# Patient Record
Sex: Male | Born: 1945
Health system: Southern US, Community
[De-identification: ages and names within clinical notes are randomized; demographics above are authoritative.]

## PROBLEM LIST (undated history)

## (undated) DIAGNOSIS — R51 Headache: Secondary | ICD-10-CM

## (undated) DIAGNOSIS — R112 Nausea with vomiting, unspecified: Secondary | ICD-10-CM

## (undated) DIAGNOSIS — E119 Type 2 diabetes mellitus without complications: Secondary | ICD-10-CM

## (undated) DIAGNOSIS — K219 Gastro-esophageal reflux disease without esophagitis: Secondary | ICD-10-CM

## (undated) DIAGNOSIS — Z860100 Personal history of colon polyps, unspecified: Secondary | ICD-10-CM

## (undated) DIAGNOSIS — E785 Hyperlipidemia, unspecified: Secondary | ICD-10-CM

## (undated) DIAGNOSIS — Z9889 Other specified postprocedural states: Secondary | ICD-10-CM

## (undated) DIAGNOSIS — I2510 Atherosclerotic heart disease of native coronary artery without angina pectoris: Secondary | ICD-10-CM

## (undated) DIAGNOSIS — I10 Essential (primary) hypertension: Secondary | ICD-10-CM

## (undated) DIAGNOSIS — R0609 Other forms of dyspnea: Secondary | ICD-10-CM

## (undated) DIAGNOSIS — I700 Atherosclerosis of aorta: Secondary | ICD-10-CM

## (undated) DIAGNOSIS — G720 Drug-induced myopathy: Secondary | ICD-10-CM

## (undated) DIAGNOSIS — Z7902 Long term (current) use of antithrombotics/antiplatelets: Secondary | ICD-10-CM

## (undated) DIAGNOSIS — I219 Acute myocardial infarction, unspecified: Secondary | ICD-10-CM

## (undated) DIAGNOSIS — I519 Heart disease, unspecified: Secondary | ICD-10-CM

## (undated) DIAGNOSIS — I5189 Other ill-defined heart diseases: Secondary | ICD-10-CM

## (undated) DIAGNOSIS — R739 Hyperglycemia, unspecified: Secondary | ICD-10-CM

## (undated) DIAGNOSIS — T466X5A Adverse effect of antihyperlipidemic and antiarteriosclerotic drugs, initial encounter: Secondary | ICD-10-CM

## (undated) DIAGNOSIS — M1990 Unspecified osteoarthritis, unspecified site: Secondary | ICD-10-CM

## (undated) DIAGNOSIS — Z9289 Personal history of other medical treatment: Secondary | ICD-10-CM

## (undated) DIAGNOSIS — R0600 Dyspnea, unspecified: Secondary | ICD-10-CM

## (undated) HISTORY — PX: CORONARY ANGIOPLASTY: SHX604

## (undated) HISTORY — PX: URETHERAL RE-IMPLANTATION: SHX2616

## (undated) HISTORY — PX: OTHER SURGICAL HISTORY: SHX169

## (undated) HISTORY — PX: SUPRAPUBIC CATHETER PLACEMENT: SHX2473

## (undated) HISTORY — PX: EYE SURGERY: SHX253

## (undated) HISTORY — PX: COLONOSCOPY: SHX174

## (undated) HISTORY — PX: PENILE PROSTHESIS IMPLANT: SHX240

## (undated) HISTORY — PX: BACK SURGERY: SHX140

## (undated) HISTORY — PX: ABDOMINAL SURGERY: SHX537

## (undated) HISTORY — PX: SHOULDER SURGERY: SHX246

---

## 1988-01-19 DIAGNOSIS — Z9289 Personal history of other medical treatment: Secondary | ICD-10-CM

## 1988-01-19 HISTORY — DX: Personal history of other medical treatment: Z92.89

## 1992-01-19 HISTORY — PX: PENILE PROSTHESIS IMPLANT: SHX240

## 2000-06-24 DIAGNOSIS — I2119 ST elevation (STEMI) myocardial infarction involving other coronary artery of inferior wall: Secondary | ICD-10-CM

## 2000-06-24 HISTORY — DX: ST elevation (STEMI) myocardial infarction involving other coronary artery of inferior wall: I21.19

## 2000-06-24 HISTORY — PX: CORONARY ANGIOPLASTY WITH STENT PLACEMENT: SHX49

## 2001-01-18 DIAGNOSIS — I219 Acute myocardial infarction, unspecified: Secondary | ICD-10-CM

## 2001-01-18 HISTORY — DX: Acute myocardial infarction, unspecified: I21.9

## 2006-11-11 ENCOUNTER — Ambulatory Visit: Payer: Self-pay | Admitting: Unknown Physician Specialty

## 2008-10-16 ENCOUNTER — Ambulatory Visit: Payer: Self-pay | Admitting: General Practice

## 2012-02-09 ENCOUNTER — Ambulatory Visit: Payer: Self-pay | Admitting: General Practice

## 2012-03-02 ENCOUNTER — Ambulatory Visit: Payer: Self-pay | Admitting: General Practice

## 2012-10-20 ENCOUNTER — Ambulatory Visit (HOSPITAL_COMMUNITY)
Admission: RE | Admit: 2012-10-20 | Discharge: 2012-10-20 | Disposition: A | Discharge status: Home / Self Care | Payer: BC Managed Care – PPO | Source: Ambulatory Visit | Attending: Anesthesiology | Admitting: Anesthesiology

## 2012-10-20 ENCOUNTER — Other Ambulatory Visit: Payer: Self-pay | Admitting: Neurosurgery

## 2012-10-20 ENCOUNTER — Encounter (HOSPITAL_COMMUNITY): Payer: Self-pay

## 2012-10-20 ENCOUNTER — Encounter (HOSPITAL_COMMUNITY)
Admission: RE | Admit: 2012-10-20 | Discharge: 2012-10-20 | Disposition: A | Discharge status: Home / Self Care | Payer: BC Managed Care – PPO | Source: Ambulatory Visit | Attending: Neurosurgery | Admitting: Neurosurgery

## 2012-10-20 DIAGNOSIS — Z01818 Encounter for other preprocedural examination: Secondary | ICD-10-CM | POA: Insufficient documentation

## 2012-10-20 DIAGNOSIS — J9819 Other pulmonary collapse: Secondary | ICD-10-CM | POA: Insufficient documentation

## 2012-10-20 DIAGNOSIS — Z01812 Encounter for preprocedural laboratory examination: Secondary | ICD-10-CM | POA: Insufficient documentation

## 2012-10-20 HISTORY — DX: Headache: R51

## 2012-10-20 HISTORY — DX: Other specified postprocedural states: R11.2

## 2012-10-20 HISTORY — DX: Acute myocardial infarction, unspecified: I21.9

## 2012-10-20 HISTORY — DX: Other specified postprocedural states: Z98.890

## 2012-10-20 HISTORY — DX: Unspecified osteoarthritis, unspecified site: M19.90

## 2012-10-20 HISTORY — DX: Essential (primary) hypertension: I10

## 2012-10-20 HISTORY — DX: Gastro-esophageal reflux disease without esophagitis: K21.9

## 2012-10-20 HISTORY — DX: Type 2 diabetes mellitus without complications: E11.9

## 2012-10-20 HISTORY — DX: Personal history of other medical treatment: Z92.89

## 2012-10-20 LAB — CBC
MCV: 87.7 fL (ref 78.0–100.0)
Platelets: 198 10*3/uL (ref 150–400)
RBC: 4.78 MIL/uL (ref 4.22–5.81)
RDW: 12.8 % (ref 11.5–15.5)
WBC: 11.2 10*3/uL — ABNORMAL HIGH (ref 4.0–10.5)

## 2012-10-20 LAB — BASIC METABOLIC PANEL
CO2: 22 mEq/L (ref 19–32)
Calcium: 9.3 mg/dL (ref 8.4–10.5)
Creatinine, Ser: 0.87 mg/dL (ref 0.50–1.35)
GFR calc Af Amer: >90 mL/min (ref >90)
GFR calc non Af Amer: 86 mL/min — ABNORMAL LOW (ref >90)
Glucose, Bld: 310 mg/dL — ABNORMAL HIGH (ref 70–99)
Potassium: 4.2 mEq/L (ref 3.5–5.1)
Sodium: 134 mEq/L — ABNORMAL LOW (ref 135–145)

## 2012-10-20 LAB — SURGICAL PCR SCREEN
MRSA, PCR: NEGATIVE
Staphylococcus aureus: NEGATIVE

## 2012-10-20 NOTE — Progress Notes (Signed)
This patient has screened at an elevated risk for obstructive sleep apnea using the STOP-Bang tool during a presurgical visit  10/20/12 1224  OBSTRUCTIVE SLEEP APNEA  Have you ever been diagnosed with sleep apnea through a sleep study? No  Do you snore loudly (loud enough to be heard through closed doors)?  1  Do you often feel tired, fatigued, or sleepy during the daytime? 0  Has anyone observed you stop breathing during your sleep? 0  Do you have, or are you being treated for high blood pressure? 1  BMI more than 35 kg/m2? 0  Age over 18 years old? 1  Neck circumference greater than 40 cm/18 inches? 1  Gender: 1  Obstructive Sleep Apnea Score 5  Score 4 or greater  Results sent to PCP

## 2012-10-20 NOTE — Progress Notes (Signed)
Primary physician - strickland Cardiologist - parachoas Stress test last year, ekg last week - will request

## 2012-10-20 NOTE — Pre-Procedure Instructions (Signed)
Joshua Ferrell  10/20/2012   Your procedure is scheduled on:  Monday, October 6th  Report to Main Entrance "A" at 0530 AM.  Call this number if you have problems the morning of surgery: [phone]   Remember:   Do not eat food or drink liquids after midnight.   Take these medicines the morning of surgery with A SIP OF WATER:    Do not wear jewelry.  Do not wear lotions, powders, or perfumes. You may wear deodorant.  Do not shave 48 hours prior to surgery. Men may shave face and neck.  Do not bring valuables to the hospital.  [facility] is not responsible  for any belongings or valuables.               Contacts, dentures or bridgework may not be worn into surgery.  Leave suitcase in the car. After surgery it may be brought to your room.  For patients admitted to the hospital, discharge time is determined by your   treatment team.               Patients discharged the day of surgery will not be allowed to drive home.   Special Instructions: Shower using CHG 2 nights before surgery and the night before surgery.  If you shower the day of surgery use CHG.  Use special wash - you have one bottle of CHG for all showers.  You should use approximately 1/3 of the bottle for each shower.   Please read over the following fact sheets that you were given: Pain Booklet, Coughing and Deep Breathing, MRSA Information and Surgical Site Infection Prevention

## 2012-10-22 MED ORDER — CEFAZOLIN SODIUM-DEXTROSE 2-3 GM-% IV SOLR
2.0000 g | INTRAVENOUS | Status: AC
  Administered 2012-10-23: 2 g via INTRAVENOUS
  Filled 2012-10-22: qty 50

## 2012-10-23 ENCOUNTER — Observation Stay (HOSPITAL_COMMUNITY)
Admission: RE | Admit: 2012-10-23 | Discharge: 2012-10-24 | Disposition: A | Discharge status: Home / Self Care | Payer: BC Managed Care – PPO | Source: Ambulatory Visit | Attending: Neurosurgery | Admitting: Neurosurgery

## 2012-10-23 ENCOUNTER — Ambulatory Visit (HOSPITAL_COMMUNITY): Payer: BC Managed Care – PPO

## 2012-10-23 ENCOUNTER — Ambulatory Visit (HOSPITAL_COMMUNITY): Payer: BC Managed Care – PPO | Admitting: Anesthesiology

## 2012-10-23 ENCOUNTER — Encounter (HOSPITAL_COMMUNITY): Payer: Self-pay | Admitting: Anesthesiology

## 2012-10-23 ENCOUNTER — Encounter (HOSPITAL_COMMUNITY)
Admission: RE | Disposition: A | Discharge status: Home / Self Care | Payer: Self-pay | Source: Ambulatory Visit | Attending: Neurosurgery

## 2012-10-23 ENCOUNTER — Encounter (HOSPITAL_COMMUNITY): Payer: Self-pay | Admitting: *Deleted

## 2012-10-23 DIAGNOSIS — M48061 Spinal stenosis, lumbar region without neurogenic claudication: Principal | ICD-10-CM | POA: Insufficient documentation

## 2012-10-23 DIAGNOSIS — K219 Gastro-esophageal reflux disease without esophagitis: Secondary | ICD-10-CM | POA: Insufficient documentation

## 2012-10-23 DIAGNOSIS — E119 Type 2 diabetes mellitus without complications: Secondary | ICD-10-CM | POA: Insufficient documentation

## 2012-10-23 DIAGNOSIS — I10 Essential (primary) hypertension: Secondary | ICD-10-CM | POA: Insufficient documentation

## 2012-10-23 HISTORY — PX: LUMBAR LAMINECTOMY/DECOMPRESSION MICRODISCECTOMY: SHX5026

## 2012-10-23 LAB — GLUCOSE, CAPILLARY
Glucose-Capillary: 274 mg/dL — ABNORMAL HIGH (ref 70–99)
Glucose-Capillary: 300 mg/dL — ABNORMAL HIGH (ref 70–99)

## 2012-10-23 SURGERY — LUMBAR LAMINECTOMY/DECOMPRESSION MICRODISCECTOMY 1 LEVEL
Anesthesia: General | Site: Back | Laterality: Bilateral | Wound class: Clean

## 2012-10-23 MED ORDER — INSULIN ASPART 100 UNIT/ML ~~LOC~~ SOLN
SUBCUTANEOUS | Status: DC | PRN
  Administered 2012-10-23: 10 [IU] via SUBCUTANEOUS

## 2012-10-23 MED ORDER — ZOLPIDEM TARTRATE 5 MG PO TABS: 5.0000 mg | ORAL_TABLET | Freq: Every evening | ORAL | Status: DC | PRN

## 2012-10-23 MED ORDER — CEFAZOLIN SODIUM 1-5 GM-% IV SOLN
1.0000 g | Freq: Three times a day (TID) | INTRAVENOUS | Status: AC
  Administered 2012-10-23 (×2): 1 g via INTRAVENOUS
  Filled 2012-10-23 (×2): qty 50

## 2012-10-23 MED ORDER — MIDAZOLAM HCL 5 MG/5ML IJ SOLN
INTRAMUSCULAR | Status: DC | PRN
  Administered 2012-10-23: 1 mg via INTRAVENOUS

## 2012-10-23 MED ORDER — NEOSTIGMINE METHYLSULFATE 1 MG/ML IJ SOLN
INTRAMUSCULAR | Status: DC | PRN
  Administered 2012-10-23: 5 mg via INTRAVENOUS

## 2012-10-23 MED ORDER — SODIUM CHLORIDE 0.9 % IJ SOLN
3.0000 mL | Freq: Two times a day (BID) | INTRAMUSCULAR | Status: DC
  Administered 2012-10-23 (×2): 3 mL via INTRAVENOUS

## 2012-10-23 MED ORDER — BISACODYL 10 MG RE SUPP: 10.0000 mg | Freq: Every day | RECTAL | Status: DC | PRN

## 2012-10-23 MED ORDER — LACTATED RINGERS IV SOLN
INTRAVENOUS | Status: DC | PRN
  Administered 2012-10-23 (×2): via INTRAVENOUS

## 2012-10-23 MED ORDER — ACETAMINOPHEN 650 MG RE SUPP: 650.0000 mg | RECTAL | Status: DC | PRN

## 2012-10-23 MED ORDER — 0.9 % SODIUM CHLORIDE (POUR BTL) OPTIME
TOPICAL | Status: DC | PRN
  Administered 2012-10-23: 1000 mL

## 2012-10-23 MED ORDER — DEXTROSE 5 % IV SOLN
500.0000 mg | Freq: Four times a day (QID) | INTRAVENOUS | Status: DC | PRN
  Filled 2012-10-23: qty 5

## 2012-10-23 MED ORDER — OXYCODONE HCL 5 MG PO TABS
5.0000 mg | ORAL_TABLET | Freq: Once | ORAL | Status: DC | PRN
  Filled 2012-10-23: qty 1

## 2012-10-23 MED ORDER — INSULIN ASPART 100 UNIT/ML ~~LOC~~ SOLN
0.0000 [IU] | SUBCUTANEOUS | Status: DC
  Administered 2012-10-23: 5 [IU] via SUBCUTANEOUS
  Administered 2012-10-23: 8 [IU] via SUBCUTANEOUS

## 2012-10-23 MED ORDER — METHOCARBAMOL 500 MG PO TABS: 500.0000 mg | ORAL_TABLET | Freq: Four times a day (QID) | ORAL | Status: DC | PRN

## 2012-10-23 MED ORDER — ONDANSETRON HCL 4 MG/2ML IJ SOLN: 4.0000 mg | Freq: Once | INTRAMUSCULAR | Status: DC | PRN

## 2012-10-23 MED ORDER — KETOROLAC TROMETHAMINE 30 MG/ML IJ SOLN
30.0000 mg | Freq: Four times a day (QID) | INTRAMUSCULAR | Status: DC
  Administered 2012-10-23 – 2012-10-24 (×3): 30 mg via INTRAVENOUS
  Filled 2012-10-23 (×4): qty 1

## 2012-10-23 MED ORDER — ACETAMINOPHEN 325 MG PO TABS: 650.0000 mg | ORAL_TABLET | ORAL | Status: DC | PRN

## 2012-10-23 MED ORDER — HYDROMORPHONE HCL PF 1 MG/ML IJ SOLN
0.2500 mg | INTRAMUSCULAR | Status: DC | PRN
  Administered 2012-10-23: 0.5 mg via INTRAVENOUS
  Filled 2012-10-23: qty 0.5

## 2012-10-23 MED ORDER — INSULIN ASPART 100 UNIT/ML ~~LOC~~ SOLN
0.0000 [IU] | Freq: Three times a day (TID) | SUBCUTANEOUS | Status: DC
  Administered 2012-10-24: 3 [IU] via SUBCUTANEOUS

## 2012-10-23 MED ORDER — LIDOCAINE-EPINEPHRINE 1 %-1:100000 IJ SOLN
INTRAMUSCULAR | Status: DC | PRN
  Administered 2012-10-23: 20 mL

## 2012-10-23 MED ORDER — ROCURONIUM BROMIDE 100 MG/10ML IV SOLN
INTRAVENOUS | Status: DC | PRN
  Administered 2012-10-23: 50 mg via INTRAVENOUS
  Administered 2012-10-23: 10 mg via INTRAVENOUS

## 2012-10-23 MED ORDER — PANTOPRAZOLE SODIUM 40 MG PO TBEC
40.0000 mg | DELAYED_RELEASE_TABLET | Freq: Two times a day (BID) | ORAL | Status: DC
  Administered 2012-10-23: 40 mg via ORAL
  Filled 2012-10-23: qty 1

## 2012-10-23 MED ORDER — METFORMIN HCL 500 MG PO TABS
1000.0000 mg | ORAL_TABLET | Freq: Two times a day (BID) | ORAL | Status: DC
  Administered 2012-10-23 – 2012-10-24 (×2): 1000 mg via ORAL
  Filled 2012-10-23 (×4): qty 2

## 2012-10-23 MED ORDER — THROMBIN 5000 UNITS EX SOLR
CUTANEOUS | Status: DC | PRN
  Administered 2012-10-23 (×2): 5000 [IU] via TOPICAL

## 2012-10-23 MED ORDER — SUFENTANIL CITRATE 50 MCG/ML IV SOLN
INTRAVENOUS | Status: DC | PRN
  Administered 2012-10-23: 5 ug via INTRAVENOUS
  Administered 2012-10-23: 20 ug via INTRAVENOUS

## 2012-10-23 MED ORDER — HYDRALAZINE HCL 20 MG/ML IJ SOLN
10.0000 mg | Freq: Once | INTRAMUSCULAR | Status: AC
  Administered 2012-10-23: 10 mg via INTRAVENOUS
  Filled 2012-10-23: qty 1

## 2012-10-23 MED ORDER — ONDANSETRON HCL 4 MG/2ML IJ SOLN
INTRAMUSCULAR | Status: DC | PRN
  Administered 2012-10-23: 4 mg via INTRAVENOUS

## 2012-10-23 MED ORDER — LACTATED RINGERS IV SOLN: INTRAVENOUS | Status: DC

## 2012-10-23 MED ORDER — PROPOFOL 10 MG/ML IV BOLUS
INTRAVENOUS | Status: DC | PRN
  Administered 2012-10-23: 160 mg via INTRAVENOUS

## 2012-10-23 MED ORDER — SODIUM CHLORIDE 0.9 % IR SOLN
Status: DC | PRN
  Administered 2012-10-23: 08:00:00

## 2012-10-23 MED ORDER — ATORVASTATIN CALCIUM 10 MG PO TABS
10.0000 mg | ORAL_TABLET | Freq: Every day | ORAL | Status: DC
  Administered 2012-10-23: 10 mg via ORAL
  Filled 2012-10-23 (×2): qty 1

## 2012-10-23 MED ORDER — LIDOCAINE HCL (CARDIAC) 20 MG/ML IV SOLN
INTRAVENOUS | Status: DC | PRN
  Administered 2012-10-23: 50 mg via INTRAVENOUS

## 2012-10-23 MED ORDER — MORPHINE SULFATE 2 MG/ML IJ SOLN: 1.0000 mg | INTRAMUSCULAR | Status: DC | PRN

## 2012-10-23 MED ORDER — SODIUM CHLORIDE 0.9 % IJ SOLN: 3.0000 mL | INTRAMUSCULAR | Status: DC | PRN

## 2012-10-23 MED ORDER — METOPROLOL SUCCINATE ER 100 MG PO TB24
100.0000 mg | ORAL_TABLET | Freq: Every day | ORAL | Status: DC
  Administered 2012-10-24: 100 mg via ORAL
  Filled 2012-10-23 (×2): qty 1

## 2012-10-23 MED ORDER — OXYCODONE HCL 5 MG/5ML PO SOLN: 5.0000 mg | Freq: Once | ORAL | Status: DC | PRN

## 2012-10-23 MED ORDER — HEMOSTATIC AGENTS (NO CHARGE) OPTIME
TOPICAL | Status: DC | PRN
  Administered 2012-10-23: 1 via TOPICAL

## 2012-10-23 MED ORDER — PROMETHAZINE HCL 25 MG/ML IJ SOLN: 12.5000 mg | INTRAMUSCULAR | Status: DC | PRN

## 2012-10-23 MED ORDER — CYCLOBENZAPRINE HCL 10 MG PO TABS: 10.0000 mg | ORAL_TABLET | Freq: Three times a day (TID) | ORAL | Status: DC | PRN

## 2012-10-23 MED ORDER — PROMETHAZINE HCL 25 MG PO TABS: 12.5000 mg | ORAL_TABLET | ORAL | Status: DC | PRN

## 2012-10-23 MED ORDER — OXYCODONE-ACETAMINOPHEN 5-325 MG PO TABS: 1.0000 | ORAL_TABLET | ORAL | Status: DC | PRN

## 2012-10-23 MED ORDER — HYDROCODONE-ACETAMINOPHEN 5-325 MG PO TABS: 1.0000 | ORAL_TABLET | ORAL | Status: DC | PRN

## 2012-10-23 MED ORDER — ONDANSETRON HCL 4 MG/2ML IJ SOLN: 4.0000 mg | INTRAMUSCULAR | Status: DC | PRN

## 2012-10-23 MED ORDER — DOCUSATE SODIUM 100 MG PO CAPS
100.0000 mg | ORAL_CAPSULE | Freq: Two times a day (BID) | ORAL | Status: DC
  Administered 2012-10-23: 100 mg via ORAL
  Filled 2012-10-23: qty 1

## 2012-10-23 MED ORDER — INSULIN ASPART 100 UNIT/ML ~~LOC~~ SOLN: 0.0000 [IU] | Freq: Every day | SUBCUTANEOUS | Status: DC

## 2012-10-23 MED ORDER — GLYCOPYRROLATE 0.2 MG/ML IJ SOLN
INTRAMUSCULAR | Status: DC | PRN
  Administered 2012-10-23: .6 mg via INTRAVENOUS

## 2012-10-23 MED ORDER — MAGNESIUM HYDROXIDE 400 MG/5ML PO SUSP: 30.0000 mL | Freq: Every day | ORAL | Status: DC | PRN

## 2012-10-23 SURGICAL SUPPLY — 54 items
BAG DECANTER FOR FLEXI CONT (MISCELLANEOUS) ×2 IMPLANT
BENZOIN TINCTURE PRP APPL 2/3 (GAUZE/BANDAGES/DRESSINGS) ×2 IMPLANT
BLADE SURG ROTATE 9660 (MISCELLANEOUS) IMPLANT
BUR ROUND FLUTED 5 RND (BURR) ×2 IMPLANT
CANISTER SUCTION 2500CC (MISCELLANEOUS) ×2 IMPLANT
CLOTH BEACON ORANGE TIMEOUT ST (SAFETY) ×2 IMPLANT
CONT SPEC 4OZ CLIKSEAL STRL BL (MISCELLANEOUS) IMPLANT
DRAPE LAPAROTOMY 100X72X124 (DRAPES) ×2 IMPLANT
DRAPE MICROSCOPE LEICA (MISCELLANEOUS) ×2 IMPLANT
DRAPE POUCH INSTRU U-SHP 10X18 (DRAPES) ×2 IMPLANT
DRAPE SURG 17X23 STRL (DRAPES) ×2 IMPLANT
DRESSING TELFA 8X3 (GAUZE/BANDAGES/DRESSINGS) ×2 IMPLANT
DURAPREP 26ML APPLICATOR (WOUND CARE) ×2 IMPLANT
ELECT REM PT RETURN 9FT ADLT (ELECTROSURGICAL) ×2
ELECTRODE REM PT RTRN 9FT ADLT (ELECTROSURGICAL) ×1 IMPLANT
GAUZE SPONGE 4X4 16PLY XRAY LF (GAUZE/BANDAGES/DRESSINGS) IMPLANT
GLOVE BIO SURGEON STRL SZ8 (GLOVE) ×2 IMPLANT
GLOVE BIOGEL PI IND STRL 7.5 (GLOVE) ×3 IMPLANT
GLOVE BIOGEL PI INDICATOR 7.5 (GLOVE) ×3
GLOVE ECLIPSE 7.5 STRL STRAW (GLOVE) ×2 IMPLANT
GLOVE EXAM NITRILE LRG STRL (GLOVE) IMPLANT
GLOVE EXAM NITRILE MD LF STRL (GLOVE) IMPLANT
GLOVE EXAM NITRILE XL STR (GLOVE) IMPLANT
GLOVE EXAM NITRILE XS STR PU (GLOVE) IMPLANT
GLOVE INDICATOR 8.5 STRL (GLOVE) ×2 IMPLANT
GLOVE OPTIFIT SS 7.5 STRL LX (GLOVE) ×6 IMPLANT
GOWN BRE IMP SLV AUR LG STRL (GOWN DISPOSABLE) ×2 IMPLANT
GOWN BRE IMP SLV AUR XL STRL (GOWN DISPOSABLE) ×4 IMPLANT
GOWN STRL REIN 2XL LVL4 (GOWN DISPOSABLE) IMPLANT
KIT BASIN OR (CUSTOM PROCEDURE TRAY) ×2 IMPLANT
KIT ROOM TURNOVER OR (KITS) ×2 IMPLANT
NEEDLE HYPO 18GX1.5 BLUNT FILL (NEEDLE) IMPLANT
NEEDLE HYPO 22GX1.5 SAFETY (NEEDLE) ×4 IMPLANT
NS IRRIG 1000ML POUR BTL (IV SOLUTION) ×2 IMPLANT
PACK LAMINECTOMY NEURO (CUSTOM PROCEDURE TRAY) ×2 IMPLANT
PAD ARMBOARD 7.5X6 YLW CONV (MISCELLANEOUS) ×6 IMPLANT
PATTIES SURGICAL .75X.75 (GAUZE/BANDAGES/DRESSINGS) ×2 IMPLANT
RUBBERBAND STERILE (MISCELLANEOUS) ×4 IMPLANT
SPONGE GAUZE 4X4 12PLY (GAUZE/BANDAGES/DRESSINGS) ×2 IMPLANT
SPONGE LAP 4X18 X RAY DECT (DISPOSABLE) IMPLANT
SPONGE SURGIFOAM ABS GEL SZ50 (HEMOSTASIS) ×2 IMPLANT
STRIP CLOSURE SKIN 1/2X4 (GAUZE/BANDAGES/DRESSINGS) ×2 IMPLANT
SUT PROLENE 6 0 BV (SUTURE) IMPLANT
SUT VIC AB 0 CT1 18XCR BRD8 (SUTURE) ×1 IMPLANT
SUT VIC AB 0 CT1 8-18 (SUTURE) ×1
SUT VIC AB 2-0 CP2 18 (SUTURE) ×2 IMPLANT
SUT VIC AB 3-0 SH 8-18 (SUTURE) ×2 IMPLANT
SYR 20ML ECCENTRIC (SYRINGE) ×2 IMPLANT
SYR 5ML LL (SYRINGE) IMPLANT
TAPE CLOTH SURG 4X10 WHT LF (GAUZE/BANDAGES/DRESSINGS) ×2 IMPLANT
TAPE STRIPS DRAPE STRL (GAUZE/BANDAGES/DRESSINGS) ×2 IMPLANT
TOWEL OR 17X24 6PK STRL BLUE (TOWEL DISPOSABLE) ×2 IMPLANT
TOWEL OR 17X26 10 PK STRL BLUE (TOWEL DISPOSABLE) ×2 IMPLANT
WATER STERILE IRR 1000ML POUR (IV SOLUTION) ×2 IMPLANT

## 2012-10-23 NOTE — Anesthesia Procedure Notes (Signed)
Procedure Name: Intubation Date/Time: 10/23/2012 7:44 AM Performed by: Coralee Rud Pre-anesthesia Checklist: Patient identified, Emergency Drugs available, Suction available and Patient being monitored Patient Re-evaluated:Patient Re-evaluated prior to inductionOxygen Delivery Method: Circle system utilized Preoxygenation: Pre-oxygenation with 100% oxygen Intubation Type: IV induction Ventilation: Mask ventilation without difficulty and Oral airway inserted - appropriate to patient size Laryngoscope Size: Miller and 3 Grade View: Grade I Tube type: Oral Tube size: 8.0 mm Number of attempts: 1 Airway Equipment and Method: Stylet Placement Confirmation: ETT inserted through vocal cords under direct vision,  positive ETCO2 and breath sounds checked- equal and bilateral Secured at: 22 cm Tube secured with: Tape Dental Injury: Teeth and Oropharynx as per pre-operative assessment

## 2012-10-23 NOTE — Plan of Care (Signed)
Problem: Consults Goal: Diagnosis - Spinal Surgery Outcome: Completed/Met Date Met:  10/23/12 Lumbar Laminectomy (Complex)

## 2012-10-23 NOTE — Progress Notes (Signed)
Wasted Dilaudid 0.5mg  - sharps with Therapist, sports

## 2012-10-23 NOTE — Transfer of Care (Signed)
Immediate Anesthesia Transfer of Care Note  Patient: Joshua Ferrell  Procedure(s) Performed: Procedure(s) with comments: LUMBAR LAMINECTOMY/DECOMPRESSION MICRODISCECTOMY 1 LEVEL (Bilateral) - LUMBAR LAMINECTOMY/DECOMPRESSION MICRODISCECTOMY 1 LEVEL  Patient Location: PACU  Anesthesia Type:General  Level of Consciousness: awake and oriented  Airway & Oxygen Therapy: Patient Spontanous Breathing and Patient connected to nasal cannula oxygen  Post-op Assessment: Report given to PACU RN  Post vital signs: Reviewed and stable  Complications: No apparent anesthesia complications

## 2012-10-23 NOTE — Interval H&P Note (Signed)
History and Physical Interval Note:  10/23/2012 6:45 AM  Joshua Ferrell  has presented today for surgery, with the diagnosis of stenosis  The various methods of treatment have been discussed with the patient and family. After consideration of risks, benefits and other options for treatment, the patient has consented to  Procedure(s): LUMBAR LAMINECTOMY/DECOMPRESSION MICRODISCECTOMY 1 LEVEL (Bilateral) as a surgical intervention .  The patient's history has been reviewed, patient examined, no change in status, stable for surgery.  I have reviewed the patient's chart and labs.  Questions were answered to the patient's satisfaction.     Sevin Farone R

## 2012-10-23 NOTE — Anesthesia Postprocedure Evaluation (Signed)
  Anesthesia Post-op Note  Patient: Joshua Ferrell  Procedure(s) Performed: Procedure(s) with comments: LUMBAR LAMINECTOMY/DECOMPRESSION MICRODISCECTOMY 1 LEVEL (Bilateral) - LUMBAR LAMINECTOMY/DECOMPRESSION MICRODISCECTOMY 1 LEVEL  Patient Location: PACU  Anesthesia Type:General  Level of Consciousness: awake, alert  and oriented  Airway and Oxygen Therapy: Patient Spontanous Breathing and Patient connected to nasal cannula oxygen  Post-op Pain: mild  Post-op Assessment: Post-op Vital signs reviewed, Patient's Cardiovascular Status Stable, Respiratory Function Stable, Patent Airway, No signs of Nausea or vomiting and Pain level controlled  Post-op Vital Signs: stable  Complications: No apparent anesthesia complications

## 2012-10-23 NOTE — Anesthesia Preprocedure Evaluation (Addendum)
Anesthesia Evaluation  Patient identified by MRN, date of birth, ID band Patient awake    Reviewed: Allergy & Precautions, H&P , NPO status , Patient's Chart, lab work & pertinent test results  History of Anesthesia Complications (+) PONV  Airway Mallampati: II      Dental  (+) Teeth Intact and Dental Advisory Given   Pulmonary neg pulmonary ROS,  breath sounds clear to auscultation        Cardiovascular hypertension, Pt. on medications and Pt. on home beta blockers + Past MI Rhythm:Regular Rate:Normal     Neuro/Psych  Headaches, @nd  to Elevated B/P negative psych ROS   GI/Hepatic Neg liver ROS, GERD-  ,  Endo/Other  diabetes, Type 2, Oral Hypoglycemic Agents  Renal/GU negative Renal ROS     Musculoskeletal negative musculoskeletal ROS (+)   Abdominal (+) + obese,   Peds  Hematology negative hematology ROS (+)   Anesthesia Other Findings   Reproductive/Obstetrics                          Anesthesia Physical Anesthesia Plan  ASA: III  Anesthesia Plan: General   Post-op Pain Management:    Induction: Intravenous  Airway Management Planned: Mask and Oral ETT  Additional Equipment:   Intra-op Plan:   Post-operative Plan: Extubation in OR  Informed Consent: I have reviewed the patients History and Physical, chart, labs and discussed the procedure including the risks, benefits and alternatives for the proposed anesthesia with the patient or authorized representative who has indicated his/her understanding and acceptance.   Dental advisory given  Plan Discussed with: CRNA and Anesthesiologist  Anesthesia Plan Comments: (CAD S/P PTCA with stent 11 years ago at [facility]. Denies angina, works full time as Technical sales engineer, ECG normal Htn Type 2 DM glucose 296  Plan GA with oral ETT  Kipp Brood, MD)       Anesthesia Quick Evaluation

## 2012-10-23 NOTE — H&P (Signed)
See H& P.

## 2012-10-23 NOTE — Preoperative (Signed)
Beta Blockers   Reason not to administer Beta Blockers:Not Applicable, Patient took BB this AM

## 2012-10-23 NOTE — Op Note (Signed)
10/23/2012  9:41 AM  PATIENT:  Joshua Ferrell  67 y.o. male  PRE-OPERATIVE DIAGNOSIS:  L2-3 stenosiswith radiculopathy  POST-OPERATIVE DIAGNOSIS:  same  PROCEDURE:  Procedure(s): Right L2-3 decompressivive laminectomy ,    Left L2-3 decompressive laminectomy , microdisection  SURGEON:  Surgeon(s): Clydene Fake, MD Mariam Dollar, MD-assist    ANESTHESIA:   general  EBL:  Total I/O In: 1000 [I.V.:1000] Out: 100 [Blood:100]  BLOOD ADMINISTERED:none  DRAINS: none   SPECIMEN:  No Specimen  DICTATION: Patient with back and leg pain trouble walking and worse to the right epidural injection was done with that didn't help for repeat MRI was done showing progressive stenosis at the L2-3 level and after discussion was decided proceed with surgical intervention and bilateral decompressive laminectomies at L. 23.  Patient brought in the operating room general anesthesia induced patient placed in a prone position Wilson frame all pressure points padded. Patient show fashion 7 incision injected with tissues were lidocaine with epinephrine was placed interspace x-rays attention needle was putting at the L2-3 interspinous. Incision was made centered over that side and extended somewhat cephalad incision taken of the fascia hemostasis obtained with position fascia was incised and subperiosteal dissection done over L2 and 3 spinous process lamina to the facet bilaterally. Soaking retractors were placed markers placed the to 3 space and x-rays obtained and this confirmed or positioning at L2-3.  Next (for microdissection high-speed drill was used to decompressive semi-hemilaminectomy medial facetectomy first on the left side and then over on the right side start back on the left side Kerrison punches were then used to remove hypertrophic ligament decompression the central canal continue the laminectomy at L2 and 3 and into medial facetectomy for decompression of nerve root we made sure that the 2  and 3 roots exited did well. Attention then taken to the right side in for decompressive laminectomy was done removing hypertrophic ligament and lamina out to the top lamina of L3 medial facetectomy decompress the central canal and the foramen over the L2 and 3 roots bilaterally. We then explored the disc space and there was no large disc protrusion she no free fragment seen we did not to do a discectomy at. We did decompression the central canal and in the 2 and 3 roots exited the foramen well. We. Solution had very good hemostasis retractors removed fascia closed 0 Vicryl interrupted sutures subcutaneous tissue closed with 020 through Vicryl interrupted sutures skin closed benzoin Steri-Strips dressing was placed patient spine position woken from anesthesia and transferred to recovery room.   PLAN OF CARE: Admit for overnight observation  PATIENT DISPOSITION:  PACU - hemodynamically stable.

## 2012-10-24 ENCOUNTER — Encounter (HOSPITAL_COMMUNITY): Payer: Self-pay | Admitting: Neurosurgery

## 2012-10-24 LAB — GLUCOSE, CAPILLARY: Glucose-Capillary: 187 mg/dL — ABNORMAL HIGH (ref 70–99)

## 2012-10-24 MED ORDER — CYCLOBENZAPRINE HCL 10 MG PO TABS: 10.0000 mg | ORAL_TABLET | Freq: Three times a day (TID) | ORAL | Status: DC | PRN

## 2012-10-24 MED ORDER — HYDROCODONE-ACETAMINOPHEN 5-325 MG PO TABS: 1.0000 | ORAL_TABLET | ORAL | Status: DC | PRN

## 2012-10-24 NOTE — Discharge Summary (Signed)
Physician Discharge Summary  Patient ID: Joshua Ferrell MRN: [id number] DOB/AGE: 69-Jul-1947 69 y.o.  Admit date: 10/23/2012 Discharge date: 10/24/2012  Admission Diagnoses:L2-3 stenosiswith radiculopathy   Discharge Diagnoses: L2-3 stenosiswith radiculopathy  Active Problems:   * No active hospital problems. *   Discharged Condition: good  Hospital Course: pt admitted on day of surgery  - underwent procedure below  - pt with no leg pain, ambulating well, voiding  - doing well  Consults: None    Treatments: surgery: Right L2-3 decompressivive laminectomy , Left L2-3 decompressive laminectomy , microdisection   Discharge Exam: Blood pressure 150/82, pulse 65, temperature 99 F (37.2 C), temperature source Oral, resp. rate 18, SpO2 95.00%. Wound:c/d/i  Disposition: home     Medication List    STOP taking these medications       predniSONE 10 MG tablet  Commonly known as:  DELTASONE      TAKE these medications       aspirin EC 81 MG tablet  Take 81 mg by mouth daily.     cyclobenzaprine 10 MG tablet  Commonly known as:  FLEXERIL  Take 1 tablet (10 mg total) by mouth 3 (three) times daily as needed for muscle spasms.     HYDROcodone-acetaminophen 5-325 MG per tablet  Commonly known as:  NORCO/VICODIN  Take 1-2 tablets by mouth every 4 (four) hours as needed.     metFORMIN 500 MG tablet  Commonly known as:  GLUCOPHAGE  Take 1,000 mg by mouth 2 (two) times daily with a meal.     metoprolol succinate 100 MG 24 hr tablet  Commonly known as:  TOPROL-XL  Take 100 mg by mouth daily. Take with or immediately following a meal.     pantoprazole 40 MG tablet  Commonly known as:  PROTONIX  Take 40 mg by mouth 2 (two) times daily.     rosuvastatin 5 MG tablet  Commonly known as:  CRESTOR  Take 5 mg by mouth daily.         SignedClydene Fake, MD 10/24/2012, 8:39 AM

## 2012-10-24 NOTE — Progress Notes (Signed)
Pt doing very well. Pt and son given D/C instructions with Rx's, verbal understanding was given. Pt D/C'd home via wheelchair @ [phone] per MD order. Rema Fendt, RN

## 2013-08-22 DIAGNOSIS — I252 Old myocardial infarction: Secondary | ICD-10-CM | POA: Insufficient documentation

## 2013-08-22 DIAGNOSIS — E785 Hyperlipidemia, unspecified: Secondary | ICD-10-CM | POA: Insufficient documentation

## 2015-01-21 DIAGNOSIS — E119 Type 2 diabetes mellitus without complications: Secondary | ICD-10-CM | POA: Diagnosis not present

## 2015-01-21 DIAGNOSIS — I2510 Atherosclerotic heart disease of native coronary artery without angina pectoris: Secondary | ICD-10-CM | POA: Diagnosis not present

## 2015-01-21 DIAGNOSIS — E7800 Pure hypercholesterolemia, unspecified: Secondary | ICD-10-CM | POA: Diagnosis not present

## 2015-06-05 DIAGNOSIS — H35032 Hypertensive retinopathy, left eye: Secondary | ICD-10-CM | POA: Diagnosis not present

## 2015-06-05 DIAGNOSIS — E119 Type 2 diabetes mellitus without complications: Secondary | ICD-10-CM | POA: Diagnosis not present

## 2015-06-05 DIAGNOSIS — H2513 Age-related nuclear cataract, bilateral: Secondary | ICD-10-CM | POA: Diagnosis not present

## 2015-06-05 DIAGNOSIS — I10 Essential (primary) hypertension: Secondary | ICD-10-CM | POA: Diagnosis not present

## 2015-07-17 DIAGNOSIS — H2513 Age-related nuclear cataract, bilateral: Secondary | ICD-10-CM | POA: Diagnosis not present

## 2015-07-24 DIAGNOSIS — H2513 Age-related nuclear cataract, bilateral: Secondary | ICD-10-CM | POA: Diagnosis not present

## 2015-07-30 ENCOUNTER — Encounter: Payer: Self-pay | Admitting: *Deleted

## 2015-08-10 NOTE — H&P (Signed)
See scanned note.

## 2015-08-11 MED ORDER — SODIUM CHLORIDE 0.9 % IV SOLN
INTRAVENOUS | Status: DC
  Administered 2015-08-13: 07:00:00 via INTRAVENOUS

## 2015-08-11 MED ORDER — CYCLOPENTOLATE HCL 2 % OP SOLN
1.0000 [drp] | OPHTHALMIC | Status: AC | PRN
  Administered 2015-08-13 (×4): 1 [drp] via OPHTHALMIC

## 2015-08-11 MED ORDER — PHENYLEPHRINE HCL 10 % OP SOLN
1.0000 [drp] | OPHTHALMIC | Status: AC | PRN
  Administered 2015-08-13 (×4): 1 [drp] via OPHTHALMIC

## 2015-08-11 MED ORDER — MOXIFLOXACIN HCL 0.5 % OP SOLN
1.0000 [drp] | OPHTHALMIC | Status: AC | PRN
  Administered 2015-08-13 (×3): 1 [drp] via OPHTHALMIC

## 2015-08-13 ENCOUNTER — Ambulatory Visit: Payer: PPO | Admitting: Registered Nurse

## 2015-08-13 ENCOUNTER — Encounter: Payer: Self-pay | Admitting: *Deleted

## 2015-08-13 ENCOUNTER — Encounter
Admission: RE | Disposition: A | Discharge status: Home / Self Care | Payer: Self-pay | Source: Ambulatory Visit | Attending: Ophthalmology

## 2015-08-13 ENCOUNTER — Ambulatory Visit
Admission: RE | Admit: 2015-08-13 | Discharge: 2015-08-13 | Disposition: A | Discharge status: Home / Self Care | Payer: PPO | Source: Ambulatory Visit | Attending: Ophthalmology | Admitting: Ophthalmology

## 2015-08-13 DIAGNOSIS — H2511 Age-related nuclear cataract, right eye: Secondary | ICD-10-CM | POA: Diagnosis not present

## 2015-08-13 DIAGNOSIS — Z955 Presence of coronary angioplasty implant and graft: Secondary | ICD-10-CM | POA: Diagnosis not present

## 2015-08-13 DIAGNOSIS — E7800 Pure hypercholesterolemia, unspecified: Secondary | ICD-10-CM | POA: Insufficient documentation

## 2015-08-13 DIAGNOSIS — Z886 Allergy status to analgesic agent status: Secondary | ICD-10-CM | POA: Diagnosis not present

## 2015-08-13 DIAGNOSIS — H25041 Posterior subcapsular polar age-related cataract, right eye: Secondary | ICD-10-CM | POA: Diagnosis not present

## 2015-08-13 DIAGNOSIS — I252 Old myocardial infarction: Secondary | ICD-10-CM | POA: Diagnosis not present

## 2015-08-13 DIAGNOSIS — I10 Essential (primary) hypertension: Secondary | ICD-10-CM | POA: Insufficient documentation

## 2015-08-13 DIAGNOSIS — Z79899 Other long term (current) drug therapy: Secondary | ICD-10-CM | POA: Diagnosis not present

## 2015-08-13 DIAGNOSIS — E119 Type 2 diabetes mellitus without complications: Secondary | ICD-10-CM | POA: Diagnosis not present

## 2015-08-13 DIAGNOSIS — Z7982 Long term (current) use of aspirin: Secondary | ICD-10-CM | POA: Diagnosis not present

## 2015-08-13 DIAGNOSIS — F17220 Nicotine dependence, chewing tobacco, uncomplicated: Secondary | ICD-10-CM | POA: Insufficient documentation

## 2015-08-13 DIAGNOSIS — Z9889 Other specified postprocedural states: Secondary | ICD-10-CM | POA: Insufficient documentation

## 2015-08-13 DIAGNOSIS — H269 Unspecified cataract: Secondary | ICD-10-CM | POA: Diagnosis not present

## 2015-08-13 DIAGNOSIS — K219 Gastro-esophageal reflux disease without esophagitis: Secondary | ICD-10-CM | POA: Diagnosis not present

## 2015-08-13 DIAGNOSIS — Z7984 Long term (current) use of oral hypoglycemic drugs: Secondary | ICD-10-CM | POA: Diagnosis not present

## 2015-08-13 DIAGNOSIS — H2513 Age-related nuclear cataract, bilateral: Secondary | ICD-10-CM | POA: Diagnosis not present

## 2015-08-13 HISTORY — PX: CATARACT EXTRACTION W/PHACO: SHX586

## 2015-08-13 LAB — GLUCOSE, CAPILLARY: Glucose-Capillary: 228 mg/dL — ABNORMAL HIGH (ref 65–99)

## 2015-08-13 SURGERY — PHACOEMULSIFICATION, CATARACT, WITH IOL INSERTION
Anesthesia: General | Site: Eye | Laterality: Right | Wound class: Clean

## 2015-08-13 MED ORDER — MOXIFLOXACIN HCL 0.5 % OP SOLN
OPHTHALMIC | Status: AC
  Administered 2015-08-13: 06:00:00 1 [drp] via OPHTHALMIC
  Filled 2015-08-13: qty 3

## 2015-08-13 MED ORDER — TETRACAINE HCL 0.5 % OP SOLN
OPHTHALMIC | Status: DC | PRN
  Administered 2015-08-13: 1 [drp] via OPHTHALMIC

## 2015-08-13 MED ORDER — MIDAZOLAM HCL 2 MG/2ML IJ SOLN
INTRAMUSCULAR | Status: DC | PRN
  Administered 2015-08-13: 1 mg via INTRAVENOUS

## 2015-08-13 MED ORDER — ALFENTANIL 500 MCG/ML IJ INJ
INJECTION | INTRAMUSCULAR | Status: DC | PRN
  Administered 2015-08-13: 500 ug via INTRAVENOUS

## 2015-08-13 MED ORDER — LIDOCAINE HCL (PF) 4 % IJ SOLN
INTRAOCULAR | Status: DC | PRN
  Administered 2015-08-13: .5 mL via OPHTHALMIC

## 2015-08-13 MED ORDER — BUPIVACAINE HCL (PF) 0.75 % IJ SOLN
INTRAMUSCULAR | Status: AC
  Filled 2015-08-13: qty 10

## 2015-08-13 MED ORDER — HYALURONIDASE HUMAN 150 UNIT/ML IJ SOLN
INTRAMUSCULAR | Status: AC
  Filled 2015-08-13: qty 1

## 2015-08-13 MED ORDER — CEFUROXIME OPHTHALMIC INJECTION 1 MG/0.1 ML
INJECTION | OPHTHALMIC | Status: AC
  Filled 2015-08-13: qty 0.1

## 2015-08-13 MED ORDER — POVIDONE-IODINE 5 % OP SOLN
OPHTHALMIC | Status: AC
  Filled 2015-08-13: qty 30

## 2015-08-13 MED ORDER — LIDOCAINE HCL (PF) 4 % IJ SOLN
INTRAMUSCULAR | Status: DC | PRN
  Administered 2015-08-13: 4 mL via OPHTHALMIC

## 2015-08-13 MED ORDER — POVIDONE-IODINE 5 % OP SOLN
OPHTHALMIC | Status: DC | PRN
  Administered 2015-08-13: 1 via OPHTHALMIC

## 2015-08-13 MED ORDER — LIDOCAINE HCL (PF) 4 % IJ SOLN
INTRAMUSCULAR | Status: AC
  Filled 2015-08-13: qty 5

## 2015-08-13 MED ORDER — PHENYLEPHRINE HCL 10 % OP SOLN
OPHTHALMIC | Status: AC
  Administered 2015-08-13: 1 [drp] via OPHTHALMIC
  Filled 2015-08-13: qty 5

## 2015-08-13 MED ORDER — MOXIFLOXACIN HCL 0.5 % OP SOLN
OPHTHALMIC | Status: DC | PRN
  Administered 2015-08-13: 1 [drp] via OPHTHALMIC

## 2015-08-13 MED ORDER — NA CHONDROIT SULF-NA HYALURON 40-17 MG/ML IO SOLN
INTRAOCULAR | Status: DC | PRN
  Administered 2015-08-13: 1 mL via INTRAOCULAR

## 2015-08-13 MED ORDER — EPINEPHRINE HCL 1 MG/ML IJ SOLN
INTRAMUSCULAR | Status: AC
  Filled 2015-08-13: qty 2

## 2015-08-13 MED ORDER — ONDANSETRON HCL 4 MG/2ML IJ SOLN
INTRAMUSCULAR | Status: DC | PRN
  Administered 2015-08-13: 4 mg via INTRAVENOUS

## 2015-08-13 MED ORDER — NA CHONDROIT SULF-NA HYALURON 40-17 MG/ML IO SOLN
INTRAOCULAR | Status: AC
  Filled 2015-08-13: qty 1

## 2015-08-13 MED ORDER — CEFUROXIME OPHTHALMIC INJECTION 1 MG/0.1 ML
INJECTION | OPHTHALMIC | Status: DC | PRN
  Administered 2015-08-13: .1 mL via INTRACAMERAL

## 2015-08-13 MED ORDER — CYCLOPENTOLATE HCL 2 % OP SOLN
OPHTHALMIC | Status: AC
  Administered 2015-08-13: 1 [drp] via OPHTHALMIC
  Filled 2015-08-13: qty 2

## 2015-08-13 MED ORDER — CARBACHOL 0.01 % IO SOLN
INTRAOCULAR | Status: DC | PRN
  Administered 2015-08-13: .5 mL via INTRAOCULAR

## 2015-08-13 MED ORDER — TETRACAINE HCL 0.5 % OP SOLN
OPHTHALMIC | Status: AC
  Filled 2015-08-13: qty 2

## 2015-08-13 MED ORDER — EPINEPHRINE HCL 1 MG/ML IJ SOLN
INTRAOCULAR | Status: DC | PRN
  Administered 2015-08-13: 1 mL via OPHTHALMIC

## 2015-08-13 SURGICAL SUPPLY — 29 items

## 2015-08-13 NOTE — Transfer of Care (Signed)
Immediate Anesthesia Transfer of Care Note  Patient: Lakeith Shambaugh  Procedure(s) Performed: Procedure(s) with comments: CATARACT EXTRACTION PHACO AND INTRAOCULAR LENS PLACEMENT (IOC) (Right) - [place] 01:08 AP% 24.9 CDE 32.40 fluid pack lot # [id number]  Patient Location: PHASE II  Anesthesia Type:MAC  Level of Consciousness: Awake, Alert, Oriented  Airway & Oxygen Therapy: Patient Spontanous Breathing and Patient on room air   Post-op Assessment: Report given to RN and Post -op Vital signs reviewed and stable  Post vital signs: Reviewed and stable  Last Vitals:  Vitals:   08/13/15 0613 08/13/15 0819  BP: (!) 169/92 (!) 145/72  Pulse: 64 (!) 59  Resp: 18 12  Temp: 36.6 C [id number] C    Complications: No apparent anesthesia complications

## 2015-08-13 NOTE — Interval H&P Note (Signed)
History and Physical Interval Note:  08/13/2015 7:20 AM  Joshua Ferrell  has presented today for surgery, with the diagnosis of CATARACT  The various methods of treatment have been discussed with the patient and family. After consideration of risks, benefits and other options for treatment, the patient has consented to  Procedure(s) with comments: CATARACT EXTRACTION PHACO AND INTRAOCULAR LENS PLACEMENT (IOC) (Right) - [place] AP% CDE fluid pack lot # [id number] as a surgical intervention .  The patient's history has been reviewed, patient examined, no change in status, stable for surgery.  I have reviewed the patient's chart and labs.  Questions were answered to the patient's satisfaction.     Maxx Pham

## 2015-08-13 NOTE — Anesthesia Procedure Notes (Signed)
Procedure Name: MAC Date/Time: 08/13/2015 7:30 AM Performed by: Doreen Salvage Pre-anesthesia Checklist: Patient identified, Emergency Drugs available, Suction available and Patient being monitored Patient Re-evaluated:Patient Re-evaluated prior to inductionOxygen Delivery Method: Nasal cannula

## 2015-08-13 NOTE — Discharge Instructions (Addendum)
See handout. ° °Cataract Surgery, Care After °Refer to this sheet in the next few weeks. These instructions provide you with information on caring for yourself after your procedure. Your caregiver may also give you more specific instructions. Your treatment has been planned according to current medical practices, but problems sometimes occur. Call your caregiver if you have any problems or questions after your procedure.  °HOME CARE INSTRUCTIONS  °· Avoid strenuous activities as directed by your caregiver. °· Ask your caregiver when you can resume driving. °· Use eyedrops or other medicines to help healing and control pressure inside your eye as directed by your caregiver. °· Only take over-the-counter or prescription medicines for pain, discomfort, or fever as directed by your caregiver. °· Do not to touch or rub your eyes. °· You may be instructed to use a protective shield during the first few days and nights after surgery. If not, wear sunglasses to protect your eyes. This is to protect the eye from pressure or from being accidentally bumped. °· Keep the area around your eye clean and dry. Avoid swimming or allowing water to hit you directly in the face while showering. Keep soap and shampoo out of your eyes. °· Do not bend or lift heavy objects. Bending increases pressure in the eye. You can walk, climb stairs, and do light household chores. °· Do not put a contact lens into the eye that had surgery until your caregiver says it is okay to do so. °· Ask your doctor when you can return to work. This will depend on the kind of work that you do. If you work in a dusty environment, you may be advised to wear protective eyewear for a period of time. °· Ask your caregiver when it will be safe to engage in sexual activity. °· Continue with your regular eye exams as directed by your caregiver. °What to expect: °· It is normal to feel itching and mild discomfort for a few days after cataract surgery. Some fluid discharge  is also common, and your eye may be sensitive to light and touch. °· After 1 to 2 days, even moderate discomfort should disappear. In most cases, healing will take about 6 weeks. °· If you received an intraocular lens (IOL), you may notice that colors are very bright or have a blue tinge. Also, if you have been in bright sunlight, everything may appear reddish for a few hours. If you see these color tinges, it is because your lens is clear and no longer cloudy. Within a few months after receiving an IOL, these extra colors should go away. When you have healed, you will probably need new glasses. °SEEK MEDICAL CARE IF:  °· You have increased bruising around your eye. °· You have discomfort not helped by medicine. °SEEK IMMEDIATE MEDICAL CARE IF:  °· You have a  fever. °· You have a worsening or sudden vision loss. °· You have redness, swelling, or increasing pain in the eye. °· You have a thick discharge from the eye that had surgery. °MAKE SURE YOU: °· Understand these instructions. °· Will watch your condition. °· Will get help right away if you are not doing well or get worse. °  °This information is not intended to replace advice given to you by your health care provider. Make sure you discuss any questions you have with your health care provider. °  °Document Released: 07/24/2004 Document Revised: 01/25/2014 Document Reviewed: 08/28/2010 °Elsevier Interactive Patient Education ©2016 [employer] Inc[employer] ° °

## 2015-08-13 NOTE — Op Note (Signed)
Date of Surgery: 08/13/2015 Date of Dictation: 08/13/2015 8:15 AM Pre-operative Diagnosis:  Nuclear Sclerotic Cataract and Posterior Subcapsular Cataract right Eye Post-operative Diagnosis: same Procedure performed: Extra-capsular Cataract Extraction (ECCE) with placement of a posterior chamber intraocular lens (IOL) right Eye IOL:  Implant Name Type Inv. Item Serial No. Manufacturer Lot No. LRB No. Used  LENS IOL ACRYSOF IQ 21.5 - [id number] Intraocular Lens LENS IOL ACRYSOF IQ 21.5 [id number] ALCON   Right 1   Anesthesia: 2% Lidocaine and 4% Marcaine in a 50/50 mixture with 10 unites/ml of Hylenex given as a peribulbar Anesthesiologist: Anesthesiologist: Molli Barrows, MD CRNA: Doreen Salvage, CRNA Complications: none Estimated Blood Loss: less than 1 ml  Description of procedure:  The patient was given anesthesia and sedation via intravenous access. The patient was then prepped and draped in the usual fashion. A 25-gauge needle was bent for initiating the capsulorhexis. A 5-0 silk suture was placed through the conjunctiva superior and inferiorly to serve as bridle sutures. Hemostasis was obtained at the superior limbus using an eraser cautery. A partial thickness groove was made at the anterior surgical limbus with a 64 Beaver blade and this was dissected anteriorly with an Avaya. The anterior chamber was entered at 10 o'clock with a 1.0 mm paracentesis knife and through the lamellar dissection with a 2.6 mm Alcon keratome. Epi-Shugarcaine 0.5 CC [9 cc BSS Plus (Alcon), 3 cc 4% preservative-free lidocaine (Hospira) and 4 cc 1:1000 preservative-free, bisulfite-free epinephrine] was injected into the anterior chamber via the paracentesis tract. Epi-Shugarcaine 0.5 CC [9 cc BSS Plus (Alcon), 3 cc 4% preservative-free lidocaine (Hospira) and 4 cc 1:1000 preservative-free, bisulfite-free epinephrine] was injected into the anterior chamber via the paracentesis tract. DiscoVisc was  injected to replace the aqueous and a continuous tear curvilinear capsulorhexis was performed using a bent 25-gauge needle.  Balance salt on a syringe was used to perform hydro-dissection and phacoemulsification was carried out using a divide and conquer technique. Procedure(s) with comments: CATARACT EXTRACTION PHACO AND INTRAOCULAR LENS PLACEMENT (IOC) (Right) - [place] 01:08 AP% 24.9 CDE 32.40 fluid pack lot # [id number]. Irrigation/aspiration was used to remove the residual cortex and the capsular bag was inflated with DiscoVisc. The intraocular lens was inserted into the capsular bag using a pre-loaded UltraSert Delivery System. Irrigation/aspiration was used to remove the residual DiscoVisc. The wound was inflated with balanced salt and checked for leaks. None were found. Miostat was injected via the paracentesis track and 0.1 ml of cefuroxime containing 1 mg of drug  was injected via the paracentesis track. The wound was checked for leaks again and none were found.   The bridal sutures were removed and two drops of Vigamox were placed on the eye. An eye shield was placed to protect the eye and the patient was discharged to the recovery area in good condition.   Ladon Heney MD

## 2015-08-13 NOTE — Anesthesia Postprocedure Evaluation (Signed)
Anesthesia Post Note  Patient: Viral Paneto  Procedure(s) Performed: Procedure(s) (LRB): CATARACT EXTRACTION PHACO AND INTRAOCULAR LENS PLACEMENT (IOC) (Right)  Patient location during evaluation: PACU Anesthesia Type: General Level of consciousness: awake and alert Pain management: pain level controlled Vital Signs Assessment: post-procedure vital signs reviewed and stable Respiratory status: spontaneous breathing, nonlabored ventilation, respiratory function stable and patient connected to nasal cannula oxygen Cardiovascular status: blood pressure returned to baseline and stable Postop Assessment: no signs of nausea or vomiting Anesthetic complications: no    Last Vitals:  Vitals:   08/13/15 0819 08/13/15 0831  BP: (!) 145/72 (!) 142/83  Pulse: (!) 59 62  Resp: 12 14  Temp: 36.1 C     Last Pain:  Vitals:   08/13/15 0831  TempSrc:   PainSc: 0-No pain                 Molli Barrows

## 2015-08-13 NOTE — Anesthesia Preprocedure Evaluation (Signed)
Anesthesia Evaluation  Patient identified by MRN, date of birth, ID band Patient awake    Reviewed: Allergy & Precautions, H&P , NPO status , Patient's Chart, lab work & pertinent test results, reviewed documented beta blocker date and time   History of Anesthesia Complications (+) PONV and history of anesthetic complications  Airway Mallampati: III  TM Distance: >3 FB Neck ROM: full    Dental no notable dental hx. (+) Teeth Intact   Pulmonary neg pulmonary ROS,    Pulmonary exam normal breath sounds clear to auscultation       Cardiovascular Exercise Tolerance: Good hypertension, + Past MI and + Cardiac Stents  negative cardio ROS   Rhythm:regular Rate:Normal     Neuro/Psych  Headaches, negative neurological ROS  negative psych ROS   GI/Hepatic negative GI ROS, Neg liver ROS, GERD  ,  Endo/Other  negative endocrine ROSdiabetes  Renal/GU      Musculoskeletal   Abdominal   Peds  Hematology negative hematology ROS (+)   Anesthesia Other Findings   Reproductive/Obstetrics negative OB ROS                             Anesthesia Physical Anesthesia Plan  ASA: III  Anesthesia Plan: MAC   Post-op Pain Management:    Induction:   Airway Management Planned:   Additional Equipment:   Intra-op Plan:   Post-operative Plan:   Informed Consent: I have reviewed the patients History and Physical, chart, labs and discussed the procedure including the risks, benefits and alternatives for the proposed anesthesia with the patient or authorized representative who has indicated his/her understanding and acceptance.     Plan Discussed with: CRNA  Anesthesia Plan Comments:         Anesthesia Quick Evaluation

## 2015-08-14 ENCOUNTER — Encounter: Payer: Self-pay | Admitting: Ophthalmology

## 2016-01-02 DIAGNOSIS — E1140 Type 2 diabetes mellitus with diabetic neuropathy, unspecified: Secondary | ICD-10-CM | POA: Diagnosis not present

## 2016-01-02 DIAGNOSIS — E1165 Type 2 diabetes mellitus with hyperglycemia: Secondary | ICD-10-CM | POA: Diagnosis not present

## 2016-01-02 DIAGNOSIS — I2510 Atherosclerotic heart disease of native coronary artery without angina pectoris: Secondary | ICD-10-CM | POA: Diagnosis not present

## 2016-01-26 DIAGNOSIS — M25512 Pain in left shoulder: Secondary | ICD-10-CM | POA: Diagnosis not present

## 2016-01-26 DIAGNOSIS — M25511 Pain in right shoulder: Secondary | ICD-10-CM | POA: Diagnosis not present

## 2016-01-26 DIAGNOSIS — G8929 Other chronic pain: Secondary | ICD-10-CM | POA: Diagnosis not present

## 2016-01-26 DIAGNOSIS — M150 Primary generalized (osteo)arthritis: Secondary | ICD-10-CM | POA: Diagnosis not present

## 2016-02-06 DIAGNOSIS — Z0000 Encounter for general adult medical examination without abnormal findings: Secondary | ICD-10-CM | POA: Diagnosis not present

## 2016-02-06 DIAGNOSIS — E119 Type 2 diabetes mellitus without complications: Secondary | ICD-10-CM | POA: Diagnosis not present

## 2016-02-06 DIAGNOSIS — I2510 Atherosclerotic heart disease of native coronary artery without angina pectoris: Secondary | ICD-10-CM | POA: Diagnosis not present

## 2016-03-22 DIAGNOSIS — I2119 ST elevation (STEMI) myocardial infarction involving other coronary artery of inferior wall: Secondary | ICD-10-CM | POA: Diagnosis not present

## 2016-03-22 DIAGNOSIS — E785 Hyperlipidemia, unspecified: Secondary | ICD-10-CM | POA: Diagnosis not present

## 2016-03-22 DIAGNOSIS — Z789 Other specified health status: Secondary | ICD-10-CM | POA: Diagnosis not present

## 2016-03-22 DIAGNOSIS — I2510 Atherosclerotic heart disease of native coronary artery without angina pectoris: Secondary | ICD-10-CM | POA: Diagnosis not present

## 2016-03-22 DIAGNOSIS — E1121 Type 2 diabetes mellitus with diabetic nephropathy: Secondary | ICD-10-CM | POA: Diagnosis not present

## 2016-04-15 DIAGNOSIS — Z961 Presence of intraocular lens: Secondary | ICD-10-CM | POA: Diagnosis not present

## 2016-04-22 DIAGNOSIS — J042 Acute laryngotracheitis: Secondary | ICD-10-CM | POA: Diagnosis not present

## 2016-06-28 DIAGNOSIS — E119 Type 2 diabetes mellitus without complications: Secondary | ICD-10-CM | POA: Diagnosis not present

## 2016-06-28 DIAGNOSIS — I2510 Atherosclerotic heart disease of native coronary artery without angina pectoris: Secondary | ICD-10-CM | POA: Diagnosis not present

## 2016-06-28 DIAGNOSIS — Z0000 Encounter for general adult medical examination without abnormal findings: Secondary | ICD-10-CM | POA: Diagnosis not present

## 2016-06-30 DIAGNOSIS — I2510 Atherosclerotic heart disease of native coronary artery without angina pectoris: Secondary | ICD-10-CM | POA: Diagnosis not present

## 2016-06-30 DIAGNOSIS — E785 Hyperlipidemia, unspecified: Secondary | ICD-10-CM | POA: Diagnosis not present

## 2016-06-30 DIAGNOSIS — E1121 Type 2 diabetes mellitus with diabetic nephropathy: Secondary | ICD-10-CM | POA: Diagnosis not present

## 2016-10-04 DIAGNOSIS — I10 Essential (primary) hypertension: Secondary | ICD-10-CM | POA: Diagnosis not present

## 2016-10-04 DIAGNOSIS — Z789 Other specified health status: Secondary | ICD-10-CM | POA: Diagnosis not present

## 2016-10-04 DIAGNOSIS — I2510 Atherosclerotic heart disease of native coronary artery without angina pectoris: Secondary | ICD-10-CM | POA: Diagnosis not present

## 2016-10-04 DIAGNOSIS — E785 Hyperlipidemia, unspecified: Secondary | ICD-10-CM | POA: Diagnosis not present

## 2016-10-04 DIAGNOSIS — E1121 Type 2 diabetes mellitus with diabetic nephropathy: Secondary | ICD-10-CM | POA: Diagnosis not present

## 2016-10-22 DIAGNOSIS — E1121 Type 2 diabetes mellitus with diabetic nephropathy: Secondary | ICD-10-CM | POA: Diagnosis not present

## 2016-10-22 DIAGNOSIS — I2510 Atherosclerotic heart disease of native coronary artery without angina pectoris: Secondary | ICD-10-CM | POA: Diagnosis not present

## 2016-10-22 DIAGNOSIS — E785 Hyperlipidemia, unspecified: Secondary | ICD-10-CM | POA: Diagnosis not present

## 2016-10-29 DIAGNOSIS — E1121 Type 2 diabetes mellitus with diabetic nephropathy: Secondary | ICD-10-CM | POA: Diagnosis not present

## 2016-10-29 DIAGNOSIS — I10 Essential (primary) hypertension: Secondary | ICD-10-CM | POA: Diagnosis not present

## 2016-10-29 DIAGNOSIS — I2510 Atherosclerotic heart disease of native coronary artery without angina pectoris: Secondary | ICD-10-CM | POA: Diagnosis not present

## 2016-10-29 DIAGNOSIS — E785 Hyperlipidemia, unspecified: Secondary | ICD-10-CM | POA: Diagnosis not present

## 2016-10-29 DIAGNOSIS — Z23 Encounter for immunization: Secondary | ICD-10-CM | POA: Diagnosis not present

## 2017-01-03 DIAGNOSIS — I2510 Atherosclerotic heart disease of native coronary artery without angina pectoris: Secondary | ICD-10-CM | POA: Diagnosis not present

## 2017-03-08 DIAGNOSIS — E1121 Type 2 diabetes mellitus with diabetic nephropathy: Secondary | ICD-10-CM | POA: Diagnosis not present

## 2017-03-08 DIAGNOSIS — I2510 Atherosclerotic heart disease of native coronary artery without angina pectoris: Secondary | ICD-10-CM | POA: Diagnosis not present

## 2017-03-08 DIAGNOSIS — E785 Hyperlipidemia, unspecified: Secondary | ICD-10-CM | POA: Diagnosis not present

## 2017-03-08 DIAGNOSIS — I10 Essential (primary) hypertension: Secondary | ICD-10-CM | POA: Diagnosis not present

## 2017-03-15 DIAGNOSIS — I10 Essential (primary) hypertension: Secondary | ICD-10-CM | POA: Diagnosis not present

## 2017-03-15 DIAGNOSIS — I2510 Atherosclerotic heart disease of native coronary artery without angina pectoris: Secondary | ICD-10-CM | POA: Diagnosis not present

## 2017-03-15 DIAGNOSIS — E785 Hyperlipidemia, unspecified: Secondary | ICD-10-CM | POA: Diagnosis not present

## 2017-03-15 DIAGNOSIS — R0602 Shortness of breath: Secondary | ICD-10-CM | POA: Diagnosis not present

## 2017-03-15 DIAGNOSIS — E1121 Type 2 diabetes mellitus with diabetic nephropathy: Secondary | ICD-10-CM | POA: Diagnosis not present

## 2017-03-18 DIAGNOSIS — R0602 Shortness of breath: Secondary | ICD-10-CM | POA: Diagnosis not present

## 2017-03-30 DIAGNOSIS — E1121 Type 2 diabetes mellitus with diabetic nephropathy: Secondary | ICD-10-CM | POA: Diagnosis not present

## 2017-03-30 DIAGNOSIS — Z86010 Personal history of colonic polyps: Secondary | ICD-10-CM | POA: Diagnosis not present

## 2017-04-01 DIAGNOSIS — G8929 Other chronic pain: Secondary | ICD-10-CM | POA: Diagnosis not present

## 2017-04-01 DIAGNOSIS — M7551 Bursitis of right shoulder: Secondary | ICD-10-CM | POA: Diagnosis not present

## 2017-04-01 DIAGNOSIS — M25511 Pain in right shoulder: Secondary | ICD-10-CM | POA: Diagnosis not present

## 2017-04-05 DIAGNOSIS — Z789 Other specified health status: Secondary | ICD-10-CM | POA: Diagnosis not present

## 2017-04-05 DIAGNOSIS — I2119 ST elevation (STEMI) myocardial infarction involving other coronary artery of inferior wall: Secondary | ICD-10-CM | POA: Diagnosis not present

## 2017-04-05 DIAGNOSIS — I10 Essential (primary) hypertension: Secondary | ICD-10-CM | POA: Diagnosis not present

## 2017-04-05 DIAGNOSIS — E1121 Type 2 diabetes mellitus with diabetic nephropathy: Secondary | ICD-10-CM | POA: Diagnosis not present

## 2017-04-05 DIAGNOSIS — I2510 Atherosclerotic heart disease of native coronary artery without angina pectoris: Secondary | ICD-10-CM | POA: Diagnosis not present

## 2017-04-05 DIAGNOSIS — E785 Hyperlipidemia, unspecified: Secondary | ICD-10-CM | POA: Diagnosis not present

## 2017-06-30 DIAGNOSIS — M47816 Spondylosis without myelopathy or radiculopathy, lumbar region: Secondary | ICD-10-CM | POA: Diagnosis not present

## 2017-06-30 DIAGNOSIS — M5442 Lumbago with sciatica, left side: Secondary | ICD-10-CM | POA: Diagnosis not present

## 2017-06-30 DIAGNOSIS — E1121 Type 2 diabetes mellitus with diabetic nephropathy: Secondary | ICD-10-CM | POA: Diagnosis not present

## 2017-06-30 DIAGNOSIS — M5441 Lumbago with sciatica, right side: Secondary | ICD-10-CM | POA: Diagnosis not present

## 2017-07-20 DIAGNOSIS — E1121 Type 2 diabetes mellitus with diabetic nephropathy: Secondary | ICD-10-CM | POA: Diagnosis not present

## 2017-07-20 DIAGNOSIS — I10 Essential (primary) hypertension: Secondary | ICD-10-CM | POA: Diagnosis not present

## 2017-07-20 DIAGNOSIS — E785 Hyperlipidemia, unspecified: Secondary | ICD-10-CM | POA: Diagnosis not present

## 2017-07-20 DIAGNOSIS — I2510 Atherosclerotic heart disease of native coronary artery without angina pectoris: Secondary | ICD-10-CM | POA: Diagnosis not present

## 2017-08-23 ENCOUNTER — Encounter: Payer: Self-pay | Admitting: *Deleted

## 2017-08-24 ENCOUNTER — Encounter
Admission: RE | Disposition: A | Discharge status: Home / Self Care | Payer: Self-pay | Source: Ambulatory Visit | Attending: Unknown Physician Specialty

## 2017-08-24 ENCOUNTER — Ambulatory Visit: Payer: PPO | Admitting: Anesthesiology

## 2017-08-24 ENCOUNTER — Encounter: Payer: Self-pay | Admitting: Unknown Physician Specialty

## 2017-08-24 ENCOUNTER — Ambulatory Visit
Admission: RE | Admit: 2017-08-24 | Discharge: 2017-08-24 | Disposition: A | Discharge status: Home / Self Care | Payer: PPO | Source: Ambulatory Visit | Attending: Unknown Physician Specialty | Admitting: Unknown Physician Specialty

## 2017-08-24 DIAGNOSIS — Z1211 Encounter for screening for malignant neoplasm of colon: Secondary | ICD-10-CM | POA: Diagnosis not present

## 2017-08-24 DIAGNOSIS — E119 Type 2 diabetes mellitus without complications: Secondary | ICD-10-CM | POA: Diagnosis not present

## 2017-08-24 DIAGNOSIS — D123 Benign neoplasm of transverse colon: Secondary | ICD-10-CM | POA: Insufficient documentation

## 2017-08-24 DIAGNOSIS — I252 Old myocardial infarction: Secondary | ICD-10-CM | POA: Diagnosis not present

## 2017-08-24 DIAGNOSIS — Z79899 Other long term (current) drug therapy: Secondary | ICD-10-CM | POA: Insufficient documentation

## 2017-08-24 DIAGNOSIS — Z7982 Long term (current) use of aspirin: Secondary | ICD-10-CM | POA: Insufficient documentation

## 2017-08-24 DIAGNOSIS — Z86010 Personal history of colonic polyps: Secondary | ICD-10-CM | POA: Insufficient documentation

## 2017-08-24 DIAGNOSIS — K640 First degree hemorrhoids: Secondary | ICD-10-CM | POA: Diagnosis not present

## 2017-08-24 DIAGNOSIS — K648 Other hemorrhoids: Secondary | ICD-10-CM | POA: Diagnosis not present

## 2017-08-24 DIAGNOSIS — K219 Gastro-esophageal reflux disease without esophagitis: Secondary | ICD-10-CM | POA: Diagnosis not present

## 2017-08-24 DIAGNOSIS — D125 Benign neoplasm of sigmoid colon: Secondary | ICD-10-CM | POA: Diagnosis not present

## 2017-08-24 DIAGNOSIS — R51 Headache: Secondary | ICD-10-CM | POA: Diagnosis not present

## 2017-08-24 DIAGNOSIS — E785 Hyperlipidemia, unspecified: Secondary | ICD-10-CM | POA: Insufficient documentation

## 2017-08-24 DIAGNOSIS — Z87891 Personal history of nicotine dependence: Secondary | ICD-10-CM | POA: Insufficient documentation

## 2017-08-24 DIAGNOSIS — I10 Essential (primary) hypertension: Secondary | ICD-10-CM | POA: Diagnosis not present

## 2017-08-24 DIAGNOSIS — I2510 Atherosclerotic heart disease of native coronary artery without angina pectoris: Secondary | ICD-10-CM | POA: Diagnosis not present

## 2017-08-24 DIAGNOSIS — K635 Polyp of colon: Secondary | ICD-10-CM | POA: Diagnosis not present

## 2017-08-24 HISTORY — DX: Hyperlipidemia, unspecified: E78.5

## 2017-08-24 HISTORY — DX: Personal history of colonic polyps: Z86.010

## 2017-08-24 HISTORY — DX: Hyperglycemia, unspecified: R73.9

## 2017-08-24 HISTORY — DX: Atherosclerotic heart disease of native coronary artery without angina pectoris: I25.10

## 2017-08-24 HISTORY — PX: COLONOSCOPY WITH PROPOFOL: SHX5780

## 2017-08-24 HISTORY — DX: Heart disease, unspecified: I51.9

## 2017-08-24 HISTORY — DX: Personal history of colon polyps, unspecified: Z86.0100

## 2017-08-24 LAB — GLUCOSE, CAPILLARY: GLUCOSE-CAPILLARY: 174 mg/dL — AB (ref 70–99)

## 2017-08-24 SURGERY — COLONOSCOPY WITH PROPOFOL
Anesthesia: General

## 2017-08-24 MED ORDER — SODIUM CHLORIDE 0.9 % IV SOLN: INTRAVENOUS | Status: DC

## 2017-08-24 MED ORDER — EPHEDRINE SULFATE 50 MG/ML IJ SOLN
INTRAMUSCULAR | Status: AC
  Filled 2017-08-24: qty 1

## 2017-08-24 MED ORDER — PHENYLEPHRINE HCL 10 MG/ML IJ SOLN
INTRAMUSCULAR | Status: AC
  Filled 2017-08-24: qty 1

## 2017-08-24 MED ORDER — LIDOCAINE 2% (20 MG/ML) 5 ML SYRINGE
INTRAMUSCULAR | Status: DC | PRN
  Administered 2017-08-24: 30 mg via INTRAVENOUS

## 2017-08-24 MED ORDER — PROPOFOL 500 MG/50ML IV EMUL
INTRAVENOUS | Status: DC | PRN
  Administered 2017-08-24: 160 ug/kg/min via INTRAVENOUS

## 2017-08-24 MED ORDER — LIDOCAINE HCL (PF) 1 % IJ SOLN
INTRAMUSCULAR | Status: AC
  Administered 2017-08-24: 0.3 mL
  Filled 2017-08-24: qty 2

## 2017-08-24 MED ORDER — PROPOFOL 10 MG/ML IV BOLUS
INTRAVENOUS | Status: AC
Start: 2017-08-24 — End: ?
  Filled 2017-08-24: qty 20

## 2017-08-24 MED ORDER — PROPOFOL 10 MG/ML IV BOLUS
INTRAVENOUS | Status: DC | PRN
  Administered 2017-08-24: 100 mg via INTRAVENOUS

## 2017-08-24 MED ORDER — FENTANYL CITRATE (PF) 100 MCG/2ML IJ SOLN
INTRAMUSCULAR | Status: DC | PRN
  Administered 2017-08-24 (×2): 50 ug via INTRAVENOUS

## 2017-08-24 MED ORDER — FENTANYL CITRATE (PF) 100 MCG/2ML IJ SOLN
INTRAMUSCULAR | Status: AC
  Filled 2017-08-24: qty 2

## 2017-08-24 MED ORDER — PROPOFOL 500 MG/50ML IV EMUL
INTRAVENOUS | Status: AC
  Filled 2017-08-24: qty 50

## 2017-08-24 MED ORDER — SODIUM CHLORIDE 0.9 % IV SOLN
INTRAVENOUS | Status: DC | PRN
  Administered 2017-08-24: 07:00:00 via INTRAVENOUS

## 2017-08-24 MED ORDER — LIDOCAINE HCL (PF) 2 % IJ SOLN
INTRAMUSCULAR | Status: AC
  Filled 2017-08-24: qty 10

## 2017-08-24 NOTE — Anesthesia Postprocedure Evaluation (Signed)
Anesthesia Post Note  Patient: Joshua Ferrell  Procedure(s) Performed: COLONOSCOPY WITH PROPOFOL (N/A )  Patient location during evaluation: PACU Anesthesia Type: General Level of consciousness: awake and alert Pain management: pain level controlled Vital Signs Assessment: post-procedure vital signs reviewed and stable Respiratory status: spontaneous breathing, nonlabored ventilation, respiratory function stable and patient connected to nasal cannula oxygen Cardiovascular status: blood pressure returned to baseline and stable Postop Assessment: no apparent nausea or vomiting Anesthetic complications: no     Last Vitals:  Vitals:   08/24/17 0820 08/24/17 0830  BP: (!) 142/82 (!) 142/82  Pulse:  73  Resp:  18  Temp:    SpO2:  97%    Last Pain:  Vitals:   08/24/17 0800  TempSrc: Tympanic  PainSc:                  Molli Barrows

## 2017-08-24 NOTE — Anesthesia Post-op Follow-up Note (Signed)
Anesthesia QCDR form completed.        

## 2017-08-24 NOTE — Op Note (Signed)
[facility] Gastroenterology Patient Name: Joshua Ferrell Procedure Date: 08/24/2017 7:21 AM MRN: [id number] Account #: [id number] Date of Birth: [birth year] Admit Type: Outpatient Age: 72 Room: [facility] ENDO ROOM 3 Gender: Male Note Status: Finalized Procedure:            Colonoscopy Indications:          High risk colon cancer surveillance: Personal history                        of colonic polyps Providers:            Manya Silvas, MD Referring MD:         Ocie Cornfield. Ouida Sills MD, MD (Referring MD) Medicines:            Propofol per Anesthesia Complications:        No immediate complications. Procedure:            Pre-Anesthesia Assessment:                       - After reviewing the risks and benefits, the patient                        was deemed in satisfactory condition to undergo the                        procedure.                       After obtaining informed consent, the colonoscope was                        passed under direct vision. Throughout the procedure,                        the patient's blood pressure, pulse, and oxygen                        saturations were monitored continuously. The                        Colonoscope was introduced through the anus and                        advanced to the the cecum, identified by appendiceal                        orifice and ileocecal valve. The colonoscopy was                        performed without difficulty. The patient tolerated the                        procedure well. The quality of the bowel preparation                        was excellent. Findings:      A small polyp was found in the sigmoid colon. The polyp was sessile. The       polyp was removed with a hot snare. Resection and retrieval were       complete.      A diminutive polyp was  found in the distal transverse colon. The polyp       was sessile. The polyp was removed with a jumbo cold forceps. Resection       and retrieval were  complete.      Internal hemorrhoids were found during endoscopy. The hemorrhoids were       small and Grade I (internal hemorrhoids that do not prolapse).      The exam was otherwise without abnormality. Impression:           - One small polyp in the sigmoid colon, removed with a                        hot snare. Resected and retrieved.                       - One diminutive polyp in the distal transverse colon,                        removed with a jumbo cold forceps. Resected and                        retrieved.                       - Internal hemorrhoids.                       - The examination was otherwise normal. Recommendation:       - Await pathology results. Manya Silvas, MD 08/24/2017 8:02:51 AM This report has been signed electronically. Number of Addenda: 0 Note Initiated On: 08/24/2017 7:21 AM Scope Withdrawal Time: 0 hours 9 minutes 55 seconds  Total Procedure Duration: 0 hours 17 minutes 47 seconds       [facility]

## 2017-08-24 NOTE — Anesthesia Preprocedure Evaluation (Signed)
Anesthesia Evaluation  Patient identified by MRN, date of birth, ID band Patient awake    Reviewed: Allergy & Precautions, H&P , NPO status , reviewed documented beta blocker date and time   History of Anesthesia Complications (+) PONV and history of anesthetic complications  Airway Mallampati: II  TM Distance: >3 FB     Dental  (+) Chipped, Missing, Poor Dentition   Pulmonary former smoker,    Pulmonary exam normal        Cardiovascular hypertension, + CAD and + Past MI  Normal cardiovascular exam     Neuro/Psych  Headaches,    GI/Hepatic GERD  Medicated and Controlled,  Endo/Other  diabetes  Renal/GU      Musculoskeletal  (+) Arthritis ,   Abdominal   Peds  Hematology   Anesthesia Other Findings Past Medical History: No date: Arthritis No date: Coronary artery disease No date: Diabetes mellitus without complication (HCC) No date: GERD (gastroesophageal reflux disease) No date: Headache(784.0) No date: Heart disease No date: History of blood transfusion No date: History of colonic polyps No date: Hyperglycemia No date: Hyperlipidemia No date: Hypertension 2003: Myocardial infarction ([place]) No date: PONV (postoperative nausea and vomiting)  Past Surgical History: No date: ABDOMINAL SURGERY No date: BACK SURGERY 08/13/2015: CATARACT EXTRACTION W/PHACO; Right     Comment:  Procedure: CATARACT EXTRACTION PHACO AND INTRAOCULAR               LENS PLACEMENT (IOC);  Surgeon: Estill Cotta, MD;                Location: [facility] ORS;  Service: Ophthalmology;  Laterality:              Right;  [place] 01:08 AP% 24.9 CDE 32.40 fluid pack lot #               [id number] No date: COLONOSCOPY No date: CORONARY ANGIOPLASTY     Comment:  one stent placed 10/23/2012: LUMBAR LAMINECTOMY/DECOMPRESSION MICRODISCECTOMY; Bilateral     Comment:  Procedure: LUMBAR LAMINECTOMY/DECOMPRESSION               MICRODISCECTOMY 1 LEVEL;   Surgeon: Otilio Connors, MD;                Location: [facility] NEURO ORS;  Service: Neurosurgery;                Laterality: Bilateral;  LUMBAR LAMINECTOMY/DECOMPRESSION               MICRODISCECTOMY 1 LEVEL No date: PENILE PROSTHESIS IMPLANT     Comment:  x3 No date: right shoulder arthroscopic No date: SHOULDER SURGERY No date: SUPRAPUBIC CATHETER PLACEMENT     Comment:  and removal No date: URETHERAL RE-IMPLANTATION  Physically active w/o symptoms     Reproductive/Obstetrics                             Anesthesia Physical Anesthesia Plan  ASA: III  Anesthesia Plan: General   Post-op Pain Management:    Induction: Intravenous  PONV Risk Score and Plan: 2 and Treatment may vary due to age or medical condition and TIVA  Airway Management Planned: Nasal Cannula and Natural Airway  Additional Equipment:   Intra-op Plan:   Post-operative Plan:   Informed Consent: I have reviewed the patients History and Physical, chart, labs and discussed the procedure including the risks, benefits and alternatives for the proposed anesthesia with the patient or authorized  representative who has indicated his/her understanding and acceptance.   Dental Advisory Given  Plan Discussed with: CRNA  Anesthesia Plan Comments:         Anesthesia Quick Evaluation

## 2017-08-24 NOTE — H&P (Signed)
Primary Care Physician:  Kirk Ruths, MD Primary Gastroenterologist:  Dr. Vira Agar  Pre-Procedure History & Physical: HPI:  Joshua Ferrell is a 72 y.o. male is here for an colonoscopy.  PH colon polyps is indication.   Past Medical History:  Diagnosis Date  . Arthritis   . Coronary artery disease   . Diabetes mellitus without complication ([place])   . GERD (gastroesophageal reflux disease)   . Headache(784.0)   . Heart disease   . History of blood transfusion   . History of colonic polyps   . Hyperglycemia   . Hyperlipidemia   . Hypertension   . Myocardial infarction ([place]) 2003  . PONV (postoperative nausea and vomiting)     Past Surgical History:  Procedure Laterality Date  . ABDOMINAL SURGERY    . BACK SURGERY    . CATARACT EXTRACTION W/PHACO Right 08/13/2015   Procedure: CATARACT EXTRACTION PHACO AND INTRAOCULAR LENS PLACEMENT (IOC);  Surgeon: Estill Cotta, MD;  Location: [facility] ORS;  Service: Ophthalmology;  Laterality: Right;  [place] 01:08 AP% 24.9 CDE 32.40 fluid pack lot # [id number]  . COLONOSCOPY    . CORONARY ANGIOPLASTY     one stent placed  . LUMBAR LAMINECTOMY/DECOMPRESSION MICRODISCECTOMY Bilateral 10/23/2012   Procedure: LUMBAR LAMINECTOMY/DECOMPRESSION MICRODISCECTOMY 1 LEVEL;  Surgeon: Otilio Connors, MD;  Location: [facility] NEURO ORS;  Service: Neurosurgery;  Laterality: Bilateral;  LUMBAR LAMINECTOMY/DECOMPRESSION MICRODISCECTOMY 1 LEVEL  . PENILE PROSTHESIS IMPLANT     x3  . right shoulder arthroscopic    . SHOULDER SURGERY    . SUPRAPUBIC CATHETER PLACEMENT     and removal  . URETHERAL RE-IMPLANTATION      Prior to Admission medications   Medication Sig Start Date End Date Taking? Authorizing Provider  glimepiride (AMARYL) 2 MG tablet Take 2 mg by mouth daily with breakfast.   Yes [provider]  losartan (COZAAR) 100 MG tablet Take 100 mg by mouth daily.   Yes [provider]  nitroGLYCERIN (NITROSTAT) 0.4 MG SL tablet  Place 0.4 mg under the tongue every 5 (five) minutes as needed for chest pain.   Yes [provider]  aspirin EC 81 MG tablet Take 81 mg by mouth daily.    [provider]  DUREZOL 0.05 % EMUL  07/17/15   [provider]  metFORMIN (GLUCOPHAGE) 500 MG tablet Take 1,000 mg by mouth 2 (two) times daily with a meal.    [provider]  metoprolol succinate (TOPROL-XL) 100 MG 24 hr tablet Take 100 mg by mouth daily. Take with or immediately following a meal.    [provider]  rosuvastatin (CRESTOR) 5 MG tablet Take 5 mg by mouth daily.    [provider]    Allergies as of 03/31/2017 - Review Complete 08/13/2015  Allergen Reaction Noted  . Celebrex [celecoxib] Itching 10/20/2012    Family History  Problem Relation Age of Onset  . CAD Mother   . Heart attack Father     Social History   Socioeconomic History  . Marital status: Widowed    Spouse name: Not on file  . Number of children: Not on file  . Years of education: Not on file  . Highest education level: Not on file  Occupational History  . Not on file  Social Needs  . Financial resource strain: Not on file  . Food insecurity:    Worry: Not on file    Inability: Not on file  . Transportation needs:  Medical: Not on file    Non-medical: Not on file  Tobacco Use  . Smoking status: Former Smoker    Packs/day: 1.00    Years: 5.00    Pack years: 5.00    Types: Cigarettes  . Smokeless tobacco: Current User    Types: Chew  Substance and Sexual Activity  . Alcohol use: Yes    Alcohol/week: 3.0 oz    Types: 5 Cans of beer per week    Comment: not every day but few times per week  . Drug use: No  . Sexual activity: Not on file    Comment: Married   Lifestyle  . Physical activity:    Days per week: Not on file    Minutes per session: Not on file  . Stress: Not on file  Relationships  . Social connections:    Talks on phone: Not on file    Gets together: Not on  file    Attends religious service: Not on file    Active member of club or organization: Not on file    Attends meetings of clubs or organizations: Not on file    Relationship status: Not on file  . Intimate partner violence:    Fear of current or ex partner: Not on file    Emotionally abused: Not on file    Physically abused: Not on file    Forced sexual activity: Not on file  Other Topics Concern  . Not on file  Social History Narrative  . Not on file    Review of Systems: See HPI, otherwise negative ROS  Physical Exam: BP (!) 152/107   Pulse 66   Temp (!) 96.1 F (35.6 C) (Tympanic)   Resp 17   Ht 5\' 8"  (1.727 m)   Wt 100.2 kg (221 lb)   SpO2 99%   BMI 33.60 kg/m  General:   Alert,  pleasant and cooperative in NAD Head:  Normocephalic and atraumatic. Neck:  Supple; no masses or thyromegaly. Lungs:  Clear throughout to auscultation.    Heart:  Regular rate and rhythm. Abdomen:  Soft, nontender and nondistended. Normal bowel sounds, without guarding, and without rebound.   Neurologic:  Alert and  oriented x4;  grossly normal neurologically.  Impression/Plan: Joshua Ferrell is here for an colonoscopy to be performed for [facility] colon polyps.  Risks, benefits, limitations, and alternatives regarding  colonoscopy have been reviewed with the patient.  Questions have been answered.  All parties agreeable.   Gaylyn Cheers, MD  08/24/2017, 7:33 AM

## 2017-08-24 NOTE — Transfer of Care (Signed)
Immediate Anesthesia Transfer of Care Note  Patient: Joshua Ferrell  Procedure(s) Performed: COLONOSCOPY WITH PROPOFOL (N/A )  Patient Location: PACU and Endoscopy Unit  Anesthesia Type:General  Level of Consciousness: sedated  Airway & Oxygen Therapy: Patient Spontanous Breathing and Patient connected to nasal cannula oxygen  Post-op Assessment: Report given to RN and Post -op Vital signs reviewed and stable  Post vital signs: Reviewed and stable  Last Vitals:  Vitals Value Taken Time  BP 119/55 08/24/2017  8:00 AM  Temp 36.2 C 08/24/2017  8:00 AM  Pulse 73 08/24/2017  8:04 AM  Resp 12 08/24/2017  8:04 AM  SpO2 96 % 08/24/2017  8:04 AM  Vitals shown include unvalidated device data.  Last Pain:  Vitals:   08/24/17 0800  TempSrc: Tympanic  PainSc:          Complications: No apparent anesthesia complications

## 2017-08-25 LAB — SURGICAL PATHOLOGY

## 2017-10-05 DIAGNOSIS — E1121 Type 2 diabetes mellitus with diabetic nephropathy: Secondary | ICD-10-CM | POA: Diagnosis not present

## 2017-10-05 DIAGNOSIS — Z789 Other specified health status: Secondary | ICD-10-CM | POA: Diagnosis not present

## 2017-10-05 DIAGNOSIS — I252 Old myocardial infarction: Secondary | ICD-10-CM | POA: Diagnosis not present

## 2017-10-05 DIAGNOSIS — I10 Essential (primary) hypertension: Secondary | ICD-10-CM | POA: Diagnosis not present

## 2017-10-05 DIAGNOSIS — E785 Hyperlipidemia, unspecified: Secondary | ICD-10-CM | POA: Diagnosis not present

## 2017-10-05 DIAGNOSIS — I2510 Atherosclerotic heart disease of native coronary artery without angina pectoris: Secondary | ICD-10-CM | POA: Diagnosis not present

## 2017-11-23 DIAGNOSIS — Z0000 Encounter for general adult medical examination without abnormal findings: Secondary | ICD-10-CM | POA: Diagnosis not present

## 2017-11-23 DIAGNOSIS — E785 Hyperlipidemia, unspecified: Secondary | ICD-10-CM | POA: Diagnosis not present

## 2017-11-23 DIAGNOSIS — I10 Essential (primary) hypertension: Secondary | ICD-10-CM | POA: Diagnosis not present

## 2017-11-23 DIAGNOSIS — E1121 Type 2 diabetes mellitus with diabetic nephropathy: Secondary | ICD-10-CM | POA: Diagnosis not present

## 2017-11-23 DIAGNOSIS — Z23 Encounter for immunization: Secondary | ICD-10-CM | POA: Diagnosis not present

## 2017-11-23 DIAGNOSIS — I2510 Atherosclerotic heart disease of native coronary artery without angina pectoris: Secondary | ICD-10-CM | POA: Diagnosis not present

## 2018-02-24 DIAGNOSIS — H93A1 Pulsatile tinnitus, right ear: Secondary | ICD-10-CM | POA: Diagnosis not present

## 2018-02-24 DIAGNOSIS — M4807 Spinal stenosis, lumbosacral region: Secondary | ICD-10-CM | POA: Diagnosis not present

## 2018-02-24 DIAGNOSIS — M48061 Spinal stenosis, lumbar region without neurogenic claudication: Secondary | ICD-10-CM | POA: Diagnosis not present

## 2018-03-09 DIAGNOSIS — I6523 Occlusion and stenosis of bilateral carotid arteries: Secondary | ICD-10-CM | POA: Diagnosis not present

## 2018-03-09 DIAGNOSIS — H93A1 Pulsatile tinnitus, right ear: Secondary | ICD-10-CM | POA: Diagnosis not present

## 2018-04-06 DIAGNOSIS — E785 Hyperlipidemia, unspecified: Secondary | ICD-10-CM | POA: Diagnosis not present

## 2018-04-06 DIAGNOSIS — I2510 Atherosclerotic heart disease of native coronary artery without angina pectoris: Secondary | ICD-10-CM | POA: Diagnosis not present

## 2018-04-06 DIAGNOSIS — I10 Essential (primary) hypertension: Secondary | ICD-10-CM | POA: Diagnosis not present

## 2018-04-06 DIAGNOSIS — E1121 Type 2 diabetes mellitus with diabetic nephropathy: Secondary | ICD-10-CM | POA: Diagnosis not present

## 2018-04-06 DIAGNOSIS — I252 Old myocardial infarction: Secondary | ICD-10-CM | POA: Diagnosis not present

## 2018-05-16 DIAGNOSIS — E785 Hyperlipidemia, unspecified: Secondary | ICD-10-CM | POA: Diagnosis not present

## 2018-05-16 DIAGNOSIS — I10 Essential (primary) hypertension: Secondary | ICD-10-CM | POA: Diagnosis not present

## 2018-05-16 DIAGNOSIS — I2510 Atherosclerotic heart disease of native coronary artery without angina pectoris: Secondary | ICD-10-CM | POA: Diagnosis not present

## 2018-05-16 DIAGNOSIS — Z125 Encounter for screening for malignant neoplasm of prostate: Secondary | ICD-10-CM | POA: Diagnosis not present

## 2018-05-16 DIAGNOSIS — E1121 Type 2 diabetes mellitus with diabetic nephropathy: Secondary | ICD-10-CM | POA: Diagnosis not present

## 2018-05-24 DIAGNOSIS — I2510 Atherosclerotic heart disease of native coronary artery without angina pectoris: Secondary | ICD-10-CM | POA: Diagnosis not present

## 2018-05-24 DIAGNOSIS — M4800 Spinal stenosis, site unspecified: Secondary | ICD-10-CM | POA: Diagnosis not present

## 2018-05-24 DIAGNOSIS — E1121 Type 2 diabetes mellitus with diabetic nephropathy: Secondary | ICD-10-CM | POA: Diagnosis not present

## 2018-05-24 DIAGNOSIS — I10 Essential (primary) hypertension: Secondary | ICD-10-CM | POA: Diagnosis not present

## 2018-05-24 DIAGNOSIS — E785 Hyperlipidemia, unspecified: Secondary | ICD-10-CM | POA: Diagnosis not present

## 2018-05-30 ENCOUNTER — Other Ambulatory Visit: Payer: Self-pay | Admitting: Physical Medicine and Rehabilitation

## 2018-05-30 DIAGNOSIS — M5416 Radiculopathy, lumbar region: Secondary | ICD-10-CM | POA: Diagnosis not present

## 2018-05-30 DIAGNOSIS — M4807 Spinal stenosis, lumbosacral region: Secondary | ICD-10-CM | POA: Diagnosis not present

## 2018-05-30 DIAGNOSIS — M5136 Other intervertebral disc degeneration, lumbar region: Secondary | ICD-10-CM | POA: Diagnosis not present

## 2018-05-30 DIAGNOSIS — G8929 Other chronic pain: Secondary | ICD-10-CM | POA: Diagnosis not present

## 2018-05-30 DIAGNOSIS — M5441 Lumbago with sciatica, right side: Secondary | ICD-10-CM | POA: Diagnosis not present

## 2018-05-30 DIAGNOSIS — M5442 Lumbago with sciatica, left side: Secondary | ICD-10-CM | POA: Diagnosis not present

## 2018-06-07 ENCOUNTER — Other Ambulatory Visit: Payer: Self-pay

## 2018-06-07 ENCOUNTER — Ambulatory Visit
Admission: RE | Admit: 2018-06-07 | Discharge: 2018-06-07 | Disposition: A | Discharge status: Home / Self Care | Payer: PPO | Source: Ambulatory Visit | Attending: Physical Medicine and Rehabilitation | Admitting: Physical Medicine and Rehabilitation

## 2018-06-07 DIAGNOSIS — M5416 Radiculopathy, lumbar region: Secondary | ICD-10-CM | POA: Insufficient documentation

## 2018-06-07 DIAGNOSIS — M545 Low back pain: Secondary | ICD-10-CM | POA: Diagnosis not present

## 2018-06-14 DIAGNOSIS — M5136 Other intervertebral disc degeneration, lumbar region: Secondary | ICD-10-CM | POA: Diagnosis not present

## 2018-06-14 DIAGNOSIS — M4807 Spinal stenosis, lumbosacral region: Secondary | ICD-10-CM | POA: Diagnosis not present

## 2018-06-14 DIAGNOSIS — M5416 Radiculopathy, lumbar region: Secondary | ICD-10-CM | POA: Diagnosis not present

## 2018-06-19 DIAGNOSIS — M7989 Other specified soft tissue disorders: Secondary | ICD-10-CM | POA: Diagnosis not present

## 2018-06-19 DIAGNOSIS — E1121 Type 2 diabetes mellitus with diabetic nephropathy: Secondary | ICD-10-CM | POA: Diagnosis not present

## 2018-06-27 DIAGNOSIS — M5416 Radiculopathy, lumbar region: Secondary | ICD-10-CM | POA: Diagnosis not present

## 2018-06-27 DIAGNOSIS — M5136 Other intervertebral disc degeneration, lumbar region: Secondary | ICD-10-CM | POA: Diagnosis not present

## 2018-06-27 DIAGNOSIS — M48062 Spinal stenosis, lumbar region with neurogenic claudication: Secondary | ICD-10-CM | POA: Diagnosis not present

## 2018-07-25 DIAGNOSIS — M48062 Spinal stenosis, lumbar region with neurogenic claudication: Secondary | ICD-10-CM | POA: Diagnosis not present

## 2018-07-25 DIAGNOSIS — M5136 Other intervertebral disc degeneration, lumbar region: Secondary | ICD-10-CM | POA: Diagnosis not present

## 2018-07-25 DIAGNOSIS — M5416 Radiculopathy, lumbar region: Secondary | ICD-10-CM | POA: Diagnosis not present

## 2018-08-15 DIAGNOSIS — M5416 Radiculopathy, lumbar region: Secondary | ICD-10-CM | POA: Diagnosis not present

## 2018-08-15 DIAGNOSIS — M5136 Other intervertebral disc degeneration, lumbar region: Secondary | ICD-10-CM | POA: Diagnosis not present

## 2018-08-15 DIAGNOSIS — M48062 Spinal stenosis, lumbar region with neurogenic claudication: Secondary | ICD-10-CM | POA: Diagnosis not present

## 2018-08-28 DIAGNOSIS — M5442 Lumbago with sciatica, left side: Secondary | ICD-10-CM | POA: Diagnosis not present

## 2018-08-28 DIAGNOSIS — G8929 Other chronic pain: Secondary | ICD-10-CM | POA: Diagnosis not present

## 2018-08-28 DIAGNOSIS — M5441 Lumbago with sciatica, right side: Secondary | ICD-10-CM | POA: Diagnosis not present

## 2018-09-01 DIAGNOSIS — M5441 Lumbago with sciatica, right side: Secondary | ICD-10-CM | POA: Diagnosis not present

## 2018-09-01 DIAGNOSIS — M5442 Lumbago with sciatica, left side: Secondary | ICD-10-CM | POA: Diagnosis not present

## 2018-09-01 DIAGNOSIS — G8929 Other chronic pain: Secondary | ICD-10-CM | POA: Diagnosis not present

## 2018-09-05 DIAGNOSIS — G8929 Other chronic pain: Secondary | ICD-10-CM | POA: Diagnosis not present

## 2018-09-05 DIAGNOSIS — M5441 Lumbago with sciatica, right side: Secondary | ICD-10-CM | POA: Diagnosis not present

## 2018-09-05 DIAGNOSIS — M5442 Lumbago with sciatica, left side: Secondary | ICD-10-CM | POA: Diagnosis not present

## 2018-09-14 DIAGNOSIS — M5441 Lumbago with sciatica, right side: Secondary | ICD-10-CM | POA: Diagnosis not present

## 2018-09-14 DIAGNOSIS — G8929 Other chronic pain: Secondary | ICD-10-CM | POA: Diagnosis not present

## 2018-09-14 DIAGNOSIS — M5442 Lumbago with sciatica, left side: Secondary | ICD-10-CM | POA: Diagnosis not present

## 2018-09-18 DIAGNOSIS — M6281 Muscle weakness (generalized): Secondary | ICD-10-CM | POA: Diagnosis not present

## 2018-09-21 DIAGNOSIS — M6281 Muscle weakness (generalized): Secondary | ICD-10-CM | POA: Diagnosis not present

## 2018-09-26 DIAGNOSIS — M5416 Radiculopathy, lumbar region: Secondary | ICD-10-CM | POA: Diagnosis not present

## 2018-09-26 DIAGNOSIS — M48062 Spinal stenosis, lumbar region with neurogenic claudication: Secondary | ICD-10-CM | POA: Diagnosis not present

## 2018-09-26 DIAGNOSIS — M5136 Other intervertebral disc degeneration, lumbar region: Secondary | ICD-10-CM | POA: Diagnosis not present

## 2018-10-03 DIAGNOSIS — M48062 Spinal stenosis, lumbar region with neurogenic claudication: Secondary | ICD-10-CM | POA: Diagnosis not present

## 2018-10-04 ENCOUNTER — Other Ambulatory Visit: Payer: Self-pay | Admitting: Neurosurgery

## 2018-10-04 DIAGNOSIS — E1121 Type 2 diabetes mellitus with diabetic nephropathy: Secondary | ICD-10-CM | POA: Diagnosis not present

## 2018-10-04 DIAGNOSIS — E785 Hyperlipidemia, unspecified: Secondary | ICD-10-CM | POA: Diagnosis not present

## 2018-10-04 DIAGNOSIS — I2510 Atherosclerotic heart disease of native coronary artery without angina pectoris: Secondary | ICD-10-CM | POA: Diagnosis not present

## 2018-10-04 DIAGNOSIS — I10 Essential (primary) hypertension: Secondary | ICD-10-CM | POA: Diagnosis not present

## 2018-10-04 DIAGNOSIS — Z01810 Encounter for preprocedural cardiovascular examination: Secondary | ICD-10-CM | POA: Diagnosis not present

## 2018-10-04 DIAGNOSIS — Z789 Other specified health status: Secondary | ICD-10-CM | POA: Diagnosis not present

## 2018-10-05 ENCOUNTER — Other Ambulatory Visit: Payer: PPO

## 2018-10-10 DIAGNOSIS — I10 Essential (primary) hypertension: Secondary | ICD-10-CM | POA: Diagnosis not present

## 2018-10-10 DIAGNOSIS — I2510 Atherosclerotic heart disease of native coronary artery without angina pectoris: Secondary | ICD-10-CM | POA: Diagnosis not present

## 2018-10-10 DIAGNOSIS — M48062 Spinal stenosis, lumbar region with neurogenic claudication: Secondary | ICD-10-CM | POA: Diagnosis not present

## 2018-10-10 DIAGNOSIS — M47816 Spondylosis without myelopathy or radiculopathy, lumbar region: Secondary | ICD-10-CM | POA: Diagnosis not present

## 2018-10-10 DIAGNOSIS — E1121 Type 2 diabetes mellitus with diabetic nephropathy: Secondary | ICD-10-CM | POA: Diagnosis not present

## 2018-10-10 DIAGNOSIS — M5136 Other intervertebral disc degeneration, lumbar region: Secondary | ICD-10-CM | POA: Diagnosis not present

## 2018-10-17 ENCOUNTER — Other Ambulatory Visit: Payer: Self-pay

## 2018-10-17 ENCOUNTER — Encounter
Admission: RE | Admit: 2018-10-17 | Discharge: 2018-10-17 | Disposition: A | Discharge status: Home / Self Care | Payer: PPO | Source: Ambulatory Visit | Attending: Neurosurgery | Admitting: Neurosurgery

## 2018-10-17 DIAGNOSIS — Z01812 Encounter for preprocedural laboratory examination: Secondary | ICD-10-CM | POA: Insufficient documentation

## 2018-10-17 DIAGNOSIS — R9439 Abnormal result of other cardiovascular function study: Secondary | ICD-10-CM | POA: Diagnosis not present

## 2018-10-17 DIAGNOSIS — E785 Hyperlipidemia, unspecified: Secondary | ICD-10-CM | POA: Diagnosis not present

## 2018-10-17 DIAGNOSIS — I2510 Atherosclerotic heart disease of native coronary artery without angina pectoris: Secondary | ICD-10-CM | POA: Diagnosis not present

## 2018-10-17 DIAGNOSIS — I10 Essential (primary) hypertension: Secondary | ICD-10-CM | POA: Diagnosis not present

## 2018-10-17 DIAGNOSIS — I252 Old myocardial infarction: Secondary | ICD-10-CM | POA: Diagnosis not present

## 2018-10-17 HISTORY — DX: Dyspnea, unspecified: R06.00

## 2018-10-17 LAB — URINALYSIS, ROUTINE W REFLEX MICROSCOPIC
Bacteria, UA: NONE SEEN
Bilirubin Urine: NEGATIVE
Glucose, UA: >=500 mg/dL — AB
Hgb urine dipstick: NEGATIVE
Ketones, ur: NEGATIVE mg/dL
Leukocytes,Ua: NEGATIVE
Nitrite: NEGATIVE
Protein, ur: NEGATIVE mg/dL
Specific Gravity, Urine: 1.006 (ref 1.005–1.030)
Squamous Epithelial / HPF: NONE SEEN (ref 0–5)
WBC, UA: NONE SEEN WBC/hpf (ref 0–5)
pH: 5 (ref 5.0–8.0)

## 2018-10-17 LAB — CBC
HCT: 41 % (ref 39.0–52.0)
Hemoglobin: 14.1 g/dL (ref 13.0–17.0)
MCH: 31.9 pg (ref 26.0–34.0)
MCHC: 34.4 g/dL (ref 30.0–36.0)
MCV: 92.8 fL (ref 80.0–100.0)
Platelets: 151 10*3/uL (ref 150–400)
RBC: 4.42 MIL/uL (ref 4.22–5.81)
RDW: 12.5 % (ref 11.5–15.5)
WBC: 5.4 10*3/uL (ref 4.0–10.5)
nRBC: 0 % (ref 0.0–0.2)

## 2018-10-17 LAB — PROTIME-INR
INR: 1 (ref 0.8–1.2)
Prothrombin Time: 13.5 seconds (ref 11.4–15.2)

## 2018-10-17 LAB — BASIC METABOLIC PANEL
Anion gap: 9 (ref 5–15)
BUN: 17 mg/dL (ref 8–23)
CO2: 24 mmol/L (ref 22–32)
Calcium: 9.3 mg/dL (ref 8.9–10.3)
Chloride: 103 mmol/L (ref 98–111)
Creatinine, Ser: 0.96 mg/dL (ref 0.61–1.24)
GFR calc Af Amer: >60 mL/min (ref >60)
GFR calc non Af Amer: >60 mL/min (ref >60)
Glucose, Bld: 222 mg/dL — ABNORMAL HIGH (ref 70–99)
Potassium: 4.1 mmol/L (ref 3.5–5.1)
Sodium: 136 mmol/L (ref 135–145)

## 2018-10-17 LAB — APTT: aPTT: 29 seconds (ref 24–36)

## 2018-10-17 LAB — SURGICAL PCR SCREEN
MRSA, PCR: NEGATIVE
Staphylococcus aureus: NEGATIVE

## 2018-10-17 LAB — TYPE AND SCREEN
ABO/RH(D): A POS
Antibody Screen: NEGATIVE

## 2018-10-17 NOTE — Pre-Procedure Instructions (Signed)
Other Result ReportsCollapse All 2020 September NM myocardial perfusion SPECT multiple (stress and rest)10/10/2018 [place] Result Impression   1. Moderately reduced left ventricular function 2. Moderate global hypokinesis 3. Mild lateral wall ischemia  Result Narrative  CARDIOLOGY DEPARTMENT [facility] [address], [place], [place] [address] [phone]  Procedure: Pharmacologic Myocardial Perfusion Imaging  ONE day procedure  Indication: CAD Ordering Physician:   Dr. Isaias Cowman   Clinical History: 73 y.o. year old male Vitals: Height: 58 in Weight: 226 lb Cardiac risk factors include:   Hyperlipidemia, Previous MI, Diabetes, PCI, HTN, Family Hx CAD and CAD    Procedure:  Pharmacologic stress testing was performed with Regadenoson using a single  use 0.4mg /54ml (0.08 mg/ml) prefilled syringe intravenously infused as a  bolus dose over 10-15 seconds. The stress test was stopped due to Infusion  completion. Blood pressure response was normal. The patient did not  develop any symptoms other than fatigue during the procedure.   Rest HR: 61bpm Rest BP: 162/90mmHg Max HR: 88bpm Min BP: 162/44mmHg  Stress Test Administered by: Oswald Hillock, CMA  ECG Interpretation: Rest ECG: normal sinus rhythm, none Stress ECG: normal sinus rhythm,  Recovery ECG: normal sinus rhythm ECG Interpretation: non-diagnostic due to pharmacologic testing.   Administrations This Visit   regadenoson (LEXISCAN) 0.4 mg/5 mL inj syringe 0.4 mg   Admin Date 10/10/2018 Action Given Dose 0.4 mg Route Intravenous Administered By Herbert Seta, CNMT     technetium Tc52m sestamibi (CARDIOLITE) injection Q000111Q millicurie   Admin Date 10/10/2018 Action Given Dose Q000111Q millicurie Route Intravenous Administered By Herbert Seta, CNMT     technetium Tc40m sestamibi (CARDIOLITE) injection Q000111Q millicurie   Admin Date 10/10/2018 Action Given Dose Q000111Q millicurie Route Intravenous Administered By Herbert Seta, CNMT       Gated post-stress perfusion imaging was performed 30 minutes after stress.  Rest images were performed 30 minutes after injection.  Gated LV Analysis:   Summary of LV Perfusion: Abnormalischemia,  Summary of LV Function: Abnormal   TID Ratio: 1.12  LVEF= 38%  FINDINGS: Regional wall motion: demonstrates hypokinesis of the anterior, apical  septal walls.. The overall quality of the study is good.  Artifacts noted: no Left ventricular cavity: enlarged.  Perfusion Analysis: SPECT images demonstrate small perfusion abnormality  of mild intensity is present in the lateral region on the stress images.  Defect type : Reversible

## 2018-10-17 NOTE — Patient Instructions (Signed)
INSTRUCTIONS FOR SURGERY     Your surgery is scheduled for:        To find out your arrival time for the day of surgery,          please call [phone] between 1 pm and 3 pm on :     When you arrive for surgery, report to the [facility].       Do NOT stop on the first floor to register.    REMEMBER: Instructions that are not followed completely may result in serious medical risk,  up to and including death, or upon the discretion of your surgeon and anesthesiologist,            your surgery may need to be rescheduled.  __X__ 1. Do not eat food after midnight the night before your procedure.                    No gum, candy, lozenger, tic tacs, tums or hard candies.                  ABSOLUTELY NOTHING SOLID IN YOUR MOUTH AFTER MIDNIGHT                    You may drink unlimited clear liquids up to 2 hours before you are scheduled to arrive for surgery.                   Do not drink anything within those 2 hours unless you need to take medicine, then take the                   smallest amount you need.  Clear liquids include:  water, apple juice without pulp,                   any flavor Gatorade, Black coffee, black tea.  Sugar may be added but no dairy/ honey /lemon.                        Broth and jello is not considered a clear liquid.  __x__  2. On the morning of surgery, please brush your teeth with toothpaste and water. You may rinse with                  mouthwash if you wish but DO NOT SWALLOW TOOTHPASTE OR MOUTHWASH  __X___3. NO alcohol for 24 hours before or after surgery.  __x___ 4.  Do NOT smoke or use e-cigarettes for 24 HOURS PRIOR TO SURGERY.                      DO NOT Use any chewable tobacco products for at least 6 hours prior to surgery.  __x___ 5. If you start any new medication after this appointment and prior to surgery, please                   Bring it with you on the day of  surgery.  ___x__ 6. Notify your doctor if there is any change in your medical condition, such as fever,  infection, vomitting,                   Diarrhea or any open sores.  __x___ 7.  USE the CHG SOAP as instructed, the night before surgery and the day of surgery.                   Once you have washed with this soap, do NOT use any of the following: Powders, perfumes                    or lotions. Please do not wear make up, hairpins, clips or nail polish. You MAY wear deodorant.                   Men may shave their face and neck.  Women need to shave 48 hours prior to surgery.                   DO NOT wear ANY jewelry on the day of surgery. If there are rings that are too tight to                    remove easily, please address this prior to the surgery day. Piercings need to be removed.                                                                     NO METAL ON YOUR BODY.                    Do NOT bring any valuables.  If you came to Pre-Admit testing then you will not need license,                     insurance card or credit card.  If you will be staying overnight, please either leave your things in                     the car or have your family be responsible for these items.                     [facility] IS NOT RESPONSIBLE FOR BELONGINGS OR VALUABLES.  ___X__ 8. DO NOT wear contact lenses on surgery day.  You may not have dentures,                     Hearing aides, contacts or glasses in the operating room. These items can be                    Placed in the Recovery Room to receive immediately after surgery.  __x___ 9. IF YOU ARE SCHEDULED TO GO HOME ON THE SAME DAY, YOU MUST                   Have someone to drive you home and to stay with you  for the first 24 hours.                    Have an arrangement prior to arriving on surgery day.  ___x__ 10. Take the following medications on the morning of surgery with a  sip of water:                              1.   metoprolol                     2.                     3.                     4.                     5.                     6.  _____ 11.  Follow any instructions provided to you by your surgeon.                        Such as enema, clear liquid bowel prep  __X__  12. STOP  ASPIRIN AS OF:  ONE WEEK PRIOR TO SURGERY                       THIS INCLUDES BC POWDERS / GOODIES POWDER  __x___ 13. STOP Anti-inflammatories as of:  ONE WEEK PRIOR TO SURGERY                      This includes IBUPROFEN / MOTRIN / ADVIL / ALEVE/ NAPROXYN                    YOU MAY TAKE TYLENOL ANY TIME PRIOR TO SURGERY.  ___X__ 14.  Stop supplements until after surgery.                     This includes:  [place] may continue taking Vitamin B12 / Vitamin D3 but do not take on the morning of surgery.  _____ 15. Bring your CPAP machine into preop with you on the morning of surgery.  ___X___16.  Stop Metformin 2 full days prior to surgery.  Stop on:  Friday PRIOR TO SURGERY                                         Do NOT take any diabetes medications on surgery day.  __X____17.  Continue to take the following medications but do not take on the morning of surgery:                          TRIAL MEDICATION  ___X___18. If staying overnight, please have appropriate shoes to wear to be able to walk around the unit.                   Wear clean and comfortable clothing to the hospital.  Edith Endave SO WE CAN MAKE A COPY FOR YOUR CHART.  HAVE STOOL SOFTENERS AVAILABLE FOR HOME USE.  BRING A CHARGER FOR THE CELL PHONE.   TYLENOL .Marland KitchenMarland Kitchen MAXIMUM AMOUNT IS 4000MG  PER DAY.

## 2018-10-19 ENCOUNTER — Other Ambulatory Visit: Admission: RE | Admit: 2018-10-19 | Payer: PPO | Source: Ambulatory Visit

## 2018-10-19 DIAGNOSIS — R9439 Abnormal result of other cardiovascular function study: Secondary | ICD-10-CM | POA: Diagnosis not present

## 2018-10-19 DIAGNOSIS — I252 Old myocardial infarction: Secondary | ICD-10-CM | POA: Diagnosis not present

## 2018-10-19 DIAGNOSIS — I2510 Atherosclerotic heart disease of native coronary artery without angina pectoris: Secondary | ICD-10-CM | POA: Diagnosis not present

## 2018-10-25 DIAGNOSIS — Z789 Other specified health status: Secondary | ICD-10-CM | POA: Diagnosis not present

## 2018-10-25 DIAGNOSIS — I252 Old myocardial infarction: Secondary | ICD-10-CM | POA: Diagnosis not present

## 2018-10-25 DIAGNOSIS — I2510 Atherosclerotic heart disease of native coronary artery without angina pectoris: Secondary | ICD-10-CM | POA: Diagnosis not present

## 2018-10-25 DIAGNOSIS — E785 Hyperlipidemia, unspecified: Secondary | ICD-10-CM | POA: Diagnosis not present

## 2018-10-25 DIAGNOSIS — R9439 Abnormal result of other cardiovascular function study: Secondary | ICD-10-CM | POA: Diagnosis not present

## 2018-10-26 ENCOUNTER — Other Ambulatory Visit
Admission: RE | Admit: 2018-10-26 | Discharge: 2018-10-26 | Disposition: A | Discharge status: Home / Self Care | Payer: PPO | Source: Ambulatory Visit | Attending: Neurosurgery | Admitting: Neurosurgery

## 2018-10-26 ENCOUNTER — Other Ambulatory Visit: Payer: Self-pay

## 2018-10-26 DIAGNOSIS — Z20828 Contact with and (suspected) exposure to other viral communicable diseases: Secondary | ICD-10-CM | POA: Insufficient documentation

## 2018-10-26 DIAGNOSIS — Z01812 Encounter for preprocedural laboratory examination: Secondary | ICD-10-CM | POA: Insufficient documentation

## 2018-10-26 LAB — SARS CORONAVIRUS 2 (TAT 6-24 HRS): SARS Coronavirus 2: NEGATIVE

## 2018-10-30 ENCOUNTER — Observation Stay
Admission: RE | Admit: 2018-10-30 | Discharge: 2018-10-31 | Disposition: A | Discharge status: Home / Self Care | Payer: PPO | Attending: Neurosurgery | Admitting: Neurosurgery

## 2018-10-30 ENCOUNTER — Encounter
Admission: RE | Disposition: A | Discharge status: Home / Self Care | Payer: Self-pay | Source: Home / Self Care | Attending: Neurosurgery

## 2018-10-30 ENCOUNTER — Ambulatory Visit: Payer: PPO | Admitting: Anesthesiology

## 2018-10-30 ENCOUNTER — Ambulatory Visit: Payer: PPO

## 2018-10-30 ENCOUNTER — Other Ambulatory Visit: Payer: Self-pay

## 2018-10-30 DIAGNOSIS — M79605 Pain in left leg: Secondary | ICD-10-CM | POA: Diagnosis present

## 2018-10-30 DIAGNOSIS — I10 Essential (primary) hypertension: Secondary | ICD-10-CM | POA: Insufficient documentation

## 2018-10-30 DIAGNOSIS — Z7982 Long term (current) use of aspirin: Secondary | ICD-10-CM | POA: Diagnosis not present

## 2018-10-30 DIAGNOSIS — Z79899 Other long term (current) drug therapy: Secondary | ICD-10-CM | POA: Diagnosis not present

## 2018-10-30 DIAGNOSIS — Z7984 Long term (current) use of oral hypoglycemic drugs: Secondary | ICD-10-CM | POA: Insufficient documentation

## 2018-10-30 DIAGNOSIS — K219 Gastro-esophageal reflux disease without esophagitis: Secondary | ICD-10-CM | POA: Diagnosis not present

## 2018-10-30 DIAGNOSIS — I2510 Atherosclerotic heart disease of native coronary artery without angina pectoris: Secondary | ICD-10-CM | POA: Insufficient documentation

## 2018-10-30 DIAGNOSIS — I252 Old myocardial infarction: Secondary | ICD-10-CM | POA: Diagnosis not present

## 2018-10-30 DIAGNOSIS — F17210 Nicotine dependence, cigarettes, uncomplicated: Secondary | ICD-10-CM | POA: Insufficient documentation

## 2018-10-30 DIAGNOSIS — E785 Hyperlipidemia, unspecified: Secondary | ICD-10-CM | POA: Diagnosis not present

## 2018-10-30 DIAGNOSIS — E119 Type 2 diabetes mellitus without complications: Secondary | ICD-10-CM | POA: Insufficient documentation

## 2018-10-30 DIAGNOSIS — M48062 Spinal stenosis, lumbar region with neurogenic claudication: Principal | ICD-10-CM | POA: Insufficient documentation

## 2018-10-30 DIAGNOSIS — Z981 Arthrodesis status: Secondary | ICD-10-CM | POA: Diagnosis not present

## 2018-10-30 DIAGNOSIS — M48061 Spinal stenosis, lumbar region without neurogenic claudication: Secondary | ICD-10-CM | POA: Diagnosis present

## 2018-10-30 DIAGNOSIS — Z419 Encounter for procedure for purposes other than remedying health state, unspecified: Secondary | ICD-10-CM

## 2018-10-30 HISTORY — PX: LUMBAR LAMINECTOMY/ DECOMPRESSION WITH MET-RX: SHX5959

## 2018-10-30 LAB — GLUCOSE, CAPILLARY
Glucose-Capillary: 260 mg/dL — ABNORMAL HIGH (ref 70–99)
Glucose-Capillary: 289 mg/dL — ABNORMAL HIGH (ref 70–99)
Glucose-Capillary: 311 mg/dL — ABNORMAL HIGH (ref 70–99)
Glucose-Capillary: 339 mg/dL — ABNORMAL HIGH (ref 70–99)
Glucose-Capillary: 304 mg/dL — ABNORMAL HIGH (ref 70–99)

## 2018-10-30 LAB — ABO/RH: ABO/RH(D): A POS

## 2018-10-30 SURGERY — LUMBAR LAMINECTOMY/ DECOMPRESSION WITH MET-RX
Anesthesia: General

## 2018-10-30 MED ORDER — METHYLPREDNISOLONE ACETATE 40 MG/ML IJ SUSP
INTRAMUSCULAR | Status: AC
  Filled 2018-10-30: qty 1

## 2018-10-30 MED ORDER — THROMBIN 5000 UNITS EX SOLR
CUTANEOUS | Status: AC
  Filled 2018-10-30: qty 5000

## 2018-10-30 MED ORDER — SODIUM CHLORIDE 0.9% FLUSH
3.0000 mL | Freq: Two times a day (BID) | INTRAVENOUS | Status: DC
  Administered 2018-10-30: 3 mL via INTRAVENOUS

## 2018-10-30 MED ORDER — BUPIVACAINE HCL (PF) 0.5 % IJ SOLN
INTRAMUSCULAR | Status: AC
  Filled 2018-10-30: qty 30

## 2018-10-30 MED ORDER — INSULIN ASPART 100 UNIT/ML ~~LOC~~ SOLN
4.0000 [IU] | Freq: Once | SUBCUTANEOUS | Status: AC
  Administered 2018-10-30: 4 [IU] via SUBCUTANEOUS

## 2018-10-30 MED ORDER — OXYCODONE HCL 5 MG PO TABS
10.0000 mg | ORAL_TABLET | ORAL | Status: DC | PRN
  Administered 2018-10-30: 10 mg via ORAL
  Filled 2018-10-30: qty 2

## 2018-10-30 MED ORDER — LIDOCAINE-EPINEPHRINE (PF) 1 %-1:200000 IJ SOLN
INTRAMUSCULAR | Status: DC | PRN
  Administered 2018-10-30: 9 mL

## 2018-10-30 MED ORDER — GLYCOPYRROLATE 0.2 MG/ML IJ SOLN
INTRAMUSCULAR | Status: AC
  Filled 2018-10-30: qty 1

## 2018-10-30 MED ORDER — METFORMIN HCL 500 MG PO TABS
1000.0000 mg | ORAL_TABLET | Freq: Two times a day (BID) | ORAL | Status: DC
  Administered 2018-10-31: 1000 mg via ORAL
  Filled 2018-10-30: qty 2

## 2018-10-30 MED ORDER — PROMETHAZINE HCL 25 MG/ML IJ SOLN: 6.2500 mg | INTRAMUSCULAR | Status: DC | PRN

## 2018-10-30 MED ORDER — LIDOCAINE HCL (PF) 1 % IJ SOLN
INTRAMUSCULAR | Status: AC
  Filled 2018-10-30: qty 30

## 2018-10-30 MED ORDER — ONDANSETRON HCL 4 MG/2ML IJ SOLN
INTRAMUSCULAR | Status: AC
  Filled 2018-10-30: qty 2

## 2018-10-30 MED ORDER — ACETAMINOPHEN 10 MG/ML IV SOLN
INTRAVENOUS | Status: DC | PRN
  Administered 2018-10-30: 1000 mg via INTRAVENOUS

## 2018-10-30 MED ORDER — SODIUM CHLORIDE 0.9 % IV SOLN: 250.0000 mL | INTRAVENOUS | Status: DC

## 2018-10-30 MED ORDER — MEPERIDINE HCL 50 MG/ML IJ SOLN: 6.2500 mg | INTRAMUSCULAR | Status: DC | PRN

## 2018-10-30 MED ORDER — INSULIN ASPART 100 UNIT/ML ~~LOC~~ SOLN
0.0000 [IU] | Freq: Every day | SUBCUTANEOUS | Status: DC
  Administered 2018-10-30: 4 [IU] via SUBCUTANEOUS
  Filled 2018-10-30: qty 1

## 2018-10-30 MED ORDER — SENNA 8.6 MG PO TABS
1.0000 | ORAL_TABLET | Freq: Two times a day (BID) | ORAL | Status: DC
  Administered 2018-10-30 – 2018-10-31 (×2): 8.6 mg via ORAL
  Filled 2018-10-30 (×2): qty 1

## 2018-10-30 MED ORDER — FENTANYL CITRATE (PF) 100 MCG/2ML IJ SOLN
INTRAMUSCULAR | Status: DC | PRN
  Administered 2018-10-30: 100 ug via INTRAVENOUS

## 2018-10-30 MED ORDER — SUCCINYLCHOLINE CHLORIDE 20 MG/ML IJ SOLN
INTRAMUSCULAR | Status: DC | PRN
  Administered 2018-10-30: 100 mg via INTRAVENOUS

## 2018-10-30 MED ORDER — OXYCODONE HCL 5 MG PO TABS: 5.0000 mg | ORAL_TABLET | Freq: Once | ORAL | Status: DC | PRN

## 2018-10-30 MED ORDER — PROPOFOL 10 MG/ML IV BOLUS
INTRAVENOUS | Status: AC
  Filled 2018-10-30: qty 20

## 2018-10-30 MED ORDER — METHOCARBAMOL 500 MG PO TABS
500.0000 mg | ORAL_TABLET | Freq: Four times a day (QID) | ORAL | Status: DC | PRN
  Filled 2018-10-30: qty 1

## 2018-10-30 MED ORDER — SUGAMMADEX SODIUM 200 MG/2ML IV SOLN
INTRAVENOUS | Status: AC
  Filled 2018-10-30: qty 2

## 2018-10-30 MED ORDER — DEXAMETHASONE SODIUM PHOSPHATE 10 MG/ML IJ SOLN
INTRAMUSCULAR | Status: DC | PRN
  Administered 2018-10-30: 10 mg via INTRAVENOUS

## 2018-10-30 MED ORDER — ACETAMINOPHEN 650 MG RE SUPP: 650.0000 mg | RECTAL | Status: DC | PRN

## 2018-10-30 MED ORDER — SUGAMMADEX SODIUM 200 MG/2ML IV SOLN
INTRAVENOUS | Status: DC | PRN
  Administered 2018-10-30: 200 mg via INTRAVENOUS

## 2018-10-30 MED ORDER — ACETAMINOPHEN 10 MG/ML IV SOLN
INTRAVENOUS | Status: AC
  Filled 2018-10-30: qty 100

## 2018-10-30 MED ORDER — LIDOCAINE HCL (PF) 2 % IJ SOLN
INTRAMUSCULAR | Status: AC
  Filled 2018-10-30: qty 10

## 2018-10-30 MED ORDER — MENTHOL 3 MG MT LOZG
1.0000 | LOZENGE | OROMUCOSAL | Status: DC | PRN
  Filled 2018-10-30: qty 9

## 2018-10-30 MED ORDER — FAMOTIDINE 20 MG PO TABS
ORAL_TABLET | ORAL | Status: AC
  Administered 2018-10-30: 20 mg via ORAL
  Filled 2018-10-30: qty 1

## 2018-10-30 MED ORDER — METOPROLOL SUCCINATE ER 50 MG PO TB24
100.0000 mg | ORAL_TABLET | Freq: Every day | ORAL | Status: DC
  Administered 2018-10-31: 100 mg via ORAL
  Filled 2018-10-30: qty 2

## 2018-10-30 MED ORDER — METHOCARBAMOL 1000 MG/10ML IJ SOLN
500.0000 mg | Freq: Four times a day (QID) | INTRAVENOUS | Status: DC | PRN
  Filled 2018-10-30: qty 5

## 2018-10-30 MED ORDER — EPHEDRINE SULFATE 50 MG/ML IJ SOLN
INTRAMUSCULAR | Status: DC | PRN
  Administered 2018-10-30: 10 mg via INTRAVENOUS

## 2018-10-30 MED ORDER — METFORMIN HCL 500 MG PO TABS: 1000.0000 mg | ORAL_TABLET | Freq: Once | ORAL | Status: DC

## 2018-10-30 MED ORDER — SODIUM CHLORIDE 0.9 % IV SOLN
INTRAVENOUS | Status: DC
  Administered 2018-10-30: 07:00:00 via INTRAVENOUS

## 2018-10-30 MED ORDER — FAMOTIDINE 20 MG PO TABS
20.0000 mg | ORAL_TABLET | Freq: Once | ORAL | Status: AC
  Administered 2018-10-30: 07:00:00 20 mg via ORAL

## 2018-10-30 MED ORDER — PROPOFOL 10 MG/ML IV BOLUS
INTRAVENOUS | Status: DC | PRN
  Administered 2018-10-30: 150 mg via INTRAVENOUS

## 2018-10-30 MED ORDER — SODIUM CHLORIDE FLUSH 0.9 % IV SOLN
INTRAVENOUS | Status: AC
  Filled 2018-10-30: qty 10

## 2018-10-30 MED ORDER — THROMBIN 5000 UNITS EX SOLR
CUTANEOUS | Status: DC | PRN
  Administered 2018-10-30: 5000 [IU] via TOPICAL

## 2018-10-30 MED ORDER — ONDANSETRON HCL 4 MG PO TABS: 4.0000 mg | ORAL_TABLET | Freq: Four times a day (QID) | ORAL | Status: DC | PRN

## 2018-10-30 MED ORDER — MORPHINE SULFATE (PF) 2 MG/ML IV SOLN: 1.0000 mg | INTRAVENOUS | Status: DC | PRN

## 2018-10-30 MED ORDER — CEFAZOLIN SODIUM-DEXTROSE 2-4 GM/100ML-% IV SOLN
2.0000 g | Freq: Three times a day (TID) | INTRAVENOUS | Status: AC
  Administered 2018-10-30 (×2): 2 g via INTRAVENOUS
  Filled 2018-10-30 (×2): qty 100

## 2018-10-30 MED ORDER — ROCURONIUM BROMIDE 100 MG/10ML IV SOLN
INTRAVENOUS | Status: DC | PRN
  Administered 2018-10-30: 10 mg via INTRAVENOUS
  Administered 2018-10-30 (×2): 5 mg via INTRAVENOUS

## 2018-10-30 MED ORDER — KETAMINE HCL 50 MG/ML IJ SOLN
INTRAMUSCULAR | Status: AC
  Filled 2018-10-30: qty 10

## 2018-10-30 MED ORDER — SODIUM CHLORIDE 0.9 % IV SOLN
INTRAVENOUS | Status: DC
  Administered 2018-10-30: 13:00:00 via INTRAVENOUS

## 2018-10-30 MED ORDER — OXYCODONE HCL 5 MG/5ML PO SOLN: 5.0000 mg | Freq: Once | ORAL | Status: DC | PRN

## 2018-10-30 MED ORDER — FENTANYL CITRATE (PF) 100 MCG/2ML IJ SOLN: 25.0000 ug | INTRAMUSCULAR | Status: DC | PRN

## 2018-10-30 MED ORDER — NITROGLYCERIN 0.4 MG SL SUBL: 0.4000 mg | SUBLINGUAL_TABLET | SUBLINGUAL | Status: DC | PRN

## 2018-10-30 MED ORDER — LIDOCAINE-EPINEPHRINE 1 %-1:100000 IJ SOLN
INTRAMUSCULAR | Status: AC
  Filled 2018-10-30: qty 1

## 2018-10-30 MED ORDER — DEXAMETHASONE SODIUM PHOSPHATE 10 MG/ML IJ SOLN
INTRAMUSCULAR | Status: AC
  Filled 2018-10-30: qty 1

## 2018-10-30 MED ORDER — PNEUMOCOCCAL VAC POLYVALENT 25 MCG/0.5ML IJ INJ
0.5000 mL | INJECTION | INTRAMUSCULAR | Status: DC
  Filled 2018-10-30: qty 0.5

## 2018-10-30 MED ORDER — ONDANSETRON HCL 4 MG/2ML IJ SOLN
INTRAMUSCULAR | Status: DC | PRN
  Administered 2018-10-30: 4 mg via INTRAVENOUS

## 2018-10-30 MED ORDER — INSULIN ASPART 100 UNIT/ML ~~LOC~~ SOLN
0.0000 [IU] | Freq: Three times a day (TID) | SUBCUTANEOUS | Status: DC
  Administered 2018-10-30: 7 [IU] via SUBCUTANEOUS
  Administered 2018-10-31: 2 [IU] via SUBCUTANEOUS
  Filled 2018-10-30 (×2): qty 1

## 2018-10-30 MED ORDER — PHENOL 1.4 % MT LIQD
1.0000 | OROMUCOSAL | Status: DC | PRN
  Filled 2018-10-30: qty 177

## 2018-10-30 MED ORDER — CEFAZOLIN SODIUM-DEXTROSE 2-4 GM/100ML-% IV SOLN
2.0000 g | Freq: Once | INTRAVENOUS | Status: AC
  Administered 2018-10-30: 2 g via INTRAVENOUS

## 2018-10-30 MED ORDER — LIDOCAINE HCL (CARDIAC) PF 100 MG/5ML IV SOSY
PREFILLED_SYRINGE | INTRAVENOUS | Status: DC | PRN
  Administered 2018-10-30: 100 mg via INTRAVENOUS

## 2018-10-30 MED ORDER — SODIUM CHLORIDE 0.9% FLUSH: 3.0000 mL | INTRAVENOUS | Status: DC | PRN

## 2018-10-30 MED ORDER — CEFAZOLIN SODIUM-DEXTROSE 2-4 GM/100ML-% IV SOLN
INTRAVENOUS | Status: AC
  Filled 2018-10-30: qty 100

## 2018-10-30 MED ORDER — ONDANSETRON HCL 4 MG/2ML IJ SOLN: 4.0000 mg | Freq: Four times a day (QID) | INTRAMUSCULAR | Status: DC | PRN

## 2018-10-30 MED ORDER — ACETAMINOPHEN 325 MG PO TABS: 650.0000 mg | ORAL_TABLET | ORAL | Status: DC | PRN

## 2018-10-30 MED ORDER — OXYCODONE HCL 5 MG PO TABS: 5.0000 mg | ORAL_TABLET | ORAL | Status: DC | PRN

## 2018-10-30 MED ORDER — GLYCOPYRROLATE 0.2 MG/ML IJ SOLN
INTRAMUSCULAR | Status: DC | PRN
  Administered 2018-10-30: 0.2 mg via INTRAVENOUS

## 2018-10-30 MED ORDER — FENTANYL CITRATE (PF) 100 MCG/2ML IJ SOLN
INTRAMUSCULAR | Status: AC
  Filled 2018-10-30: qty 2

## 2018-10-30 MED ORDER — INSULIN ASPART 100 UNIT/ML ~~LOC~~ SOLN
SUBCUTANEOUS | Status: AC
  Filled 2018-10-30: qty 1

## 2018-10-30 MED ORDER — KETAMINE HCL 50 MG/ML IJ SOLN
INTRAMUSCULAR | Status: DC | PRN
  Administered 2018-10-30: 50 mg via INTRAMUSCULAR

## 2018-10-30 SURGICAL SUPPLY — 57 items
BUR NEURO DRILL SOFT 3.0X3.8M (BURR) ×3 IMPLANT
CANISTER SUCT 1200ML W/VALVE (MISCELLANEOUS) ×6 IMPLANT
CHLORAPREP W/TINT 26 (MISCELLANEOUS) ×6 IMPLANT
COUNTER NEEDLE 20/40 LG (NEEDLE) ×3 IMPLANT
COVER LIGHT HANDLE STERIS (MISCELLANEOUS) ×6 IMPLANT
COVER WAND RF STERILE (DRAPES) ×3 IMPLANT
CUP MEDICINE 2OZ PLAST GRAD ST (MISCELLANEOUS) ×3 IMPLANT
DERMABOND ADVANCED (GAUZE/BANDAGES/DRESSINGS) ×2
DERMABOND ADVANCED .7 DNX12 (GAUZE/BANDAGES/DRESSINGS) ×1 IMPLANT
DRAPE C-ARM 42X72 X-RAY (DRAPES) ×6 IMPLANT
DRAPE LAPAROTOMY 100X77 ABD (DRAPES) ×3 IMPLANT
DRAPE MICROSCOPE SPINE 48X150 (DRAPES) IMPLANT
DRAPE SURG 17X11 SM STRL (DRAPES) ×3 IMPLANT
DRSG OPSITE POSTOP 4X6 (GAUZE/BANDAGES/DRESSINGS) ×3 IMPLANT
DRSG TEGADERM 4X4.75 (GAUZE/BANDAGES/DRESSINGS) IMPLANT
DRSG TELFA 4X3 1S NADH ST (GAUZE/BANDAGES/DRESSINGS) IMPLANT
DURASEAL APPLICATOR TIP (TIP) IMPLANT
DURASEAL SPINE SEALANT 3ML (MISCELLANEOUS) IMPLANT
ELECT CAUTERY BLADE TIP 2.5 (TIP) ×3
ELECT EZSTD 165MM 6.5IN (MISCELLANEOUS) ×3
ELECT REM PT RETURN 9FT ADLT (ELECTROSURGICAL) ×3
ELECTRODE CAUTERY BLDE TIP 2.5 (TIP) ×1 IMPLANT
ELECTRODE EZSTD 165MM 6.5IN (MISCELLANEOUS) ×1 IMPLANT
ELECTRODE REM PT RTRN 9FT ADLT (ELECTROSURGICAL) ×1 IMPLANT
GAUZE SPONGE 4X4 12PLY STRL (GAUZE/BANDAGES/DRESSINGS) ×3 IMPLANT
GLOVE BIOGEL PI IND STRL 7.0 (GLOVE) ×1 IMPLANT
GLOVE BIOGEL PI INDICATOR 7.0 (GLOVE) ×2
GLOVE INDICATOR 8.0 STRL GRN (GLOVE) ×9 IMPLANT
GLOVE SURG SYN 7.0 (GLOVE) ×6 IMPLANT
GLOVE SURG SYN 8.0 (GLOVE) ×3 IMPLANT
GOWN STRL REUS W/ TWL XL LVL3 (GOWN DISPOSABLE) ×1 IMPLANT
GOWN STRL REUS W/TWL MED LVL3 (GOWN DISPOSABLE) ×3 IMPLANT
GOWN STRL REUS W/TWL XL LVL3 (GOWN DISPOSABLE) ×2
GRADUATE 1200CC STRL 31836 (MISCELLANEOUS) ×3 IMPLANT
KIT TURNOVER KIT A (KITS) ×3 IMPLANT
KIT WILSON FRAME (KITS) ×3 IMPLANT
KNIFE BAYONET SHORT DISCETOMY (MISCELLANEOUS) IMPLANT
MARKER SKIN DUAL TIP RULER LAB (MISCELLANEOUS) ×6 IMPLANT
NDL SAFETY ECLIPSE 18X1.5 (NEEDLE) ×1 IMPLANT
NEEDLE HYPO 18GX1.5 SHARP (NEEDLE) ×2
NEEDLE HYPO 22GX1.5 SAFETY (NEEDLE) ×3 IMPLANT
NS IRRIG 1000ML POUR BTL (IV SOLUTION) ×3 IMPLANT
PACK LAMINECTOMY NEURO (CUSTOM PROCEDURE TRAY) ×3 IMPLANT
PAD ARMBOARD 7.5X6 YLW CONV (MISCELLANEOUS) ×3 IMPLANT
SPOGE SURGIFLO 8M (HEMOSTASIS) ×2
SPONGE SURGIFLO 8M (HEMOSTASIS) ×1 IMPLANT
STAPLER SKIN PROX 35W (STAPLE) IMPLANT
SUT NURALON 4 0 TR CR/8 (SUTURE) IMPLANT
SUT POLYSORB 2-0 5X18 GS-10 (SUTURE) ×3 IMPLANT
SUT VIC AB 0 CT1 18XCR BRD 8 (SUTURE) ×4 IMPLANT
SUT VIC AB 0 CT1 8-18 (SUTURE) ×8
SYR 10ML LL (SYRINGE) ×6 IMPLANT
SYR 30ML LL (SYRINGE) ×3 IMPLANT
SYR 3ML LL SCALE MARK (SYRINGE) ×3 IMPLANT
TOWEL OR 17X26 4PK STRL BLUE (TOWEL DISPOSABLE) ×12 IMPLANT
TUBING CONNECTING 10 (TUBING) ×2 IMPLANT
TUBING CONNECTING 10' (TUBING) ×1

## 2018-10-30 NOTE — Anesthesia Procedure Notes (Signed)
Procedure Name: Intubation Performed by: Hedda Slade, CRNA Pre-anesthesia Checklist: Patient identified, Patient being monitored, Timeout performed, Emergency Drugs available and Suction available Patient Re-evaluated:Patient Re-evaluated prior to induction Oxygen Delivery Method: Circle system utilized Preoxygenation: Pre-oxygenation with 100% oxygen Induction Type: IV induction Ventilation: Mask ventilation without difficulty Laryngoscope Size: Mac and 4 Grade View: Grade I Tube type: Oral Tube size: 7.0 mm Number of attempts: 1 Airway Equipment and Method: Stylet Placement Confirmation: ETT inserted through vocal cords under direct vision,  positive ETCO2 and breath sounds checked- equal and bilateral Secured at: 21 cm Tube secured with: Tape Dental Injury: Teeth and Oropharynx as per pre-operative assessment

## 2018-10-30 NOTE — Op Note (Signed)
Operative Note  SURGERY DATE:10/30/2018  PRE-OP DIAGNOSIS: Lumbar Stenosis with Neurogenic Claudication(m48.06)  POST-OP DIAGNOSIS:Post-Op Diagnosis Codes: Lumbar Stenosiswith Neurogenic Claudication (m48.06)  Procedure(s) with comments: L4/5Laminectomy, Lateral recess decompression   SURGEON:  * Malen Gauze, MD     ANESTHESIA:General  OPERATIVE FINDINGS: Hypertrophied Ligament andLateral Recess Stenosis,Compressionat L4/5  OPERATIVE REPORT:   Indication: Mr. Manns to the clinic on9/15with ongoing bilateralleg painworsenedwith ambulation. He had failed conservative management including PT, injection, and prescription medications. MRI revealed stenosis at L4/5.Xrays showed no dynamic instability.Lumbardecompression was discussed to relieve symptoms.Therisks of surgery were explained to include hematoma, infection, damage to nerve roots, CSF leak, weakness, numbness, pain, need for future surgery including fusion, heart attack, and stroke.Heelected to proceed with surgery for symptom relief.   Procedure The patient was brought to the OR after informed consent was obtained.He was given general anesthesia and intubated by the anesthesia service. Vascular access lines were placed.The patient was then placed prone on a Wilson frameensuring all pressure points were padded. A time-out was performed per protocol.   The patient was sterilely prepped and draped.Theplanned midlineincision wasfound using fluoroscopy andmarked andinstilled withlocal anesthetic with epinephrine. The skin was opened sharply and the dissection taken to the fascia. This was incised and cautery was used to dissect the subperiosteal plane to expose the spinous processes and lamina of L4-5. Retractors were inserted and hemostasis was achieved. X-ray was used to confirm location.  Next, a matchstickdrill bit was used to remove the inferior  L4lamina and superior L5lamina to expose the ligament.The ligament was seen to be hypertrophied at this level.The underlying ligament was freed and removed with combination of rongeurs starting medially to lateral. The decompression was carried laterally where facet overgrowth and thick ligament was seen in the lateral recess. This was removed bilaterally.The dura was seen to expand as this was performed. The blunt tool was attempted to pass out thelateral recesses bilaterallyand found to be free as well.The bone edges were smooth and no obvious compression remained.  Once the dura appeared free from all the bone edges and all soft tissue compression was removed, hemostasis was obtained with Floseal and cautery. The wound was irrigated profusely. The muscle was then closed using 0 vicryl followed by the fascia with 0 vicryl. Next, multiple subcutaneous and dermal layers were closed with 2-0 vicryl until the epidermis was well approximated. The skin was closed withstaples.   The patient was returned to supine position and extubated by the anesthesia service. The patient was then taken to the PACU for post-operative care where he was moving extremities symmetrically.   ESTIMATED BLOOD LOSS: 50cc  SPECIMENS None  IMPLANT None   I performed the case in its entiretywithout assistance  Deetta Perla, [place]

## 2018-10-30 NOTE — TOC Initial Note (Signed)
Transition of Care (TOC) - Initial/Assessment Note    Patient Details  Name: Joshua Ferrell. MRN: [id number] Date of Birth: [birth year]  [facility] CM/SW Contact:    Sample, Lenice Llamas Phone Number: [phone]  10/30/2018, 2:27 PM  Clinical Narrative: Clinical Social Worker (CSW) met with patient to discuss D/C plan. Patient was alert and oriented X4 and was laying in the bed. CSW introduced self and explained role of CSW department. Per patient he lives alone in [place]. Patient reported that his daughter Rise Paganini and friend Lanny Hurst will be providing support when he gets home. Per patient he plans on going home. Patient reported he may have a walker but he does not know where it is located. Patient reported that he does not have a bedside commode but he believes he does not need one. CSW explained that PT will evaluate him and make a recommendation for DME. Patient verbalized his understanding. CSW will continue to follow and assist as needed.                   Expected Discharge Plan: Home/Self Care Barriers to Discharge: Continued Medical Work up   Patient Goals and CMS Choice Patient states their goals for this hospitalization and ongoing recovery are:: To go home.      Expected Discharge Plan and Services Expected Discharge Plan: Home/Self Care In-house Referral: Clinical Social Work     Living arrangements for the past 2 months: Single Family Home                                      Prior Living Arrangements/Services Living arrangements for the past 2 months: Single Family Home Lives with:: Self Patient language and need for interpreter reviewed:: No Do you feel safe going back to the place where you live?: Yes      Need for Family Participation in Patient Care: No (Comment) Care giver support system in place?: Yes (comment)   Criminal Activity/Legal Involvement Pertinent to Current Situation/Hospitalization: No - Comment as  needed  Activities of Daily Living Home Assistive Devices/Equipment: Eyeglasses ADL Screening (condition at time of admission) Patient's cognitive ability adequate to safely complete daily activities?: Yes Is the patient deaf or have difficulty hearing?: No Does the patient have difficulty seeing, even when wearing glasses/contacts?: No Does the patient have difficulty concentrating, remembering, or making decisions?: No Patient able to express need for assistance with ADLs?: Yes Does the patient have difficulty dressing or bathing?: No Independently performs ADLs?: Yes (appropriate for developmental age) Does the patient have difficulty walking or climbing stairs?: No Weakness of Legs: None Weakness of Arms/Hands: None  Permission Sought/Granted                  Emotional Assessment Appearance:: Appears stated age Attitude/Demeanor/Rapport: Engaged Affect (typically observed): Pleasant, Calm Orientation: : Oriented to Self, Oriented to Place, Oriented to  Time, Oriented to Situation Alcohol / Substance Use: Not Applicable Psych Involvement: No (comment)  Admission diagnosis:  Spinal stenosis of lumbar region with neurogenic claudication M48.062 Patient Active Problem List   Diagnosis Date Noted  . Lumbar stenosis 10/30/2018   PCP:  Kirk Ruths, MD Pharmacy:   [facility] [address]), [place])  [address] Phone: [phone] Fax: [phone]     Social Determinants of Health (SDOH) Interventions  Readmission Risk Interventions No flowsheet data found.

## 2018-10-30 NOTE — Anesthesia Postprocedure Evaluation (Signed)
Anesthesia Post Note  Patient: Joshua Ferrell.  Procedure(s) Performed: L4-5 LUMBAR LAMINECTOMY (N/A )  Patient location during evaluation: PACU Anesthesia Type: General Level of consciousness: awake and alert and oriented Pain management: pain level controlled Vital Signs Assessment: post-procedure vital signs reviewed and stable Respiratory status: spontaneous breathing, nonlabored ventilation and respiratory function stable Cardiovascular status: blood pressure returned to baseline and stable Postop Assessment: no signs of nausea or vomiting Anesthetic complications: no     Last Vitals:  Vitals:   10/30/18 1034 10/30/18 1140  BP: 135/78 136/80  Pulse: 60 62  Resp:  16  Temp: 36.5 C 36.4 C  SpO2: 95% 96%    Last Pain:  Vitals:   10/30/18 1140  TempSrc: Oral  PainSc:                  Clarissia Mckeen

## 2018-10-30 NOTE — H&P (Signed)
Joshua Fetter Pedrito Shellhorn. is an 72 y.o. male.   Chief Complaint: Leg pain with ambulation HPI: Joshua Ferrell is here for evaluation of ongoing symptoms of posterior leg pain and cramping that goes down to his calves bilaterally. He does feel like the left sometimes is worse than the right. He notices this within minutes of standing and it is fully relieved with sitting. He did have similar symptoms of this when he had his previous surgery which consisted of an L1 to decompression. He did well from that surgery with resolution of the leg symptoms at that time. He does have some back pain that is worse with walking but he is very active. He would like to continue with his activities but the leg symptoms are currently limiting him. He did undergo an injection but this did not seem to help this. He had an MRI of the lumbar spine that showed severe stenosis at L4/5 and he would like to proceed with decompression   Past Medical History:  Diagnosis Date  . Arthritis   . Coronary artery disease   . Diabetes mellitus without complication ([place])   . Dyspnea    with exertion  . GERD (gastroesophageal reflux disease)    minimal episodes  . Headache(784.0)   . Heart disease   . History of blood transfusion 1990   internal injuries after a fall requiring blood  . History of colonic polyps   . Hyperglycemia   . Hyperlipidemia   . Hypertension   . Myocardial infarction ([place]) 2003  . PONV (postoperative nausea and vomiting)    one time only    Past Surgical History:  Procedure Laterality Date  . ABDOMINAL SURGERY     patient unaware of this surgery  . BACK SURGERY    . CATARACT EXTRACTION W/PHACO Right 08/13/2015   Procedure: CATARACT EXTRACTION PHACO AND INTRAOCULAR LENS PLACEMENT (IOC);  Surgeon: Estill Cotta, MD;  Location: [facility] ORS;  Service: Ophthalmology;  Laterality: Right;  [place] 01:08 AP% 24.9 CDE 32.40 fluid pack lot # [id number]  . COLONOSCOPY    . COLONOSCOPY WITH PROPOFOL N/A 08/24/2017   Procedure: COLONOSCOPY WITH PROPOFOL;  Surgeon: Manya Silvas, MD;  Location: [facility] ENDOSCOPY;  Service: Endoscopy;  Laterality: N/A;  . CORONARY ANGIOPLASTY     one stent placed  . EYE SURGERY     cataract surgery  . LUMBAR LAMINECTOMY/DECOMPRESSION MICRODISCECTOMY Bilateral 10/23/2012   Procedure: LUMBAR LAMINECTOMY/DECOMPRESSION MICRODISCECTOMY 1 LEVEL;  Surgeon: Otilio Connors, MD;  Location: [place] NEURO ORS;  Service: Neurosurgery;  Laterality: Bilateral;  LUMBAR LAMINECTOMY/DECOMPRESSION MICRODISCECTOMY 1 LEVEL  . PENILE PROSTHESIS IMPLANT  1994   x3  . right shoulder arthroscopic    . SHOULDER SURGERY    . SUPRAPUBIC CATHETER PLACEMENT     and removal  . URETHERAL RE-IMPLANTATION      Family History  Problem Relation Age of Onset  . CAD Mother   . Heart attack Father    Social History:  reports that he has been smoking. He has been smoking about 0.00 packs per day for the past 0.00 years. His smokeless tobacco use includes chew. He reports current alcohol use of about 5.0 standard drinks of alcohol per week. He reports that he does not use drugs.  Allergies:  Allergies  Allergen Reactions  . Statins     Muscle pain   . Celebrex [Celecoxib] Itching    Medications Prior to Admission  Medication Sig Dispense Refill  . aspirin EC 81 MG tablet Take  81 mg by mouth daily.    . Investigational - Study Medication Take 14 mg by mouth daily before breakfast. Study name: semaglutide 14 mg or placebo    . metFORMIN (GLUCOPHAGE) 500 MG tablet Take 1,000 mg by mouth 2 (two) times daily with a meal.    . metoprolol succinate (TOPROL-XL) 100 MG 24 hr tablet Take 100 mg by mouth daily. Take with or immediately following a meal.    . naproxen sodium (ALEVE) 220 MG tablet Take 440 mg by mouth daily as needed (back pain).    . nitroGLYCERIN (NITROSTAT) 0.4 MG SL tablet Place 0.4 mg under the tongue every 5 (five) minutes as needed for chest pain.    . Omega-3 Fatty Acids (FISH OIL PO) Take  2,000 mg by mouth 2 (two) times daily.      No results found for this or any previous visit (from the past 48 hour(s)). No results found.  ROS General ROS: Negative Psychological ROS: Negative Ophthalmic ROS: Negative ENT ROS: Negative Hematological and Lymphatic ROS: Negative  Endocrine ROS: Negative Respiratory ROS: Negative Cardiovascular ROS: Negative Gastrointestinal ROS: Negative Genito-Urinary ROS: Negative Musculoskeletal ROS: Positive for back pain Neurological ROS: Positive for leg pain Dermatological ROS: Negative  Blood pressure (!) 166/91, pulse 62, temperature (!) 97.4 F (36.3 C), temperature source Tympanic, resp. rate (!) 106, height 5\' 8"  (1.727 m), weight 100 kg, SpO2 97 %. Physical Exam  General appearance: Alert, cooperative, in no acute distress Head: Normocephalic, atraumatic Eyes: Normal, EOM intact Oropharynx: Wearing facemask Back: Well-healed midline incision CV: Regular rate Pulm: Clear to auscultation Ext: No edema in LE bilaterally, warm extremities  Neurologic exam:  Mental status: alertness: alert, affect: normal Speech: fluent and clear Motor:strength symmetric 5/5 in bilateral lower extremities in all motor groups Sensory: intact to light touch in bilateral lower extremities Gait: normal   MRI lumbar spine: There is some loss of lordotic curvature. There is collapse of the space at L1-2 with prior posterior decompression and an adequate thecal sac at this level. There is a mild listhesis at L4-5 which does result in some stenosis that is moderate to severe at this level. The remainder of the levels show no significant stenosis.   Assessment/Plan L4/5 stenosis with neurogenic claudication  - We will proceed with L4/5 decompression  Deetta Perla, MD 10/30/2018, 6:34 AM

## 2018-10-30 NOTE — Anesthesia Post-op Follow-up Note (Signed)
Anesthesia QCDR form completed.        

## 2018-10-30 NOTE — Progress Notes (Signed)
Pt CBG: 289. Dr. Kayleen Memos notified. Acknowledged. Orders received.

## 2018-10-30 NOTE — Transfer of Care (Signed)
Immediate Anesthesia Transfer of Care Note  Patient: Joshua Ferrell.  Procedure(s) Performed: L4-5 LUMBAR LAMINECTOMY (N/A )  Patient Location: PACU  Anesthesia Type:General  Level of Consciousness: sedated  Airway & Oxygen Therapy: Patient Spontanous Breathing and Patient connected to face mask oxygen  Post-op Assessment: Report given to RN and Post -op Vital signs reviewed and stable  Post vital signs: Reviewed  Last Vitals:  Vitals Value Taken Time  BP 114/66 10/30/18 0925  Temp    Pulse 68 10/30/18 0925  Resp 14 10/30/18 0925  SpO2 99 % 10/30/18 0925  Vitals shown include unvalidated device data.  Last Pain:  Vitals:   10/30/18 0615  TempSrc: Tympanic         Complications: No apparent anesthesia complications

## 2018-10-30 NOTE — Care Management Obs Status (Signed)
[facility] NOTIFICATION   Patient Details  Name: Joshua Ferrell. MRN: [id number] Date of Birth: 07-Aug-1945   Medicare Observation Status Notification Given:  Yes    Leevon Upperman, Veronia Beets, LCSW 10/30/2018, 2:22 PM

## 2018-10-30 NOTE — Anesthesia Preprocedure Evaluation (Signed)
Anesthesia Evaluation  Patient identified by MRN, date of birth, ID band Patient awake    Reviewed: Allergy & Precautions, NPO status , Patient's Chart, lab work & pertinent test results  History of Anesthesia Complications (+) PONV and history of anesthetic complications  Airway Mallampati: II  TM Distance: >3 FB Neck ROM: Full    Dental  (+) Poor Dentition   Pulmonary neg sleep apnea, neg COPD, Current Smoker and Patient abstained from smoking.,    breath sounds clear to auscultation- rhonchi (-) wheezing      Cardiovascular hypertension, (-) angina+ CAD, + Past MI and + Cardiac Stents (2003)   Rhythm:Regular Rate:Normal - Systolic murmurs and - Diastolic murmurs    Neuro/Psych  Headaches, neg Seizures negative psych ROS   GI/Hepatic Neg liver ROS, GERD  ,  Endo/Other  diabetes, Oral Hypoglycemic Agents  Renal/GU negative Renal ROS     Musculoskeletal  (+) Arthritis ,   Abdominal (+) + obese,   Peds  Hematology negative hematology ROS (+)   Anesthesia Other Findings Past Medical History: No date: Arthritis No date: Coronary artery disease No date: Diabetes mellitus without complication ([facility]) No date: Dyspnea     Comment:  with exertion No date: GERD (gastroesophageal reflux disease)     Comment:  minimal episodes No date: Headache(784.0) No date: Heart disease 1990: History of blood transfusion     Comment:  internal injuries after a fall requiring blood No date: History of colonic polyps No date: Hyperglycemia No date: Hyperlipidemia No date: Hypertension 2003: Myocardial infarction ([place]) No date: PONV (postoperative nausea and vomiting)     Comment:  one time only   Reproductive/Obstetrics                             Anesthesia Physical Anesthesia Plan  ASA: III  Anesthesia Plan: General   Post-op Pain Management:    Induction: Intravenous  PONV Risk Score and  Plan: 1 and Ondansetron and Dexamethasone  Airway Management Planned: Oral ETT  Additional Equipment:   Intra-op Plan:   Post-operative Plan: Extubation in OR  Informed Consent: I have reviewed the patients History and Physical, chart, labs and discussed the procedure including the risks, benefits and alternatives for the proposed anesthesia with the patient or authorized representative who has indicated his/her understanding and acceptance.     Dental advisory given  Plan Discussed with: CRNA and Anesthesiologist  Anesthesia Plan Comments:         Anesthesia Quick Evaluation

## 2018-10-30 NOTE — Progress Notes (Signed)
Inpatient Diabetes Program Recommendations  AACE/ADA: New Consensus Statement on Inpatient Glycemic Control   Target Ranges:  Prepandial:   less than 140 mg/dL      Peak postprandial:   less than 180 mg/dL (1-2 hours)      Critically ill patients:  140 - 180 mg/dL   Results for Joshua Ferrell, Joshua Ferrell (MRN [id number]) as of 10/30/2018 11:07  Ref. Range 10/30/2018 06:33 10/30/2018 09:27 10/30/2018 10:07  Glucose-Capillary Latest Ref Range: 70 - 99 mg/dL 260 (H) 289 (H) 311 (H)   Review of Glycemic Control  Diabetes history: DM2 Outpatient Diabetes medications: Metformin 1000 mg BID, Study medication Placebo or Semaglutide 14 mg daily Current orders for Inpatient glycemic control: Metformin 1000 mg BID  Inpatient Diabetes Program Recommendations:   Insulin-Basal: Please consider ordering one time Lantus 10 units x1 now (based on 100 kg x 0.1 units).  Insulin-Correction: Please consider ordering CBGs AC&HS with Novolog 0-9 units TID with meals and Novolog 0-5 units QHS.  NOTE: In reviewing chart, noted patient received Decadron 10 mg x1 today which is contributing to hyperglycemia. Recommend ordering one time low dose basal insulin and ordering Novolog correction scale while inpatient.   Thanks, Barnie Alderman, RN, MSN, CDE Diabetes Coordinator Inpatient Diabetes Program [phone] (Team Pager from 8am to 5pm)

## 2018-10-30 NOTE — Interval H&P Note (Signed)
History and Physical Interval Note:  10/30/2018 6:37 AM  Joshua Ferrell.  has presented today for surgery, with the diagnosis of Spinal stenosis of lumbar region with neurogenic claudication M48.062.  The various methods of treatment have been discussed with the patient and family. After consideration of risks, benefits and other options for treatment, the patient has consented to  Procedure(s): LUMBAR LAMINECTOMY/ DECOMPRESSION WITH MET-RX (N/A) as a surgical intervention.  The patient's history has been reviewed, patient examined, no change in status, stable for surgery.  I have reviewed the patient's chart and labs.  Questions were answered to the patient's satisfaction.     Deetta Perla

## 2018-10-31 DIAGNOSIS — M48062 Spinal stenosis, lumbar region with neurogenic claudication: Secondary | ICD-10-CM | POA: Diagnosis not present

## 2018-10-31 LAB — GLUCOSE, CAPILLARY
Glucose-Capillary: 245 mg/dL — ABNORMAL HIGH (ref 70–99)
Glucose-Capillary: 288 mg/dL — ABNORMAL HIGH (ref 70–99)

## 2018-10-31 MED ORDER — OXYCODONE HCL 5 MG PO TABS: 5.0000 mg | ORAL_TABLET | ORAL | 0 refills | Status: DC | PRN

## 2018-10-31 MED ORDER — METHOCARBAMOL 500 MG PO TABS: 500.0000 mg | ORAL_TABLET | Freq: Three times a day (TID) | ORAL | 0 refills | Status: DC | PRN

## 2018-10-31 NOTE — Discharge Instructions (Signed)
NEUROSURGERY DISCHARGE INSTRUCTIONS  Admission diagnosis: Spinal stenosis of lumbar region with neurogenic claudication M48.062  Operative procedure: L4/5 Laminectomy  What to do after you leave the hospital:  Recommended diet: diabetic diet. Increase protein intake to promote wound healing.  Recommended activity: no lifting, driving, or strenuous exercise for 2 weeks. You should be walking daily and with progression. Can wear lumbar brace as needed for comfort.  Medications: You may resume your home medications except hold Aspirin for 7 days after surgery, start 10/19.  You may take Tylenol as needed with your Naproxen.  We have prescribed Oxycodone and Methocarbamol as needed for pain, only take if pain is severe. Please take stool softeners if taking these medications.   Wound Care: Please clean wound daily. Keep hands and others away from incision. Keep incision open to the air and only place dressing if needed to keep clean.    Special Instructions  No straining, no heavy lifting > 10lbs x 2 weeks.  Keep incision area clean and dry. Clean incision with 50% Hydrogen peroxide and 50% water solution once a day.  May Shower tomorrow, but no bath or soaking in tub for6weeks.  Staples to be removed in 14 days.   Please Report any of the following: Nausea or Vomiting, Temperature is greater than 101.38F (38.1C) degrees, Dizziness, Abdominal Pain, Difficulty Breathing or Shortness of Breath, Inability to Eat, drink Fluids, or Take medications, Bleeding, swelling, or drainage from surgical incision sites, New numbness or weakness, and Bowel or bladder dysfunction to the neurosurgery resident on-call at [phone].  Additional Follow up appointments You have a follow up with [facility] clinic neurosurgery on 10/27 at 10:45.

## 2018-10-31 NOTE — Progress Notes (Signed)
   Progress Note   Date: 10/31/2018  Subjective: Joshua Ferrell is POD#1 from L4/5 laminectomy. His pain is tolerable and described as throbbing in back. He is not having any new leg symptoms. He has voided and tolerating a diet. He has stood in the room and will walk this morning.    Vital Signs: Temp:  [97.3 F (36.3 C)-99.3 F (37.4 C)] 97.3 F (36.3 C) (10/13 0356) Pulse Rate:  [60-71] 68 (10/13 0356) Resp:  [10-20] 18 (10/13 0356) BP: (114-153)/(66-87) 149/68 (10/13 0356) SpO2:  [95 %-100 %] 98 % (10/13 0356) Temp (24hrs), Avg:98 F (36.7 C), Min:97.3 F (36.3 C), Max:99.3 F (37.4 C)  Weight: 100 kg   Problem List Patient Active Problem List   Diagnosis Date Noted  . Lumbar stenosis 10/30/2018    Medications: Scheduled Meds: . insulin aspart  0-5 Units Subcutaneous QHS  . insulin aspart  0-9 Units Subcutaneous TID WC  . metFORMIN  1,000 mg Oral BID WC  . metoprolol succinate  100 mg Oral Daily  . pneumococcal 23 valent vaccine  0.5 mL Intramuscular Tomorrow-1000  . senna  1 tablet Oral BID  . sodium chloride flush  3 mL Intravenous Q12H   Continuous Infusions: . sodium chloride    . sodium chloride Stopped (10/30/18 1659)  . methocarbamol (ROBAXIN) IV     PRN Meds:.acetaminophen **OR** acetaminophen, menthol-cetylpyridinium **OR** phenol, methocarbamol **OR** methocarbamol (ROBAXIN) IV, morphine injection, nitroGLYCERIN, ondansetron **OR** ondansetron (ZOFRAN) IV, oxyCODONE, oxyCODONE, sodium chloride flush  Labs:  Lab Results  Component Value Date   WBC 5.4 10/17/2018   WBC 11.2 (H) 10/20/2012   HCT 41.0 10/17/2018   HCT 41.9 10/20/2012   PLT 151 10/17/2018   PLT 198 10/20/2012    Lab Results  Component Value Date   INR 1.0 10/17/2018   APTT 29 10/17/2018   Lab Results  Component Value Date   NA 136 10/17/2018   NA 134 (L) 10/20/2012   K 4.1 10/17/2018   K 4.2 10/20/2012   BUN 17 10/17/2018   BUN 23 10/20/2012   No results found for:  MG  Physical Exam: Patient lying in bed 5/5 strength noted in bilateral lower extremities Sensation intact in lower extremities Dressing changed, staples intact   A/P: Joshua Ferrell is doing well POD#1 from L4/5 laminectomy.  - Ambulate with nursing today - Continue Oxycodone, Tylenol, Methocarbamol - Discharge home   Deetta Perla, MD [phone]

## 2018-10-31 NOTE — Progress Notes (Signed)
D: Pt alert and oriented. Pt denies experiencing any pain at this time.   A: Pt received discharge and medication education/information. Pt took TED hose home with him.   R: Pt verbalized understanding of discharge and medication education/information.  Pt escorted via wheelchair by staff to front lobby where pt's friend picked him up.

## 2018-10-31 NOTE — Discharge Summary (Addendum)
Physician Discharge Summary  Patient ID: Joshua Ferrell. MRN: [id number] DOB/AGE: 75-17-1947 75 y.o.  Admit date: 10/30/2018 Discharge date: 10/31/2018  Admission Diagnoses: Active Problems:   Lumbar stenosis  Discharge Diagnoses:  Active Problems:   Lumbar stenosis   Discharged Condition: good  Hospital Course: Joshua Ferrell was taken for surgery on 10/12 for a L4/5 laminectomy. This was without complication and he was taken to the ward for post-operative care at his neurological baseline. There, he was seen to ambulate, void urine, and tolerate a diet. His pain was controlled and his neurological exam showed intact strength and sensation. Overnigth, there were no issues. Given this, he was recommended for home discharge on 10/13.   Consults: None   Discharge Exam: Blood pressure (!) 155/69, pulse 72, temperature 98.3 F (36.8 C), temperature source Oral, resp. rate 18, height 5\' 8"  (1.727 m), weight 100 kg, SpO2 96 %. Patient lying in bed 5/5 strength noted in bilateral lower extremities Sensation intact in lower extremities Dressing changed, staples intact   Disposition: Discharge disposition: 01-Home or Self Care       Discharge Instructions    Diet - low sodium heart healthy   Complete by: As directed    Incentive spirometry RT   Complete by: As directed    Increase activity slowly   Complete by: As directed      Allergies as of 10/31/2018      Reactions   Statins    Muscle pain   Celebrex [celecoxib] Itching      Medication List    TAKE these medications   aspirin EC 81 MG tablet Take 81 mg by mouth daily.   FISH OIL PO Take 2,000 mg by mouth 2 (two) times daily.   Investigational - Study Medication Take 14 mg by mouth daily before breakfast. Study name: semaglutide 14 mg or placebo   metFORMIN 500 MG tablet Commonly known as: GLUCOPHAGE Take 1,000 mg by mouth 2 (two) times daily with a meal.   methocarbamol 500 MG tablet Commonly  known as: ROBAXIN Take 1 tablet (500 mg total) by mouth every 8 (eight) hours as needed for muscle spasms.   methocarbamol 500 MG tablet Commonly known as: Robaxin Take 1 tablet (500 mg total) by mouth every 8 (eight) hours as needed for muscle spasms.   metoprolol succinate 100 MG 24 hr tablet Commonly known as: TOPROL-XL Take 100 mg by mouth daily. Take with or immediately following a meal.   naproxen sodium 220 MG tablet Commonly known as: ALEVE Take 440 mg by mouth daily as needed (back pain).   nitroGLYCERIN 0.4 MG SL tablet Commonly known as: NITROSTAT Place 0.4 mg under the tongue every 5 (five) minutes as needed for chest pain.   oxyCODONE 5 MG immediate release tablet Commonly known as: Oxy IR/ROXICODONE Take 1 tablet (5 mg total) by mouth every 4 (four) hours as needed for moderate pain ((score 4 to 6)).        Signed: Deetta Perla 10/31/2018, 8:21 AM

## 2018-11-20 DIAGNOSIS — E1121 Type 2 diabetes mellitus with diabetic nephropathy: Secondary | ICD-10-CM | POA: Diagnosis not present

## 2018-11-20 DIAGNOSIS — I2510 Atherosclerotic heart disease of native coronary artery without angina pectoris: Secondary | ICD-10-CM | POA: Diagnosis not present

## 2018-11-20 DIAGNOSIS — I10 Essential (primary) hypertension: Secondary | ICD-10-CM | POA: Diagnosis not present

## 2018-11-27 DIAGNOSIS — G720 Drug-induced myopathy: Secondary | ICD-10-CM | POA: Diagnosis not present

## 2018-11-27 DIAGNOSIS — I10 Essential (primary) hypertension: Secondary | ICD-10-CM | POA: Diagnosis not present

## 2018-11-27 DIAGNOSIS — Z23 Encounter for immunization: Secondary | ICD-10-CM | POA: Diagnosis not present

## 2018-11-27 DIAGNOSIS — I2510 Atherosclerotic heart disease of native coronary artery without angina pectoris: Secondary | ICD-10-CM | POA: Diagnosis not present

## 2018-11-27 DIAGNOSIS — Z0000 Encounter for general adult medical examination without abnormal findings: Secondary | ICD-10-CM | POA: Diagnosis not present

## 2018-11-27 DIAGNOSIS — T466X5A Adverse effect of antihyperlipidemic and antiarteriosclerotic drugs, initial encounter: Secondary | ICD-10-CM | POA: Diagnosis not present

## 2018-11-27 DIAGNOSIS — E1121 Type 2 diabetes mellitus with diabetic nephropathy: Secondary | ICD-10-CM | POA: Diagnosis not present

## 2018-12-19 DIAGNOSIS — U071 COVID-19: Secondary | ICD-10-CM

## 2018-12-19 HISTORY — DX: COVID-19: U07.1

## 2018-12-31 ENCOUNTER — Encounter: Payer: Self-pay | Admitting: Emergency Medicine

## 2018-12-31 ENCOUNTER — Other Ambulatory Visit: Payer: Self-pay

## 2018-12-31 ENCOUNTER — Ambulatory Visit
Admission: EM | Admit: 2018-12-31 | Discharge: 2018-12-31 | Disposition: A | Discharge status: Home / Self Care | Payer: PPO | Attending: Family Medicine | Admitting: Family Medicine

## 2018-12-31 DIAGNOSIS — Z20828 Contact with and (suspected) exposure to other viral communicable diseases: Secondary | ICD-10-CM | POA: Diagnosis not present

## 2018-12-31 DIAGNOSIS — Z8616 Personal history of COVID-19: Secondary | ICD-10-CM

## 2018-12-31 DIAGNOSIS — R059 Cough, unspecified: Secondary | ICD-10-CM

## 2018-12-31 DIAGNOSIS — R05 Cough: Secondary | ICD-10-CM

## 2018-12-31 DIAGNOSIS — M791 Myalgia, unspecified site: Secondary | ICD-10-CM | POA: Diagnosis not present

## 2018-12-31 DIAGNOSIS — R52 Pain, unspecified: Secondary | ICD-10-CM

## 2018-12-31 DIAGNOSIS — Z20822 Contact with and (suspected) exposure to covid-19: Secondary | ICD-10-CM

## 2018-12-31 HISTORY — DX: Personal history of COVID-19: Z86.16

## 2018-12-31 NOTE — Discharge Instructions (Signed)
Tylenol as needed.  Test results available in 24-48 hours.  Stay home.  Take care  Dr. Lacinda Axon

## 2018-12-31 NOTE — ED Provider Notes (Signed)
[facility]    CSN: [id number] Arrival date & time: 12/31/18  0827  History   Chief Complaint Chief Complaint  Patient presents with  . Fever  . Cough   HPI  73 year old male presents with fever, body aches, cough.  Patient reports that his symptoms started 2 days ago.  Patient reports low-grade temperatures, T-max 99.9.  No true fever documented.  He reports body aches and intermittent cough.  No other respiratory symptoms.  Patient states that he feels better today.  He is unsure of true Covid exposure.  He states that he was around someone recently who was tested.  He is unsure of the result.  He has taken Tylenol primarily for his arthritis.  He is currently afebrile.  No other medications or interventions tried.  No known exacerbating factors.  No other complaints.  PMH, Surgical Hx, Family Hx, Social History reviewed and updated as below.  Past Medical History:  Diagnosis Date  . Arthritis   . Coronary artery disease   . Diabetes mellitus without complication ([place])   . Dyspnea    with exertion  . GERD (gastroesophageal reflux disease)    minimal episodes  . Headache(784.0)   . Heart disease   . History of blood transfusion 1990   internal injuries after a fall requiring blood  . History of colonic polyps   . Hyperglycemia   . Hyperlipidemia   . Hypertension   . Myocardial infarction ([place]) 2003  . PONV (postoperative nausea and vomiting)    one time only    Patient Active Problem List   Diagnosis Date Noted  . Lumbar stenosis 10/30/2018    Past Surgical History:  Procedure Laterality Date  . ABDOMINAL SURGERY     patient unaware of this surgery  . BACK SURGERY    . CATARACT EXTRACTION W/PHACO Right 08/13/2015   Procedure: CATARACT EXTRACTION PHACO AND INTRAOCULAR LENS PLACEMENT (IOC);  Surgeon: Estill Cotta, MD;  Location: [facility] ORS;  Service: Ophthalmology;  Laterality: Right;  [place] 01:08 AP% 24.9 CDE 32.40 fluid pack lot # [id number]  .  COLONOSCOPY    . COLONOSCOPY WITH PROPOFOL N/A 08/24/2017   Procedure: COLONOSCOPY WITH PROPOFOL;  Surgeon: Manya Silvas, MD;  Location: [facility]COPY;  Service: Endoscopy;  Laterality: N/A;  . CORONARY ANGIOPLASTY     one stent placed  . EYE SURGERY     cataract surgery  . LUMBAR LAMINECTOMY/ DECOMPRESSION WITH MET-RX N/A 10/30/2018   Procedure: L4-5 LUMBAR LAMINECTOMY;  Surgeon: Deetta Perla, MD;  Location: [facility] ORS;  Service: Neurosurgery;  Laterality: N/A;  . LUMBAR LAMINECTOMY/DECOMPRESSION MICRODISCECTOMY Bilateral 10/23/2012   Procedure: LUMBAR LAMINECTOMY/DECOMPRESSION MICRODISCECTOMY 1 LEVEL;  Surgeon: Otilio Connors, MD;  Location: [place] NEURO ORS;  Service: Neurosurgery;  Laterality: Bilateral;  LUMBAR LAMINECTOMY/DECOMPRESSION MICRODISCECTOMY 1 LEVEL  . PENILE PROSTHESIS IMPLANT  1994   x3  . right shoulder arthroscopic    . SHOULDER SURGERY    . SUPRAPUBIC CATHETER PLACEMENT     and removal  . URETHERAL RE-IMPLANTATION         Home Medications    Prior to Admission medications   Medication Sig Start Date End Date Taking? Authorizing Provider  aspirin EC 81 MG tablet Take 81 mg by mouth daily.   Yes [provider]  glimepiride (AMARYL) 2 MG tablet Take by mouth. 11/27/18 11/27/19 Yes [provider]  metFORMIN (GLUCOPHAGE) 500 MG tablet Take 1,000 mg by mouth 2 (two) times daily with a meal.   Yes  [provider]  metoprolol succinate (TOPROL-XL) 100 MG 24 hr tablet Take 100 mg by mouth daily. Take with or immediately following a meal.   Yes [provider]  nitroGLYCERIN (NITROSTAT) 0.4 MG SL tablet Place 0.4 mg under the tongue every 5 (five) minutes as needed for chest pain.   Yes [provider]  Omega-3 Fatty Acids (FISH OIL PO) Take 2,000 mg by mouth 2 (two) times daily.   Yes [provider]  acetaminophen (TYLENOL) 650 MG CR tablet Take by mouth.    [provider]  Investigational - Study Medication  Take 14 mg by mouth daily before breakfast. Study name: semaglutide 14 mg or placebo    [provider]    Family History Family History  Problem Relation Age of Onset  . CAD Mother   . Heart attack Father     Social History Social History   Tobacco Use  . Smoking status: Current Every Day Smoker    Packs/day: 0.00    Years: 0.00    Pack years: 0.00  . Smokeless tobacco: Current User    Types: Chew  Substance Use Topics  . Alcohol use: Yes    Alcohol/week: 5.0 standard drinks    Types: 5 Cans of beer per week    Comment: not every day but few times per week  . Drug use: No     Allergies   Statins and Celebrex [celecoxib]   Review of Systems Review of Systems  Constitutional: Positive for fever.  Respiratory: Positive for cough.   Musculoskeletal:       Body aches.   Physical Exam Triage Vital Signs ED Triage Vitals  Enc Vitals Group     BP 12/31/18 0845 (!) 176/88     Pulse Rate 12/31/18 0845 82     Resp 12/31/18 0845 18     Temp 12/31/18 0845 98.6 F (37 C)     Temp Source 12/31/18 0845 Oral     SpO2 12/31/18 0845 98 %     Weight 12/31/18 0840 212 lb (96.2 kg)     Height 12/31/18 0840 _0  (1.727 m)     Head Circumference --      Peak Flow --      Pain Score 12/31/18 0840 0     Pain Loc --      Pain Edu? --      Excl. in [place]? --    Updated Vital Signs BP (!) 176/88 (BP Location: Left Arm)   Pulse 82   Temp 98.6 F (37 C) (Oral)   Resp 18   Ht _1  (1.727 m)   Wt 96.2 kg   SpO2 98%   BMI 32.23 kg/m   Visual Acuity Right Eye Distance:   Left Eye Distance:   Bilateral Distance:    Right Eye Near:   Left Eye Near:    Bilateral Near:     Physical Exam Vitals and nursing note reviewed.  Constitutional:      General: He is not in acute distress.    Appearance: Normal appearance. He is not ill-appearing.  HENT:     Head: Normocephalic and atraumatic.     Mouth/Throat:     Pharynx: Oropharynx is clear. No oropharyngeal  exudate.  Eyes:     General:        Right eye: No discharge.        Left eye: No discharge.     Conjunctiva/sclera: Conjunctivae normal.  Cardiovascular:  Rate and Rhythm: Normal rate and regular rhythm.  Pulmonary:     Effort: Pulmonary effort is normal.     Breath sounds: Normal breath sounds. No wheezing, rhonchi or rales.  Skin:    General: Skin is warm.     Findings: No rash.  Neurological:     Mental Status: He is alert.  Psychiatric:        Mood and Affect: Mood normal.        Behavior: Behavior normal.    UC Treatments / Results  Labs (all labs ordered are listed, but only abnormal results are displayed) Labs Reviewed  NOVEL CORONAVIRUS, NAA (HOSP ORDER, SEND-OUT TO REF LAB; TAT 18-24 HRS)    EKG   Radiology No results found.  Procedures Procedures (including critical care time)  Medications Ordered in UC Medications - No data to display  Initial Impression / Assessment and Plan / UC Course  I have reviewed the triage vital signs and the nursing notes.  Pertinent labs & imaging results that were available during my care of the patient were reviewed by me and considered in my medical decision making (see chart for details).    73 year old male presents with cough, body aches, and reported fever.  He is currently afebrile.  Well-appearing with a benign exam.  Covid test done today.  Awaiting results.  Advised over-the-counter Tylenol as needed.  Advised to stay at home.  Supportive care.  Final Clinical Impressions(s) / UC Diagnoses   Final diagnoses:  Cough  Body aches  Encounter for screening laboratory testing for COVID-19 virus     Discharge Instructions     Tylenol as needed.  Test results available in 24-48 hours.  Stay home.  Take care  Dr. Lacinda Axon    ED Prescriptions    None     PDMP not reviewed this encounter.   Coral Spikes, [place] 12/31/18 [phone]

## 2018-12-31 NOTE — ED Triage Notes (Signed)
Pt c/o "fever" (99.2), body aches, and cough. Started about 2 days ago. He states that he was possibly exposed to covid.

## 2019-01-01 LAB — NOVEL CORONAVIRUS, NAA (HOSP ORDER, SEND-OUT TO REF LAB; TAT 18-24 HRS): SARS-CoV-2, NAA: DETECTED — AB

## 2019-01-02 ENCOUNTER — Encounter (HOSPITAL_COMMUNITY): Payer: Self-pay

## 2019-01-02 ENCOUNTER — Telehealth (HOSPITAL_COMMUNITY): Payer: Self-pay | Admitting: Emergency Medicine

## 2019-01-02 NOTE — Telephone Encounter (Signed)

## 2019-01-02 NOTE — Telephone Encounter (Signed)
Patient contacted by phone and made aware of   positive covid results. Pt verbalized understanding and had all questions answered.

## 2019-01-25 DIAGNOSIS — I10 Essential (primary) hypertension: Secondary | ICD-10-CM | POA: Diagnosis not present

## 2019-01-25 DIAGNOSIS — E785 Hyperlipidemia, unspecified: Secondary | ICD-10-CM | POA: Diagnosis not present

## 2019-01-25 DIAGNOSIS — G720 Drug-induced myopathy: Secondary | ICD-10-CM | POA: Diagnosis not present

## 2019-01-25 DIAGNOSIS — R0600 Dyspnea, unspecified: Secondary | ICD-10-CM | POA: Diagnosis not present

## 2019-01-25 DIAGNOSIS — Z8616 Personal history of COVID-19: Secondary | ICD-10-CM | POA: Diagnosis not present

## 2019-01-25 DIAGNOSIS — T466X5A Adverse effect of antihyperlipidemic and antiarteriosclerotic drugs, initial encounter: Secondary | ICD-10-CM | POA: Diagnosis not present

## 2019-01-25 DIAGNOSIS — I2510 Atherosclerotic heart disease of native coronary artery without angina pectoris: Secondary | ICD-10-CM | POA: Diagnosis not present

## 2019-03-19 DIAGNOSIS — E1121 Type 2 diabetes mellitus with diabetic nephropathy: Secondary | ICD-10-CM | POA: Diagnosis not present

## 2019-03-19 DIAGNOSIS — I2510 Atherosclerotic heart disease of native coronary artery without angina pectoris: Secondary | ICD-10-CM | POA: Diagnosis not present

## 2019-03-19 DIAGNOSIS — I10 Essential (primary) hypertension: Secondary | ICD-10-CM | POA: Diagnosis not present

## 2019-03-26 DIAGNOSIS — I2510 Atherosclerotic heart disease of native coronary artery without angina pectoris: Secondary | ICD-10-CM | POA: Diagnosis not present

## 2019-03-26 DIAGNOSIS — L989 Disorder of the skin and subcutaneous tissue, unspecified: Secondary | ICD-10-CM | POA: Diagnosis not present

## 2019-03-26 DIAGNOSIS — E1121 Type 2 diabetes mellitus with diabetic nephropathy: Secondary | ICD-10-CM | POA: Diagnosis not present

## 2019-03-26 DIAGNOSIS — T466X5A Adverse effect of antihyperlipidemic and antiarteriosclerotic drugs, initial encounter: Secondary | ICD-10-CM | POA: Diagnosis not present

## 2019-03-26 DIAGNOSIS — G720 Drug-induced myopathy: Secondary | ICD-10-CM | POA: Diagnosis not present

## 2019-03-29 DIAGNOSIS — C4442 Squamous cell carcinoma of skin of scalp and neck: Secondary | ICD-10-CM | POA: Diagnosis not present

## 2019-03-29 DIAGNOSIS — L570 Actinic keratosis: Secondary | ICD-10-CM | POA: Diagnosis not present

## 2019-03-29 DIAGNOSIS — X32XXXA Exposure to sunlight, initial encounter: Secondary | ICD-10-CM | POA: Diagnosis not present

## 2019-03-29 DIAGNOSIS — D485 Neoplasm of uncertain behavior of skin: Secondary | ICD-10-CM | POA: Diagnosis not present

## 2019-05-03 DIAGNOSIS — C4442 Squamous cell carcinoma of skin of scalp and neck: Secondary | ICD-10-CM | POA: Diagnosis not present

## 2019-06-04 DIAGNOSIS — E1121 Type 2 diabetes mellitus with diabetic nephropathy: Secondary | ICD-10-CM | POA: Diagnosis not present

## 2019-06-04 DIAGNOSIS — I252 Old myocardial infarction: Secondary | ICD-10-CM | POA: Diagnosis not present

## 2019-06-04 DIAGNOSIS — I2510 Atherosclerotic heart disease of native coronary artery without angina pectoris: Secondary | ICD-10-CM | POA: Diagnosis not present

## 2019-06-04 DIAGNOSIS — G720 Drug-induced myopathy: Secondary | ICD-10-CM | POA: Diagnosis not present

## 2019-06-04 DIAGNOSIS — T466X5A Adverse effect of antihyperlipidemic and antiarteriosclerotic drugs, initial encounter: Secondary | ICD-10-CM | POA: Diagnosis not present

## 2019-06-04 DIAGNOSIS — I10 Essential (primary) hypertension: Secondary | ICD-10-CM | POA: Diagnosis not present

## 2019-06-04 DIAGNOSIS — E785 Hyperlipidemia, unspecified: Secondary | ICD-10-CM | POA: Diagnosis not present

## 2019-06-04 DIAGNOSIS — R079 Chest pain, unspecified: Secondary | ICD-10-CM | POA: Diagnosis not present

## 2019-06-04 DIAGNOSIS — R0600 Dyspnea, unspecified: Secondary | ICD-10-CM | POA: Diagnosis not present

## 2019-07-24 DIAGNOSIS — I2510 Atherosclerotic heart disease of native coronary artery without angina pectoris: Secondary | ICD-10-CM | POA: Diagnosis not present

## 2019-07-24 DIAGNOSIS — E1121 Type 2 diabetes mellitus with diabetic nephropathy: Secondary | ICD-10-CM | POA: Diagnosis not present

## 2019-07-30 DIAGNOSIS — G720 Drug-induced myopathy: Secondary | ICD-10-CM | POA: Diagnosis not present

## 2019-07-30 DIAGNOSIS — T466X5A Adverse effect of antihyperlipidemic and antiarteriosclerotic drugs, initial encounter: Secondary | ICD-10-CM | POA: Diagnosis not present

## 2019-07-30 DIAGNOSIS — E785 Hyperlipidemia, unspecified: Secondary | ICD-10-CM | POA: Diagnosis not present

## 2019-07-30 DIAGNOSIS — I10 Essential (primary) hypertension: Secondary | ICD-10-CM | POA: Diagnosis not present

## 2019-07-30 DIAGNOSIS — I2510 Atherosclerotic heart disease of native coronary artery without angina pectoris: Secondary | ICD-10-CM | POA: Diagnosis not present

## 2019-07-30 DIAGNOSIS — E1121 Type 2 diabetes mellitus with diabetic nephropathy: Secondary | ICD-10-CM | POA: Diagnosis not present

## 2019-10-09 DIAGNOSIS — I2510 Atherosclerotic heart disease of native coronary artery without angina pectoris: Secondary | ICD-10-CM | POA: Diagnosis not present

## 2019-10-09 DIAGNOSIS — E785 Hyperlipidemia, unspecified: Secondary | ICD-10-CM | POA: Diagnosis not present

## 2019-10-09 DIAGNOSIS — R0600 Dyspnea, unspecified: Secondary | ICD-10-CM | POA: Diagnosis not present

## 2019-10-09 DIAGNOSIS — Z23 Encounter for immunization: Secondary | ICD-10-CM | POA: Diagnosis not present

## 2019-10-09 DIAGNOSIS — I10 Essential (primary) hypertension: Secondary | ICD-10-CM | POA: Diagnosis not present

## 2019-11-14 DIAGNOSIS — L570 Actinic keratosis: Secondary | ICD-10-CM | POA: Diagnosis not present

## 2019-11-14 DIAGNOSIS — D485 Neoplasm of uncertain behavior of skin: Secondary | ICD-10-CM | POA: Diagnosis not present

## 2019-11-14 DIAGNOSIS — L821 Other seborrheic keratosis: Secondary | ICD-10-CM | POA: Diagnosis not present

## 2019-11-14 DIAGNOSIS — D2261 Melanocytic nevi of right upper limb, including shoulder: Secondary | ICD-10-CM | POA: Diagnosis not present

## 2019-11-14 DIAGNOSIS — D225 Melanocytic nevi of trunk: Secondary | ICD-10-CM | POA: Diagnosis not present

## 2019-11-14 DIAGNOSIS — D2262 Melanocytic nevi of left upper limb, including shoulder: Secondary | ICD-10-CM | POA: Diagnosis not present

## 2019-11-14 DIAGNOSIS — Z85828 Personal history of other malignant neoplasm of skin: Secondary | ICD-10-CM | POA: Diagnosis not present

## 2019-11-14 DIAGNOSIS — D2271 Melanocytic nevi of right lower limb, including hip: Secondary | ICD-10-CM | POA: Diagnosis not present

## 2019-11-28 DIAGNOSIS — E1121 Type 2 diabetes mellitus with diabetic nephropathy: Secondary | ICD-10-CM | POA: Diagnosis not present

## 2019-11-28 DIAGNOSIS — I2510 Atherosclerotic heart disease of native coronary artery without angina pectoris: Secondary | ICD-10-CM | POA: Diagnosis not present

## 2019-11-28 DIAGNOSIS — E785 Hyperlipidemia, unspecified: Secondary | ICD-10-CM | POA: Diagnosis not present

## 2019-12-05 DIAGNOSIS — T466X5A Adverse effect of antihyperlipidemic and antiarteriosclerotic drugs, initial encounter: Secondary | ICD-10-CM | POA: Diagnosis not present

## 2019-12-05 DIAGNOSIS — E1121 Type 2 diabetes mellitus with diabetic nephropathy: Secondary | ICD-10-CM | POA: Diagnosis not present

## 2019-12-05 DIAGNOSIS — Z0000 Encounter for general adult medical examination without abnormal findings: Secondary | ICD-10-CM | POA: Diagnosis not present

## 2019-12-05 DIAGNOSIS — G720 Drug-induced myopathy: Secondary | ICD-10-CM | POA: Diagnosis not present

## 2019-12-05 DIAGNOSIS — I10 Essential (primary) hypertension: Secondary | ICD-10-CM | POA: Diagnosis not present

## 2019-12-05 DIAGNOSIS — I2510 Atherosclerotic heart disease of native coronary artery without angina pectoris: Secondary | ICD-10-CM | POA: Diagnosis not present

## 2019-12-28 DIAGNOSIS — R0789 Other chest pain: Secondary | ICD-10-CM | POA: Diagnosis not present

## 2019-12-28 DIAGNOSIS — R0600 Dyspnea, unspecified: Secondary | ICD-10-CM | POA: Diagnosis not present

## 2020-01-01 DIAGNOSIS — E1121 Type 2 diabetes mellitus with diabetic nephropathy: Secondary | ICD-10-CM | POA: Diagnosis not present

## 2020-01-01 DIAGNOSIS — E785 Hyperlipidemia, unspecified: Secondary | ICD-10-CM | POA: Diagnosis not present

## 2020-01-01 DIAGNOSIS — I10 Essential (primary) hypertension: Secondary | ICD-10-CM | POA: Diagnosis not present

## 2020-01-01 DIAGNOSIS — I2510 Atherosclerotic heart disease of native coronary artery without angina pectoris: Secondary | ICD-10-CM | POA: Diagnosis not present

## 2020-01-01 DIAGNOSIS — I252 Old myocardial infarction: Secondary | ICD-10-CM | POA: Diagnosis not present

## 2020-02-12 DIAGNOSIS — I10 Essential (primary) hypertension: Secondary | ICD-10-CM | POA: Diagnosis not present

## 2020-02-12 DIAGNOSIS — R079 Chest pain, unspecified: Secondary | ICD-10-CM | POA: Diagnosis not present

## 2020-02-12 DIAGNOSIS — I252 Old myocardial infarction: Secondary | ICD-10-CM | POA: Diagnosis not present

## 2020-02-12 DIAGNOSIS — I2510 Atherosclerotic heart disease of native coronary artery without angina pectoris: Secondary | ICD-10-CM | POA: Diagnosis not present

## 2020-02-12 DIAGNOSIS — G720 Drug-induced myopathy: Secondary | ICD-10-CM | POA: Diagnosis not present

## 2020-02-12 DIAGNOSIS — E785 Hyperlipidemia, unspecified: Secondary | ICD-10-CM | POA: Diagnosis not present

## 2020-02-12 DIAGNOSIS — T466X5A Adverse effect of antihyperlipidemic and antiarteriosclerotic drugs, initial encounter: Secondary | ICD-10-CM | POA: Diagnosis not present

## 2020-02-12 DIAGNOSIS — R9439 Abnormal result of other cardiovascular function study: Secondary | ICD-10-CM | POA: Diagnosis not present

## 2020-02-12 DIAGNOSIS — R0600 Dyspnea, unspecified: Secondary | ICD-10-CM | POA: Diagnosis not present

## 2020-02-12 DIAGNOSIS — E1121 Type 2 diabetes mellitus with diabetic nephropathy: Secondary | ICD-10-CM | POA: Diagnosis not present

## 2020-02-25 DIAGNOSIS — H2512 Age-related nuclear cataract, left eye: Secondary | ICD-10-CM | POA: Diagnosis not present

## 2020-03-11 DIAGNOSIS — R519 Headache, unspecified: Secondary | ICD-10-CM | POA: Diagnosis not present

## 2020-03-31 DIAGNOSIS — M25511 Pain in right shoulder: Secondary | ICD-10-CM | POA: Diagnosis not present

## 2020-03-31 DIAGNOSIS — E1121 Type 2 diabetes mellitus with diabetic nephropathy: Secondary | ICD-10-CM | POA: Diagnosis not present

## 2020-03-31 DIAGNOSIS — I2510 Atherosclerotic heart disease of native coronary artery without angina pectoris: Secondary | ICD-10-CM | POA: Diagnosis not present

## 2020-03-31 DIAGNOSIS — G8929 Other chronic pain: Secondary | ICD-10-CM | POA: Diagnosis not present

## 2020-03-31 DIAGNOSIS — M8949 Other hypertrophic osteoarthropathy, multiple sites: Secondary | ICD-10-CM | POA: Diagnosis not present

## 2020-03-31 DIAGNOSIS — I10 Essential (primary) hypertension: Secondary | ICD-10-CM | POA: Diagnosis not present

## 2020-03-31 DIAGNOSIS — M25512 Pain in left shoulder: Secondary | ICD-10-CM | POA: Diagnosis not present

## 2020-04-01 ENCOUNTER — Emergency Department: Payer: PPO

## 2020-04-01 ENCOUNTER — Emergency Department
Admission: EM | Admit: 2020-04-01 | Discharge: 2020-04-01 | Disposition: A | Discharge status: Home / Self Care | Payer: PPO | Attending: Emergency Medicine | Admitting: Emergency Medicine

## 2020-04-01 ENCOUNTER — Other Ambulatory Visit: Payer: Self-pay

## 2020-04-01 DIAGNOSIS — I2510 Atherosclerotic heart disease of native coronary artery without angina pectoris: Secondary | ICD-10-CM | POA: Insufficient documentation

## 2020-04-01 DIAGNOSIS — Z79899 Other long term (current) drug therapy: Secondary | ICD-10-CM | POA: Diagnosis not present

## 2020-04-01 DIAGNOSIS — I6789 Other cerebrovascular disease: Secondary | ICD-10-CM | POA: Diagnosis not present

## 2020-04-01 DIAGNOSIS — Z7982 Long term (current) use of aspirin: Secondary | ICD-10-CM | POA: Diagnosis not present

## 2020-04-01 DIAGNOSIS — I10 Essential (primary) hypertension: Secondary | ICD-10-CM | POA: Diagnosis not present

## 2020-04-01 DIAGNOSIS — G9389 Other specified disorders of brain: Secondary | ICD-10-CM | POA: Diagnosis not present

## 2020-04-01 DIAGNOSIS — I252 Old myocardial infarction: Secondary | ICD-10-CM | POA: Diagnosis not present

## 2020-04-01 DIAGNOSIS — Z7984 Long term (current) use of oral hypoglycemic drugs: Secondary | ICD-10-CM | POA: Diagnosis not present

## 2020-04-01 DIAGNOSIS — E119 Type 2 diabetes mellitus without complications: Secondary | ICD-10-CM | POA: Insufficient documentation

## 2020-04-01 DIAGNOSIS — K219 Gastro-esophageal reflux disease without esophagitis: Secondary | ICD-10-CM | POA: Diagnosis not present

## 2020-04-01 DIAGNOSIS — S0990XA Unspecified injury of head, initial encounter: Secondary | ICD-10-CM | POA: Diagnosis not present

## 2020-04-01 DIAGNOSIS — R109 Unspecified abdominal pain: Secondary | ICD-10-CM | POA: Insufficient documentation

## 2020-04-01 DIAGNOSIS — I69398 Other sequelae of cerebral infarction: Secondary | ICD-10-CM | POA: Diagnosis not present

## 2020-04-01 DIAGNOSIS — Y92410 Unspecified street and highway as the place of occurrence of the external cause: Secondary | ICD-10-CM | POA: Insufficient documentation

## 2020-04-01 DIAGNOSIS — G319 Degenerative disease of nervous system, unspecified: Secondary | ICD-10-CM | POA: Diagnosis not present

## 2020-04-01 DIAGNOSIS — S3991XA Unspecified injury of abdomen, initial encounter: Secondary | ICD-10-CM | POA: Diagnosis not present

## 2020-04-01 DIAGNOSIS — M7918 Myalgia, other site: Secondary | ICD-10-CM

## 2020-04-01 DIAGNOSIS — F17220 Nicotine dependence, chewing tobacco, uncomplicated: Secondary | ICD-10-CM | POA: Insufficient documentation

## 2020-04-01 DIAGNOSIS — R0789 Other chest pain: Secondary | ICD-10-CM | POA: Diagnosis not present

## 2020-04-01 DIAGNOSIS — Z951 Presence of aortocoronary bypass graft: Secondary | ICD-10-CM | POA: Diagnosis not present

## 2020-04-01 DIAGNOSIS — I739 Peripheral vascular disease, unspecified: Secondary | ICD-10-CM | POA: Diagnosis not present

## 2020-04-01 DIAGNOSIS — R079 Chest pain, unspecified: Secondary | ICD-10-CM | POA: Diagnosis not present

## 2020-04-01 DIAGNOSIS — S299XXA Unspecified injury of thorax, initial encounter: Secondary | ICD-10-CM | POA: Diagnosis not present

## 2020-04-01 LAB — TROPONIN I (HIGH SENSITIVITY): Troponin I (High Sensitivity): 8 ng/L (ref <18)

## 2020-04-01 LAB — I-STAT CREATININE, ED: Creatinine, Ser: 1.1 mg/dL (ref 0.61–1.24)

## 2020-04-01 MED ORDER — IOHEXOL 300 MG/ML  SOLN
100.0000 mL | Freq: Once | INTRAMUSCULAR | Status: AC | PRN
  Administered 2020-04-01: 100 mL via INTRAVENOUS
  Filled 2020-04-01: qty 100

## 2020-04-01 MED ORDER — CYCLOBENZAPRINE HCL 5 MG PO TABS: 5.0000 mg | ORAL_TABLET | Freq: Every day | ORAL | 0 refills | Status: DC
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
## 2020-04-01 NOTE — Discharge Instructions (Addendum)
No acute findings on CT of the head, chest, abdomen, or pelvic.  Follow discharge care instruction take medication as directed.
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
## 2020-04-07 DIAGNOSIS — I2510 Atherosclerotic heart disease of native coronary artery without angina pectoris: Secondary | ICD-10-CM | POA: Diagnosis not present

## 2020-04-07 DIAGNOSIS — Z8673 Personal history of transient ischemic attack (TIA), and cerebral infarction without residual deficits: Secondary | ICD-10-CM | POA: Insufficient documentation

## 2020-04-07 DIAGNOSIS — G720 Drug-induced myopathy: Secondary | ICD-10-CM | POA: Diagnosis not present

## 2020-04-07 DIAGNOSIS — R519 Headache, unspecified: Secondary | ICD-10-CM | POA: Diagnosis not present

## 2020-04-07 DIAGNOSIS — E1121 Type 2 diabetes mellitus with diabetic nephropathy: Secondary | ICD-10-CM | POA: Diagnosis not present

## 2020-04-07 DIAGNOSIS — I10 Essential (primary) hypertension: Secondary | ICD-10-CM | POA: Diagnosis not present

## 2020-04-07 DIAGNOSIS — T466X5A Adverse effect of antihyperlipidemic and antiarteriosclerotic drugs, initial encounter: Secondary | ICD-10-CM | POA: Diagnosis not present

## 2020-04-09 DIAGNOSIS — H2512 Age-related nuclear cataract, left eye: Secondary | ICD-10-CM | POA: Diagnosis not present

## 2020-04-15 ENCOUNTER — Other Ambulatory Visit: Payer: Self-pay

## 2020-04-15 ENCOUNTER — Encounter: Payer: Self-pay | Admitting: Ophthalmology

## 2020-04-21 ENCOUNTER — Other Ambulatory Visit
Admission: RE | Admit: 2020-04-21 | Discharge: 2020-04-21 | Disposition: A | Discharge status: Home / Self Care | Payer: PPO | Source: Ambulatory Visit | Attending: Ophthalmology | Admitting: Ophthalmology

## 2020-04-21 ENCOUNTER — Other Ambulatory Visit: Payer: Self-pay

## 2020-04-21 DIAGNOSIS — Z01812 Encounter for preprocedural laboratory examination: Secondary | ICD-10-CM | POA: Insufficient documentation

## 2020-04-21 DIAGNOSIS — Z20822 Contact with and (suspected) exposure to covid-19: Secondary | ICD-10-CM | POA: Diagnosis not present

## 2020-04-21 LAB — SARS CORONAVIRUS 2 (TAT 6-24 HRS): SARS Coronavirus 2: NEGATIVE

## 2020-04-21 NOTE — Discharge Instructions (Signed)
General Anesthesia, Adult, Care After °This sheet gives you information about how to care for yourself after your procedure. Your health care provider may also give you more specific instructions. If you have problems or questions, contact your health care provider. °What can I expect after the procedure? °After the procedure, the following side effects are common: °· Pain or discomfort at the IV site. °· Nausea. °· Vomiting. °· Sore throat. °· Trouble concentrating. °· Feeling cold or chills. °· Feeling weak or tired. °· Sleepiness and fatigue. °· Soreness and body aches. These side effects can affect parts of the body that were not involved in surgery. °Follow these instructions at home: °For the time period you were told by your health care provider: °· Rest. °· Do not participate in activities where you could fall or become injured. °· Do not drive or use machinery. °· Do not drink alcohol. °· Do not take sleeping pills or medicines that cause drowsiness. °· Do not make important decisions or sign legal documents. °· Do not take care of children on your own.   °Eating and drinking °· Follow any instructions from your health care provider about eating or drinking restrictions. °· When you feel hungry, start by eating small amounts of foods that are soft and easy to digest (bland), such as toast. Gradually return to your regular diet. °· Drink enough fluid to keep your urine pale yellow. °· If you vomit, rehydrate by drinking water, juice, or clear broth. °General instructions °· If you have sleep apnea, surgery and certain medicines can increase your risk for breathing problems. Follow instructions from your health care provider about wearing your sleep device: °? Anytime you are sleeping, including during daytime naps. °? While taking prescription pain medicines, sleeping medicines, or medicines that make you drowsy. °· Have a responsible adult stay with you for the time you are told. It is important to have  someone help care for you until you are awake and alert. °· Return to your normal activities as told by your health care provider. Ask your health care provider what activities are safe for you. °· Take over-the-counter and prescription medicines only as told by your health care provider. °· If you smoke, do not smoke without supervision. °· Keep all follow-up visits as told by your health care provider. This is important. °Contact a health care provider if: °· You have nausea or vomiting that does not get better with medicine. °· You cannot eat or drink without vomiting. °· You have pain that does not get better with medicine. °· You are unable to pass urine. °· You develop a skin rash. °· You have a fever. °· You have redness around your IV site that gets worse. °Get help right away if: °· You have difficulty breathing. °· You have chest pain. °· You have blood in your urine or stool, or you vomit blood. °Summary °· After the procedure, it is common to have a sore throat or nausea. It is also common to feel tired. °· Have a responsible adult stay with you for the time you are told. It is important to have someone help care for you until you are awake and alert. °· When you feel hungry, start by eating small amounts of foods that are soft and easy to digest (bland), such as toast. Gradually return to your regular diet. °· Drink enough fluid to keep your urine pale yellow. °· Return to your normal activities as told by your health care provider.   Ask your health care provider what activities are safe for you. °This information is not intended to replace advice given to you by your health care provider. Make sure you discuss any questions you have with your health care provider. °Document Revised: 09/20/2019 Document Reviewed: 04/19/2019 °Elsevier Patient Education © 2021 [employer] ° °Cataract Surgery, Care After °This sheet gives you information about how to care for yourself after your procedure. Your health  care provider may also give you more specific instructions. If you have problems or questions, contact your health care provider. °What can I expect after the procedure? °After the procedure, it is common to have: °· Itching. °· Discomfort. °· Fluid discharge. °· Sensitivity to light and to touch. °· Bruising in or around the eye. °· Mild blurred vision. °Follow these instructions at home: °Eye care °· Do not touch or rub your eyes. °· Protect your eyes as told by your health care provider. You may be told to wear a protective eye shield or sunglasses. °· Do not put a contact lens into the affected eye or eyes until your health care provider approves. °· Keep the area around your eye clean and dry: °? Avoid swimming. °? Do not allow water to hit you directly in the face while showering. °? Keep soap and shampoo out of your eyes. °· Check your eye every day for signs of infection. Watch for: °? Redness, swelling, or pain. °? Fluid, blood, or pus. °? Warmth. °? A bad smell. °? Vision that is getting worse. °? Sensitivity that is getting worse.   °Activity °· Do not drive for 24 hours if you were given a sedative during your procedure. °· Avoid strenuous activities, such as playing contact sports, for as long as told by your health care provider. °· Do not drive or use heavy machinery until your health care provider approves. °· Do not bend or lift heavy objects. Bending increases pressure in the eye. You can walk, climb stairs, and do light household chores. °· Ask your health care provider when you can return to work. If you work in a dusty environment, you may be advised to wear protective eyewear for a period of time. °General instructions °· Take or apply over-the-counter and prescription medicines only as told by your health care provider. This includes eye drops. °· Keep all follow-up visits as told by your health care provider. This is important. °Contact a health care provider if: °· You have increased  bruising around your eye. °· You have pain that is not helped with medicine. °· You have a fever. °· You have redness, swelling, or pain in your eye. °· You have fluid, blood, or pus coming from your incision. °· Your vision gets worse. °· Your sensitivity to light gets worse. °Get help right away if: °· You have sudden loss of vision. °· You see flashes of light or spots (floaters). °· You have severe eye pain. °· You develop nausea or vomiting. °Summary °· After your procedure, it is common to have itching, discomfort, bruising, fluid discharge, or sensitivity to light. °· Follow instructions from your health care provider about caring for your eye after the procedure. °· Do not rub your eye after the procedure. You may need to wear eye protection or sunglasses. Do not wear contact lenses. Keep the area around your eye clean and dry. °· Avoid activities that require a lot of effort. These include playing sports and lifting heavy objects. °· Contact a health care provider if   you have increased bruising, pain that does not go away, or a fever. Get help right away if you suddenly lose your vision, see flashes of light or spots, or have severe pain in the eye. °This information is not intended to replace advice given to you by your health care provider. Make sure you discuss any questions you have with your health care provider. °Document Revised: 10/31/2018 Document Reviewed: 07/04/2017 °Elsevier Patient Education © 2021 Elsevier Inc. ° °

## 2020-04-22 DIAGNOSIS — T1512XA Foreign body in conjunctival sac, left eye, initial encounter: Secondary | ICD-10-CM | POA: Diagnosis not present

## 2020-04-23 ENCOUNTER — Ambulatory Visit
Admission: RE | Admit: 2020-04-23 | Discharge: 2020-04-23 | Disposition: A | Discharge status: Home / Self Care | Payer: PPO | Attending: Ophthalmology | Admitting: Ophthalmology

## 2020-04-23 ENCOUNTER — Encounter: Payer: Self-pay | Admitting: Ophthalmology

## 2020-04-23 ENCOUNTER — Ambulatory Visit: Payer: PPO | Admitting: Anesthesiology

## 2020-04-23 ENCOUNTER — Other Ambulatory Visit: Payer: Self-pay

## 2020-04-23 ENCOUNTER — Encounter
Admission: RE | Disposition: A | Discharge status: Home / Self Care | Payer: Self-pay | Source: Home / Self Care | Attending: Ophthalmology

## 2020-04-23 DIAGNOSIS — Z87891 Personal history of nicotine dependence: Secondary | ICD-10-CM | POA: Diagnosis not present

## 2020-04-23 DIAGNOSIS — Z8616 Personal history of COVID-19: Secondary | ICD-10-CM | POA: Insufficient documentation

## 2020-04-23 DIAGNOSIS — Z886 Allergy status to analgesic agent status: Secondary | ICD-10-CM | POA: Diagnosis not present

## 2020-04-23 DIAGNOSIS — Z7984 Long term (current) use of oral hypoglycemic drugs: Secondary | ICD-10-CM | POA: Insufficient documentation

## 2020-04-23 DIAGNOSIS — H2512 Age-related nuclear cataract, left eye: Secondary | ICD-10-CM | POA: Diagnosis not present

## 2020-04-23 DIAGNOSIS — Z7982 Long term (current) use of aspirin: Secondary | ICD-10-CM | POA: Diagnosis not present

## 2020-04-23 DIAGNOSIS — Z79899 Other long term (current) drug therapy: Secondary | ICD-10-CM | POA: Insufficient documentation

## 2020-04-23 DIAGNOSIS — Z888 Allergy status to other drugs, medicaments and biological substances status: Secondary | ICD-10-CM | POA: Insufficient documentation

## 2020-04-23 DIAGNOSIS — E1136 Type 2 diabetes mellitus with diabetic cataract: Secondary | ICD-10-CM | POA: Insufficient documentation

## 2020-04-23 DIAGNOSIS — H25812 Combined forms of age-related cataract, left eye: Secondary | ICD-10-CM | POA: Diagnosis not present

## 2020-04-23 HISTORY — PX: CATARACT EXTRACTION W/PHACO: SHX586

## 2020-04-23 LAB — GLUCOSE, CAPILLARY
Glucose-Capillary: 112 mg/dL — ABNORMAL HIGH (ref 70–99)
Glucose-Capillary: 147 mg/dL — ABNORMAL HIGH (ref 70–99)

## 2020-04-23 SURGERY — PHACOEMULSIFICATION, CATARACT, WITH IOL INSERTION
Anesthesia: Monitor Anesthesia Care | Site: Eye | Laterality: Left

## 2020-04-23 MED ORDER — BRIMONIDINE TARTRATE-TIMOLOL 0.2-0.5 % OP SOLN
OPHTHALMIC | Status: DC | PRN
  Administered 2020-04-23: 1 [drp] via OPHTHALMIC

## 2020-04-23 MED ORDER — FENTANYL CITRATE (PF) 100 MCG/2ML IJ SOLN
INTRAMUSCULAR | Status: DC | PRN
  Administered 2020-04-23: 50 ug via INTRAVENOUS

## 2020-04-23 MED ORDER — ONDANSETRON HCL 4 MG/2ML IJ SOLN: 4.0000 mg | Freq: Once | INTRAMUSCULAR | Status: DC | PRN

## 2020-04-23 MED ORDER — CEFUROXIME OPHTHALMIC INJECTION 1 MG/0.1 ML
INJECTION | OPHTHALMIC | Status: DC | PRN
  Administered 2020-04-23: 0.1 mL via INTRACAMERAL

## 2020-04-23 MED ORDER — TETRACAINE HCL 0.5 % OP SOLN
1.0000 [drp] | OPHTHALMIC | Status: DC | PRN
  Administered 2020-04-23 (×3): 1 [drp] via OPHTHALMIC

## 2020-04-23 MED ORDER — EPINEPHRINE PF 1 MG/ML IJ SOLN
INTRAOCULAR | Status: DC | PRN
  Administered 2020-04-23: 112 mL via OPHTHALMIC

## 2020-04-23 MED ORDER — ACETAMINOPHEN 160 MG/5ML PO SOLN: 325.0000 mg | ORAL | Status: DC | PRN

## 2020-04-23 MED ORDER — LIDOCAINE HCL (PF) 2 % IJ SOLN
INTRAOCULAR | Status: DC | PRN
  Administered 2020-04-23: 2 mL

## 2020-04-23 MED ORDER — MIDAZOLAM HCL 2 MG/2ML IJ SOLN
INTRAMUSCULAR | Status: DC | PRN
  Administered 2020-04-23: 1 mg via INTRAVENOUS

## 2020-04-23 MED ORDER — ACETAMINOPHEN 325 MG PO TABS: 325.0000 mg | ORAL_TABLET | ORAL | Status: DC | PRN

## 2020-04-23 MED ORDER — ARMC OPHTHALMIC DILATING DROPS
1.0000 "application " | OPHTHALMIC | Status: DC | PRN
  Administered 2020-04-23 (×3): 1 via OPHTHALMIC

## 2020-04-23 MED ORDER — LACTATED RINGERS IV SOLN: INTRAVENOUS | Status: DC

## 2020-04-23 MED ORDER — TETRACAINE HCL 0.5 % OP SOLN: 1.0000 [drp] | OPHTHALMIC | Status: DC | PRN

## 2020-04-23 MED ORDER — NA HYALUR & NA CHOND-NA HYALUR 0.4-0.35 ML IO KIT
PACK | INTRAOCULAR | Status: DC | PRN
  Administered 2020-04-23: 1 mL via INTRAOCULAR

## 2020-04-23 MED ORDER — ARMC OPHTHALMIC DILATING DROPS: 1.0000 "application " | OPHTHALMIC | Status: DC | PRN

## 2020-04-23 SURGICAL SUPPLY — 18 items
CANNULA ANT/CHMB 27GA (MISCELLANEOUS) ×2 IMPLANT
GLOVE EXAM LX STRL 7.5 (GLOVE) ×2 IMPLANT
GLOVE SURG TRIUMPH 8.0 PF LTX (GLOVE) ×2 IMPLANT
GOWN STRL REUS W/ TWL LRG LVL3 (GOWN DISPOSABLE) ×2 IMPLANT
GOWN STRL REUS W/TWL LRG LVL3 (GOWN DISPOSABLE) ×4
LENS IOL ACRYSOF IQ 22.5 (Intraocular Lens) ×2 IMPLANT
MARKER SKIN DUAL TIP RULER LAB (MISCELLANEOUS) ×2 IMPLANT
NEEDLE CAPSULORHEX 25GA (NEEDLE) ×2 IMPLANT
NEEDLE FILTER BLUNT 18X 1/2SAF (NEEDLE) ×2
NEEDLE FILTER BLUNT 18X1 1/2 (NEEDLE) ×2 IMPLANT
PACK CATARACT BRASINGTON (MISCELLANEOUS) ×2 IMPLANT
PACK EYE AFTER SURG (MISCELLANEOUS) ×2 IMPLANT
PACK OPTHALMIC (MISCELLANEOUS) ×2 IMPLANT
SOLUTION OPHTHALMIC SALT (MISCELLANEOUS) ×2 IMPLANT
SYR 3ML LL SCALE MARK (SYRINGE) ×4 IMPLANT
SYR TB 1ML LUER SLIP (SYRINGE) ×2 IMPLANT
WATER STERILE IRR 250ML POUR (IV SOLUTION) ×2 IMPLANT
WIPE NON LINTING 3.25X3.25 (MISCELLANEOUS) ×2 IMPLANT

## 2020-04-23 NOTE — Anesthesia Procedure Notes (Signed)
Procedure Name: MAC Date/Time: 04/23/2020 7:46 AM Performed by: Silvana Newness, CRNA Pre-anesthesia Checklist: Patient identified, Emergency Drugs available, Suction available, Patient being monitored and Timeout performed Patient Re-evaluated:Patient Re-evaluated prior to induction Oxygen Delivery Method: Nasal cannula Placement Confirmation: positive ETCO2

## 2020-04-23 NOTE — Anesthesia Postprocedure Evaluation (Signed)
Anesthesia Post Note  Patient: Joshua Ferrell.  Procedure(s) Performed: CATARACT EXTRACTION PHACO AND INTRAOCULAR LENS PLACEMENT (IOC)LEFT DIABETIC 14.14 02:15.2 10.5% (Left Eye)     Patient location during evaluation: PACU Anesthesia Type: MAC Level of consciousness: awake Pain management: pain level controlled Vital Signs Assessment: post-procedure vital signs reviewed and stable Respiratory status: respiratory function stable Cardiovascular status: stable Postop Assessment: no apparent nausea or vomiting Anesthetic complications: no   No complications documented.  Veda Canning

## 2020-04-23 NOTE — Anesthesia Preprocedure Evaluation (Signed)
Anesthesia Evaluation  Patient identified by MRN, date of birth, ID band Patient awake    Reviewed: Allergy & Precautions, NPO status   Airway Mallampati: II  TM Distance: >3 FB     Dental   Pulmonary former smoker,    Pulmonary exam normal        Cardiovascular hypertension, + CAD and + Past MI (2003)   Rhythm:Regular Rate:Normal     Neuro/Psych  Headaches,    GI/Hepatic GERD  ,  Endo/Other  diabetesObesity - BMI >30  Renal/GU      Musculoskeletal  (+) Arthritis ,   Abdominal   Peds  Hematology   Anesthesia Other Findings   Reproductive/Obstetrics                             Anesthesia Physical Anesthesia Plan  ASA: III  Anesthesia Plan: MAC   Post-op Pain Management:    Induction: Intravenous  PONV Risk Score and Plan: TIVA, Midazolam and Treatment may vary due to age or medical condition  Airway Management Planned: Natural Airway and Nasal Cannula  Additional Equipment:   Intra-op Plan:   Post-operative Plan:   Informed Consent: I have reviewed the patients History and Physical, chart, labs and discussed the procedure including the risks, benefits and alternatives for the proposed anesthesia with the patient or authorized representative who has indicated his/her understanding and acceptance.       Plan Discussed with: CRNA  Anesthesia Plan Comments:         Anesthesia Quick Evaluation

## 2020-04-23 NOTE — H&P (Signed)
[facility]   Primary Care Physician:  Kirk Ruths, MD Ophthalmologist: Dr. Leandrew Koyanagi  Pre-Procedure History & Physical: HPI:  Joshua Ferrell. is a 75 y.o. male here for ophthalmic surgery.   Past Medical History:  Diagnosis Date  . Arthritis   . Coronary artery disease   . COVID-19 12/2018  . Diabetes mellitus without complication ([place])   . Dyspnea    with exertion  . GERD (gastroesophageal reflux disease)    minimal episodes  . Headache(784.0)   . Heart disease   . History of blood transfusion 1990   internal injuries after a fall requiring blood  . History of colonic polyps   . Hyperglycemia   . Hyperlipidemia   . Hypertension   . Myocardial infarction ([place]) 2003  . PONV (postoperative nausea and vomiting)    one time only (surgery in 1990)    Past Surgical History:  Procedure Laterality Date  . ABDOMINAL SURGERY     patient unaware of this surgery  . BACK SURGERY    . CATARACT EXTRACTION W/PHACO Right 08/13/2015   Procedure: CATARACT EXTRACTION PHACO AND INTRAOCULAR LENS PLACEMENT (IOC);  Surgeon: Estill Cotta, MD;  Location: [facility] ORS;  Service: Ophthalmology;  Laterality: Right;  [place] 01:08 AP% 24.9 CDE 32.40 fluid pack lot # [id number]  . COLONOSCOPY    . COLONOSCOPY WITH PROPOFOL N/A 08/24/2017   Procedure: COLONOSCOPY WITH PROPOFOL;  Surgeon: Manya Silvas, MD;  Location: [facility] ENDOSCOPY;  Service: Endoscopy;  Laterality: N/A;  . CORONARY ANGIOPLASTY     one stent placed  . EYE SURGERY     cataract surgery  . LUMBAR LAMINECTOMY/ DECOMPRESSION WITH MET-RX N/A 10/30/2018   Procedure: L4-5 LUMBAR LAMINECTOMY;  Surgeon: Deetta Perla, MD;  Location: [facility] ORS;  Service: Neurosurgery;  Laterality: N/A;  . LUMBAR LAMINECTOMY/DECOMPRESSION MICRODISCECTOMY Bilateral 10/23/2012   Procedure: LUMBAR LAMINECTOMY/DECOMPRESSION MICRODISCECTOMY 1 LEVEL;  Surgeon: Otilio Connors, MD;  Location: [place] NEURO ORS;  Service: Neurosurgery;   Laterality: Bilateral;  LUMBAR LAMINECTOMY/DECOMPRESSION MICRODISCECTOMY 1 LEVEL  . PENILE PROSTHESIS IMPLANT  1994   x3  . right shoulder arthroscopic    . SHOULDER SURGERY    . SUPRAPUBIC CATHETER PLACEMENT     and removal  . URETHERAL RE-IMPLANTATION      Prior to Admission medications   Medication Sig Start Date End Date Taking? Authorizing Provider  acetaminophen (TYLENOL) 650 MG CR tablet Take by mouth.   Yes [provider]  amLODipine (NORVASC) 5 MG tablet Take 5 mg by mouth daily.   Yes [provider]  aspirin EC 81 MG tablet Take 81 mg by mouth daily.   Yes [provider]  glimepiride (AMARYL) 2 MG tablet Take 2 mg by mouth daily with breakfast.   Yes [provider]  Investigational - Study Medication Take 14 mg by mouth daily before breakfast. Study name: semaglutide 14 mg or placebo   Yes [provider]  losartan (COZAAR) 100 MG tablet Take 100 mg by mouth daily.   Yes [provider]  metFORMIN (GLUCOPHAGE) 500 MG tablet Take 1,000 mg by mouth 2 (two) times daily with a meal.   Yes [provider]  nitroGLYCERIN (NITROSTAT) 0.4 MG SL tablet Place 0.4 mg under the tongue every 5 (five) minutes as needed for chest pain.   Yes [provider]  Omega-3 Fatty Acids (FISH OIL PO) Take 2,000 mg by mouth 2 (two) times daily.   Yes [provider]  valsartan-hydrochlorothiazide (  DIOVAN-HCT) 160-12.5 MG tablet Take 1 tablet by mouth daily.   Yes [provider]  cyclobenzaprine (FLEXERIL) 5 MG tablet Take 1 tablet (5 mg total) by mouth at bedtime. Patient not taking: Reported on 04/15/2020 04/01/20   Sable Feil, PA-C    Allergies as of 02/26/2020 - Review Complete 12/31/2018  Allergen Reaction Noted  . Statins  08/23/2017  . Celebrex [celecoxib] Itching 10/20/2012    Family History  Problem Relation Age of Onset  . CAD Mother   . Heart attack Father     Social History    Socioeconomic History  . Marital status: Widowed    Spouse name: Not on file  . Number of children: Not on file  . Years of education: Not on file  . Highest education level: Not on file  Occupational History  . Occupation: Architect   Tobacco Use  . Smoking status: Former Smoker    Packs/day: 0.00    Years: 0.00    Pack years: 0.00  . Smokeless tobacco: Former Systems developer    Types: [place] date: Biochemist, clinical  . Vaping Use: Never used  Substance and Sexual Activity  . Alcohol use: Yes    Alcohol/week: 5.0 standard drinks    Types: 5 Cans of beer per week    Comment: not every day but few times per week  . Drug use: No  . Sexual activity: Not on file    Comment: Married   Other Topics Concern  . Not on file  Social History Narrative   Lives alone.  Daughter lives nearby. Melody Comas brady will also check on patient   Social Determinants of Health   Financial Resource Strain: Not on file  Food Insecurity: Not on file  Transportation Needs: Not on file  Physical Activity: Not on file  Stress: Not on file  Social Connections: Not on file  Intimate Partner Violence: Not on file    Review of Systems: See HPI, otherwise negative ROS  Physical Exam: BP (!) 153/73   Pulse 73   Temp (!) 97.4 F (36.3 C) (Temporal)   Resp 16   Ht '5\' 8"'  (1.727 m)   Wt 96.2 kg   SpO2 98%   BMI 32.23 kg/m  General:   Alert,  pleasant and cooperative in NAD Head:  Normocephalic and atraumatic. Lungs:  Clear to auscultation.    Heart:  Regular rate and rhythm.   Impression/Plan: Joshua Ferrell. is here for ophthalmic surgery.  Risks, benefits, limitations, and alternatives regarding ophthalmic surgery have been reviewed with the patient.  Questions have been answered.  All parties agreeable.   Leandrew Koyanagi, MD  04/23/2020, 7:36 AM

## 2020-04-23 NOTE — Transfer of Care (Signed)
Immediate Anesthesia Transfer of Care Note  Patient: Joshua Ferrell.  Procedure(s) Performed: CATARACT EXTRACTION PHACO AND INTRAOCULAR LENS PLACEMENT (IOC)LEFT DIABETIC 14.14 02:15.2 10.5% (Left Eye)  Patient Location: PACU  Anesthesia Type: MAC  Level of Consciousness: awake, alert  and patient cooperative  Airway and Oxygen Therapy: Patient Spontanous Breathing and Patient connected to supplemental oxygen  Post-op Assessment: Post-op Vital signs reviewed, Patient's Cardiovascular Status Stable, Respiratory Function Stable, Patent Airway and No signs of Nausea or vomiting  Post-op Vital Signs: Reviewed and stable  Complications: No complications documented.

## 2020-04-23 NOTE — Op Note (Signed)
OPERATIVE NOTE  Joshua Ferrell [id number] 04/23/2020   PREOPERATIVE DIAGNOSIS:  Nuclear sclerotic cataract left eye. H25.12   POSTOPERATIVE DIAGNOSIS:    Nuclear sclerotic cataract left eye.     PROCEDURE:  Phacoemusification with posterior chamber intraocular lens placement of the left eye  Ultrasound time: Procedure(s) with comments: CATARACT EXTRACTION PHACO AND INTRAOCULAR LENS PLACEMENT (IOC)LEFT DIABETIC 14.14 02:15.2 10.5% (Left) - Diabetic - oral meds  LENS:   Implant Name Type Inv. Item Serial No. Manufacturer Lot No. LRB No. Used Action  LENS IOL ACRYSOF IQ 22.5 - [id number] Intraocular Lens LENS IOL ACRYSOF IQ 22.5 [id number] ALCON  Left 1 Implanted      SURGEON:  Wyonia Hough, MD   ANESTHESIA:  Topical with tetracaine drops and 2% Xylocaine jelly, augmented with 1% preservative-free intracameral lidocaine.    COMPLICATIONS:  None.   DESCRIPTION OF PROCEDURE:  The patient was identified in the holding room and transported to the operating room and placed in the supine position under the operating microscope.  The left eye was identified as the operative eye and it was prepped and draped in the usual sterile ophthalmic fashion.   A 1 millimeter clear-corneal paracentesis was made at the 1:30 position.  0.5 ml of preservative-free 1% lidocaine was injected into the anterior chamber.  The anterior chamber was filled with Viscoat viscoelastic.  A 2.4 millimeter keratome was used to make a near-clear corneal incision at the 10:30 position.  .  A curvilinear capsulorrhexis was made with a cystotome and capsulorrhexis forceps.  Balanced salt solution was used to hydrodissect and hydrodelineate the nucleus.   Phacoemulsification was then used in stop and chop fashion to remove the lens nucleus and epinucleus.  The remaining cortex was then removed using the irrigation and aspiration handpiece. Provisc was then placed into the capsular bag to distend it for lens  placement.  A lens was then injected into the capsular bag.  The remaining viscoelastic was aspirated.   Wounds were hydrated with balanced salt solution.  The anterior chamber was inflated to a physiologic pressure with balanced salt solution.  No wound leaks were noted. Cefuroxime 0.1 ml of a 10mg /ml solution was injected into the anterior chamber for a dose of 1 mg of intracameral antibiotic at the completion of the case.   Timolol and Brimonidine drops were applied to the eye.  The patient was taken to the recovery room in stable condition without complications of anesthesia or surgery.  Joshua Ferrell 04/23/2020, 8:05 AM

## 2020-04-24 ENCOUNTER — Encounter: Payer: Self-pay | Admitting: Ophthalmology

## 2020-05-12 DIAGNOSIS — G720 Drug-induced myopathy: Secondary | ICD-10-CM | POA: Diagnosis not present

## 2020-05-12 DIAGNOSIS — I2510 Atherosclerotic heart disease of native coronary artery without angina pectoris: Secondary | ICD-10-CM | POA: Diagnosis not present

## 2020-05-12 DIAGNOSIS — E1121 Type 2 diabetes mellitus with diabetic nephropathy: Secondary | ICD-10-CM | POA: Diagnosis not present

## 2020-05-12 DIAGNOSIS — R079 Chest pain, unspecified: Secondary | ICD-10-CM | POA: Diagnosis not present

## 2020-05-12 DIAGNOSIS — T466X5A Adverse effect of antihyperlipidemic and antiarteriosclerotic drugs, initial encounter: Secondary | ICD-10-CM | POA: Diagnosis not present

## 2020-05-12 DIAGNOSIS — I252 Old myocardial infarction: Secondary | ICD-10-CM | POA: Diagnosis not present

## 2020-05-12 DIAGNOSIS — I10 Essential (primary) hypertension: Secondary | ICD-10-CM | POA: Diagnosis not present

## 2020-05-12 DIAGNOSIS — R0600 Dyspnea, unspecified: Secondary | ICD-10-CM | POA: Diagnosis not present

## 2020-05-12 DIAGNOSIS — E785 Hyperlipidemia, unspecified: Secondary | ICD-10-CM | POA: Diagnosis not present

## 2020-05-13 DIAGNOSIS — R519 Headache, unspecified: Secondary | ICD-10-CM | POA: Diagnosis not present

## 2020-05-19 DIAGNOSIS — T466X5A Adverse effect of antihyperlipidemic and antiarteriosclerotic drugs, initial encounter: Secondary | ICD-10-CM | POA: Diagnosis not present

## 2020-05-19 DIAGNOSIS — I10 Essential (primary) hypertension: Secondary | ICD-10-CM | POA: Diagnosis not present

## 2020-05-19 DIAGNOSIS — E1121 Type 2 diabetes mellitus with diabetic nephropathy: Secondary | ICD-10-CM | POA: Diagnosis not present

## 2020-05-19 DIAGNOSIS — I2510 Atherosclerotic heart disease of native coronary artery without angina pectoris: Secondary | ICD-10-CM | POA: Diagnosis not present

## 2020-05-19 DIAGNOSIS — G720 Drug-induced myopathy: Secondary | ICD-10-CM | POA: Diagnosis not present

## 2020-05-20 ENCOUNTER — Other Ambulatory Visit: Payer: Self-pay | Admitting: Neurology

## 2020-05-20 ENCOUNTER — Other Ambulatory Visit (HOSPITAL_COMMUNITY): Payer: Self-pay | Admitting: Neurology

## 2020-05-20 DIAGNOSIS — I25119 Atherosclerotic heart disease of native coronary artery with unspecified angina pectoris: Secondary | ICD-10-CM

## 2020-05-23 DIAGNOSIS — T1512XA Foreign body in conjunctival sac, left eye, initial encounter: Secondary | ICD-10-CM | POA: Diagnosis not present

## 2020-06-10 DIAGNOSIS — I2510 Atherosclerotic heart disease of native coronary artery without angina pectoris: Secondary | ICD-10-CM | POA: Diagnosis not present

## 2020-06-10 DIAGNOSIS — E1121 Type 2 diabetes mellitus with diabetic nephropathy: Secondary | ICD-10-CM | POA: Diagnosis not present

## 2020-06-10 DIAGNOSIS — R519 Headache, unspecified: Secondary | ICD-10-CM | POA: Diagnosis not present

## 2020-06-10 DIAGNOSIS — G8929 Other chronic pain: Secondary | ICD-10-CM | POA: Diagnosis not present

## 2020-06-10 DIAGNOSIS — M542 Cervicalgia: Secondary | ICD-10-CM | POA: Diagnosis not present

## 2020-06-10 DIAGNOSIS — M159 Polyosteoarthritis, unspecified: Secondary | ICD-10-CM | POA: Diagnosis not present

## 2020-06-10 DIAGNOSIS — I10 Essential (primary) hypertension: Secondary | ICD-10-CM | POA: Diagnosis not present

## 2020-06-10 DIAGNOSIS — M25511 Pain in right shoulder: Secondary | ICD-10-CM | POA: Diagnosis not present

## 2020-06-12 ENCOUNTER — Inpatient Hospital Stay
Admission: EM | Admit: 2020-06-12 | Discharge: 2020-06-14 | DRG: 247 | Disposition: A | Discharge status: Home / Self Care | Payer: PPO | Attending: Internal Medicine | Admitting: Internal Medicine

## 2020-06-12 ENCOUNTER — Encounter
Admission: EM | Disposition: A | Discharge status: Home / Self Care | Payer: Self-pay | Source: Home / Self Care | Attending: Internal Medicine

## 2020-06-12 ENCOUNTER — Encounter: Payer: Self-pay | Admitting: Anesthesiology

## 2020-06-12 ENCOUNTER — Ambulatory Visit: Payer: PPO

## 2020-06-12 ENCOUNTER — Emergency Department: Payer: PPO

## 2020-06-12 ENCOUNTER — Other Ambulatory Visit: Payer: Self-pay

## 2020-06-12 DIAGNOSIS — I252 Old myocardial infarction: Secondary | ICD-10-CM | POA: Diagnosis not present

## 2020-06-12 DIAGNOSIS — K219 Gastro-esophageal reflux disease without esophagitis: Secondary | ICD-10-CM | POA: Diagnosis present

## 2020-06-12 DIAGNOSIS — E119 Type 2 diabetes mellitus without complications: Secondary | ICD-10-CM

## 2020-06-12 DIAGNOSIS — I10 Essential (primary) hypertension: Secondary | ICD-10-CM | POA: Diagnosis not present

## 2020-06-12 DIAGNOSIS — Z7984 Long term (current) use of oral hypoglycemic drugs: Secondary | ICD-10-CM

## 2020-06-12 DIAGNOSIS — I2129 ST elevation (STEMI) myocardial infarction involving other sites: Principal | ICD-10-CM | POA: Diagnosis present

## 2020-06-12 DIAGNOSIS — E785 Hyperlipidemia, unspecified: Secondary | ICD-10-CM | POA: Diagnosis not present

## 2020-06-12 DIAGNOSIS — Z888 Allergy status to other drugs, medicaments and biological substances status: Secondary | ICD-10-CM

## 2020-06-12 DIAGNOSIS — Z8249 Family history of ischemic heart disease and other diseases of the circulatory system: Secondary | ICD-10-CM

## 2020-06-12 DIAGNOSIS — R0789 Other chest pain: Secondary | ICD-10-CM

## 2020-06-12 DIAGNOSIS — Z955 Presence of coronary angioplasty implant and graft: Secondary | ICD-10-CM

## 2020-06-12 DIAGNOSIS — R079 Chest pain, unspecified: Secondary | ICD-10-CM | POA: Diagnosis not present

## 2020-06-12 DIAGNOSIS — Z79899 Other long term (current) drug therapy: Secondary | ICD-10-CM

## 2020-06-12 DIAGNOSIS — I2510 Atherosclerotic heart disease of native coronary artery without angina pectoris: Secondary | ICD-10-CM | POA: Diagnosis present

## 2020-06-12 DIAGNOSIS — Z8616 Personal history of COVID-19: Secondary | ICD-10-CM | POA: Diagnosis not present

## 2020-06-12 DIAGNOSIS — I25110 Atherosclerotic heart disease of native coronary artery with unstable angina pectoris: Secondary | ICD-10-CM

## 2020-06-12 DIAGNOSIS — I213 ST elevation (STEMI) myocardial infarction of unspecified site: Secondary | ICD-10-CM | POA: Diagnosis not present

## 2020-06-12 DIAGNOSIS — I219 Acute myocardial infarction, unspecified: Secondary | ICD-10-CM | POA: Diagnosis not present

## 2020-06-12 DIAGNOSIS — Z20822 Contact with and (suspected) exposure to covid-19: Secondary | ICD-10-CM | POA: Diagnosis present

## 2020-06-12 DIAGNOSIS — M1990 Unspecified osteoarthritis, unspecified site: Secondary | ICD-10-CM | POA: Diagnosis not present

## 2020-06-12 DIAGNOSIS — E669 Obesity, unspecified: Secondary | ICD-10-CM | POA: Diagnosis not present

## 2020-06-12 DIAGNOSIS — I2582 Chronic total occlusion of coronary artery: Secondary | ICD-10-CM | POA: Diagnosis not present

## 2020-06-12 DIAGNOSIS — Z7982 Long term (current) use of aspirin: Secondary | ICD-10-CM | POA: Diagnosis not present

## 2020-06-12 DIAGNOSIS — I214 Non-ST elevation (NSTEMI) myocardial infarction: Secondary | ICD-10-CM

## 2020-06-12 DIAGNOSIS — I255 Ischemic cardiomyopathy: Secondary | ICD-10-CM | POA: Diagnosis not present

## 2020-06-12 DIAGNOSIS — I491 Atrial premature depolarization: Secondary | ICD-10-CM | POA: Diagnosis not present

## 2020-06-12 DIAGNOSIS — Z87891 Personal history of nicotine dependence: Secondary | ICD-10-CM

## 2020-06-12 DIAGNOSIS — R9431 Abnormal electrocardiogram [ECG] [EKG]: Secondary | ICD-10-CM | POA: Diagnosis not present

## 2020-06-12 HISTORY — PX: CORONARY/GRAFT ACUTE MI REVASCULARIZATION: CATH118305

## 2020-06-12 HISTORY — PX: LEFT HEART CATH AND CORONARY ANGIOGRAPHY: CATH118249

## 2020-06-12 HISTORY — DX: Non-ST elevation (NSTEMI) myocardial infarction: I21.4

## 2020-06-12 LAB — BASIC METABOLIC PANEL
Anion gap: 12 (ref 5–15)
BUN: 25 mg/dL — ABNORMAL HIGH (ref 8–23)
CO2: 22 mmol/L (ref 22–32)
Calcium: 9.4 mg/dL (ref 8.9–10.3)
Chloride: 101 mmol/L (ref 98–111)
Creatinine, Ser: 1.23 mg/dL (ref 0.61–1.24)
GFR, Estimated: >60 mL/min (ref >60)
Glucose, Bld: 261 mg/dL — ABNORMAL HIGH (ref 70–99)
Potassium: 4.1 mmol/L (ref 3.5–5.1)
Sodium: 135 mmol/L (ref 135–145)

## 2020-06-12 LAB — CBC
HCT: 37.7 % — ABNORMAL LOW (ref 39.0–52.0)
Hemoglobin: 13.2 g/dL (ref 13.0–17.0)
MCH: 33.2 pg (ref 26.0–34.0)
MCHC: 35 g/dL (ref 30.0–36.0)
MCV: 95 fL (ref 80.0–100.0)
Platelets: 177 10*3/uL (ref 150–400)
RBC: 3.97 MIL/uL — ABNORMAL LOW (ref 4.22–5.81)
RDW: 13.6 % (ref 11.5–15.5)
WBC: 9.7 10*3/uL (ref 4.0–10.5)
nRBC: 0 % (ref 0.0–0.2)

## 2020-06-12 LAB — TROPONIN I (HIGH SENSITIVITY)
Troponin I (High Sensitivity): 66 ng/L — ABNORMAL HIGH (ref <18)
Troponin I (High Sensitivity): 3531 ng/L (ref <18)
Troponin I (High Sensitivity): 11075 ng/L (ref <18)
Troponin I (High Sensitivity): 10781 ng/L (ref <18)

## 2020-06-12 LAB — RESP PANEL BY RT-PCR (FLU A&B, COVID) ARPGX2
Influenza A by PCR: NEGATIVE
Influenza B by PCR: NEGATIVE
SARS Coronavirus 2 by RT PCR: NEGATIVE

## 2020-06-12 LAB — GLUCOSE, CAPILLARY
Glucose-Capillary: 202 mg/dL — ABNORMAL HIGH (ref 70–99)
Glucose-Capillary: 157 mg/dL — ABNORMAL HIGH (ref 70–99)
Glucose-Capillary: 202 mg/dL — ABNORMAL HIGH (ref 70–99)

## 2020-06-12 LAB — SARS CORONAVIRUS 2 (TAT 6-24 HRS): SARS Coronavirus 2: NEGATIVE

## 2020-06-12 LAB — POCT ACTIVATED CLOTTING TIME: Activated Clotting Time: 267 seconds

## 2020-06-12 SURGERY — CORONARY/GRAFT ACUTE MI REVASCULARIZATION
Anesthesia: Moderate Sedation

## 2020-06-12 MED ORDER — HYDRALAZINE HCL 20 MG/ML IJ SOLN: 10.0000 mg | INTRAMUSCULAR | Status: AC | PRN

## 2020-06-12 MED ORDER — HEPARIN SODIUM (PORCINE) 1000 UNIT/ML IJ SOLN
INTRAMUSCULAR | Status: AC
  Filled 2020-06-12: qty 1

## 2020-06-12 MED ORDER — CLOPIDOGREL BISULFATE 75 MG PO TABS
75.0000 mg | ORAL_TABLET | Freq: Every day | ORAL | Status: DC
  Administered 2020-06-13 – 2020-06-14 (×2): 75 mg via ORAL
  Filled 2020-06-12 (×2): qty 1

## 2020-06-12 MED ORDER — FENTANYL CITRATE (PF) 100 MCG/2ML IJ SOLN: 50.0000 ug | Freq: Once | INTRAMUSCULAR | Status: AC

## 2020-06-12 MED ORDER — MIDAZOLAM HCL 2 MG/2ML IJ SOLN
INTRAMUSCULAR | Status: AC
  Filled 2020-06-12: qty 2

## 2020-06-12 MED ORDER — AMLODIPINE BESYLATE 5 MG PO TABS
5.0000 mg | ORAL_TABLET | Freq: Every day | ORAL | Status: DC
  Administered 2020-06-13 – 2020-06-14 (×2): 5 mg via ORAL
  Filled 2020-06-12 (×2): qty 1

## 2020-06-12 MED ORDER — FENTANYL CITRATE (PF) 100 MCG/2ML IJ SOLN
INTRAMUSCULAR | Status: AC
  Administered 2020-06-12: 50 ug via INTRAVENOUS
  Filled 2020-06-12: qty 2

## 2020-06-12 MED ORDER — INSULIN ASPART 100 UNIT/ML IJ SOLN
0.0000 [IU] | Freq: Three times a day (TID) | INTRAMUSCULAR | Status: DC
  Administered 2020-06-12: 3 [IU] via SUBCUTANEOUS
  Administered 2020-06-13: 5 [IU] via SUBCUTANEOUS
  Administered 2020-06-13: 8 [IU] via SUBCUTANEOUS
  Administered 2020-06-13: 3 [IU] via SUBCUTANEOUS
  Administered 2020-06-14: 5 [IU] via SUBCUTANEOUS
  Filled 2020-06-12 (×5): qty 1

## 2020-06-12 MED ORDER — CLOPIDOGREL BISULFATE 75 MG PO TABS
ORAL_TABLET | ORAL | Status: AC
  Filled 2020-06-12: qty 8

## 2020-06-12 MED ORDER — NITROGLYCERIN 0.4 MG SL SUBL: 0.4000 mg | SUBLINGUAL_TABLET | SUBLINGUAL | Status: DC | PRN

## 2020-06-12 MED ORDER — ENOXAPARIN SODIUM 60 MG/0.6ML IJ SOSY
50.0000 mg | PREFILLED_SYRINGE | INTRAMUSCULAR | Status: DC
  Administered 2020-06-12 – 2020-06-13 (×2): 50 mg via SUBCUTANEOUS
  Filled 2020-06-12 (×3): qty 0.5

## 2020-06-12 MED ORDER — HYDROCHLOROTHIAZIDE 12.5 MG PO CAPS
12.5000 mg | ORAL_CAPSULE | Freq: Every day | ORAL | Status: DC
  Administered 2020-06-12 – 2020-06-14 (×3): 12.5 mg via ORAL
  Filled 2020-06-12 (×3): qty 1

## 2020-06-12 MED ORDER — FENTANYL CITRATE (PF) 100 MCG/2ML IJ SOLN
INTRAMUSCULAR | Status: DC | PRN
  Administered 2020-06-12: 25 ug via INTRAVENOUS

## 2020-06-12 MED ORDER — VERAPAMIL HCL 2.5 MG/ML IV SOLN
INTRAVENOUS | Status: DC | PRN
  Administered 2020-06-12: 2.5 mg via INTRAVENOUS

## 2020-06-12 MED ORDER — SODIUM CHLORIDE 0.9% FLUSH
3.0000 mL | Freq: Two times a day (BID) | INTRAVENOUS | Status: DC
  Administered 2020-06-13 – 2020-06-14 (×3): 3 mL via INTRAVENOUS

## 2020-06-12 MED ORDER — ASPIRIN 81 MG PO CHEW
81.0000 mg | CHEWABLE_TABLET | Freq: Every day | ORAL | Status: DC
  Administered 2020-06-13 – 2020-06-14 (×2): 81 mg via ORAL
  Filled 2020-06-12 (×2): qty 1

## 2020-06-12 MED ORDER — ONDANSETRON HCL 4 MG/2ML IJ SOLN: 4.0000 mg | Freq: Four times a day (QID) | INTRAMUSCULAR | Status: DC | PRN

## 2020-06-12 MED ORDER — ACETAMINOPHEN 325 MG PO TABS: 650.0000 mg | ORAL_TABLET | ORAL | Status: DC | PRN

## 2020-06-12 MED ORDER — OMEGA-3-ACID ETHYL ESTERS 1 G PO CAPS
2.0000 g | ORAL_CAPSULE | Freq: Two times a day (BID) | ORAL | Status: DC
  Administered 2020-06-12 – 2020-06-14 (×4): 2 g via ORAL
  Filled 2020-06-12 (×4): qty 2

## 2020-06-12 MED ORDER — ASPIRIN EC 81 MG PO TBEC: 81.0000 mg | DELAYED_RELEASE_TABLET | Freq: Every day | ORAL | Status: DC

## 2020-06-12 MED ORDER — VERAPAMIL HCL 2.5 MG/ML IV SOLN
INTRAVENOUS | Status: AC
  Filled 2020-06-12: qty 2

## 2020-06-12 MED ORDER — VALSARTAN-HYDROCHLOROTHIAZIDE 160-12.5 MG PO TABS: 1.0000 | ORAL_TABLET | Freq: Every day | ORAL | Status: DC

## 2020-06-12 MED ORDER — SODIUM CHLORIDE 0.9 % WEIGHT BASED INFUSION
1.0000 mL/kg/h | INTRAVENOUS | Status: AC
  Administered 2020-06-12: 1 mL/kg/h via INTRAVENOUS

## 2020-06-12 MED ORDER — SODIUM CHLORIDE 0.9 % IV SOLN: 250.0000 mL | INTRAVENOUS | Status: DC | PRN

## 2020-06-12 MED ORDER — MIDAZOLAM HCL 2 MG/2ML IJ SOLN
INTRAMUSCULAR | Status: DC | PRN
  Administered 2020-06-12: 1 mg via INTRAVENOUS

## 2020-06-12 MED ORDER — LABETALOL HCL 5 MG/ML IV SOLN: 10.0000 mg | INTRAVENOUS | Status: AC | PRN

## 2020-06-12 MED ORDER — HEPARIN (PORCINE) IN NACL 2000-0.9 UNIT/L-% IV SOLN
INTRAVENOUS | Status: DC | PRN
  Administered 2020-06-12: 1000 mL

## 2020-06-12 MED ORDER — IRBESARTAN 150 MG PO TABS
150.0000 mg | ORAL_TABLET | Freq: Every day | ORAL | Status: DC
  Administered 2020-06-12 – 2020-06-14 (×3): 150 mg via ORAL
  Filled 2020-06-12 (×3): qty 1

## 2020-06-12 MED ORDER — HEPARIN (PORCINE) IN NACL 1000-0.9 UT/500ML-% IV SOLN
INTRAVENOUS | Status: AC
  Filled 2020-06-12: qty 1000

## 2020-06-12 MED ORDER — HEPARIN SODIUM (PORCINE) 1000 UNIT/ML IJ SOLN
INTRAMUSCULAR | Status: DC | PRN
  Administered 2020-06-12: 5000 [IU] via INTRAVENOUS
  Administered 2020-06-12: 3000 [IU] via INTRAVENOUS
  Administered 2020-06-12: 2000 [IU] via INTRAVENOUS
  Administered 2020-06-12: 7000 [IU] via INTRAVENOUS

## 2020-06-12 MED ORDER — IOHEXOL 300 MG/ML  SOLN
INTRAMUSCULAR | Status: DC | PRN
  Administered 2020-06-12: 250 mL

## 2020-06-12 MED ORDER — CLOPIDOGREL BISULFATE 75 MG PO TABS: 75.0000 mg | ORAL_TABLET | Freq: Every day | ORAL | Status: DC

## 2020-06-12 MED ORDER — ACETAMINOPHEN ER 650 MG PO TBCR: 650.0000 mg | EXTENDED_RELEASE_TABLET | Freq: Three times a day (TID) | ORAL | Status: DC | PRN

## 2020-06-12 MED ORDER — ACETAMINOPHEN 325 MG PO TABS: 650.0000 mg | ORAL_TABLET | Freq: Four times a day (QID) | ORAL | Status: DC | PRN

## 2020-06-12 MED ORDER — SODIUM CHLORIDE 0.9% FLUSH: 3.0000 mL | INTRAVENOUS | Status: DC | PRN

## 2020-06-12 MED ORDER — CLOPIDOGREL BISULFATE 75 MG PO TABS
ORAL_TABLET | ORAL | Status: DC | PRN
  Administered 2020-06-12: 600 mg via ORAL

## 2020-06-12 MED ORDER — NITROGLYCERIN 0.4 MG SL SUBL
SUBLINGUAL_TABLET | SUBLINGUAL | Status: AC
  Filled 2020-06-12: qty 3

## 2020-06-12 MED ORDER — FENTANYL CITRATE (PF) 100 MCG/2ML IJ SOLN
INTRAMUSCULAR | Status: AC
  Filled 2020-06-12: qty 2

## 2020-06-12 SURGICAL SUPPLY — 23 items
BALLN TREK RX 2.5X12 (BALLOONS) ×4
BALLN ~~LOC~~ EUPHORA RX 3.0X15 (BALLOONS) ×2
BALLOON TREK RX 2.5X12 (BALLOONS) IMPLANT
BALLOON ~~LOC~~ EUPHORA RX 3.0X15 (BALLOONS) IMPLANT
CATH INFINITI 5 FR JL3.5 (CATHETERS) ×1 IMPLANT
CATH INFINITI 5FR ANG PIGTAIL (CATHETERS) ×1 IMPLANT
CATH INFINITI 5FR JL4 (CATHETERS) ×1 IMPLANT
CATH INFINITI JR4 5F (CATHETERS) ×1 IMPLANT
CATH LAUNCHER 6FR 3DRC (CATHETERS) IMPLANT
CATH VISTA GUIDE 6FR XBLAD3.5 (CATHETERS) ×1 IMPLANT
CATHETER LAUNCHER 6FR 3DRC (CATHETERS) ×2
DEVICE RAD TR BAND REGULAR (VASCULAR PRODUCTS) ×1 IMPLANT
DRAPE BRACHIAL (DRAPES) ×1 IMPLANT
GLIDESHEATH SLEND SS 6F .021 (SHEATH) ×1 IMPLANT
GUIDEWIRE INQWIRE 1.5J.035X260 (WIRE) IMPLANT
INQWIRE 1.5J .035X260CM (WIRE) ×2
KIT ENCORE 26 ADVANTAGE (KITS) ×1 IMPLANT
PACK CARDIAC CATH (CUSTOM PROCEDURE TRAY) ×2 IMPLANT
PROTECTION STATION PRESSURIZED (MISCELLANEOUS) ×2
SET ATX SIMPLICITY (MISCELLANEOUS) ×1 IMPLANT
STATION PROTECTION PRESSURIZED (MISCELLANEOUS) IMPLANT
STENT RESOLUTE ONYX 2.75X15 (Permanent Stent) ×1 IMPLANT
WIRE ASAHI PROWATER 180CM (WIRE) ×1 IMPLANT

## 2020-06-12 NOTE — ED Triage Notes (Signed)
Pt to ED [facility] from home for CP centralized, radiating to both arms. Denies shob, N/V Hx MI Initial BP over 200 2 inch nitro on by EMS Took 324 ASA PTA Pt RR even and unlabored, alert and oriented

## 2020-06-12 NOTE — Progress Notes (Addendum)
Pt admitted to ICU from Cath lab near 1200. Upon arrival to ICU, TR band @ 14 cc; deflated down to 5 cc, Level 1/small amount of blood underneath band, TR band left in place.   Troponin drawn upon arrival = 3531  Second check @ 1611 = 11,075  Third check @ 1822 = 10,781  Dr. Saralyn Pilar notified of each critical value.   No complaints of chest pain, VSS, no neuro changes.

## 2020-06-12 NOTE — H&P (Signed)
History and Physical    Joshua Ferrell. KVQ:[id number] DOB: 15-Feb-1945 DOA: 06/12/2020  PCP: Kirk Ruths, MD   Patient coming from: Home  I have personally briefly reviewed patient's old medical records in [place]  Chief Complaint: Chest pain  HPI: Joshua Ferrell. is a 75 y.o. male with medical history significant for diabetes mellitus, coronary artery disease, GERD, hypertension and dyslipidemia presents to the ER via EMS for evaluation of chest pain.  Chest pain was mostly in the center of his chest and described as a stabbing pain with radiation into both arms and associated with diaphoresis, shortness of breath and nausea.  He rated his pain a 6/10 in intensity at its worst and states that it feels like the last time he had an MI.  Blood pressure was elevated at 643 systolic when he arrived. He denied having any fever, no chills, no cough, no abdominal pain, no changes in his bowel habits, no lightheadedness, no headache, no urinary symptoms, no blurred vision no focal deficits. Labs show sodium 135, potassium 4.1, chloride 101, bicarb 22, glucose 261, BUN 25, creatinine 1.23, calcium 9.1 troponin 66 >> 3531, white count 9.7, hemoglobin 13.2, hematocrit 37.7, MCV 95, RDW 13.6, platelet count 177 Respiratory viral panel is negative Twelve-lead EKG reviewed by me shows ST elevation in leads aVR and III with ST depression in leads I, aVL, V4, V5 and V6   ED Course: Patient is a 75 year old male with a history of coronary artery disease status post stent angioplasty who presents to the ER for evaluation of chest pain which felt similar to his prior episode of MI. Twelve-lead EKG showed some ST elevation in leads III and aVR with ST depression in multiple leads and a code STEMI was called. Patient was taken emergently to the Cath Lab and underwent cardiac catheterization which revealed chronically occluded proximal RCA with collaterals, 95% stenosis proximal LAD  and 70% stenosis RPL2 and RPL 3.  Patient underwent PCI receiving a drug-eluting stent in the proximal LAD.   Review of Systems: As per HPI otherwise all other systems reviewed and negative.    Past Medical History:  Diagnosis Date  . Arthritis   . Coronary artery disease   . COVID-19 12/2018  . Diabetes mellitus without complication ([place])   . Dyspnea    with exertion  . GERD (gastroesophageal reflux disease)    minimal episodes  . Headache(784.0)   . Heart disease   . History of blood transfusion 1990   internal injuries after a fall requiring blood  . History of colonic polyps   . Hyperglycemia   . Hyperlipidemia   . Hypertension   . Myocardial infarction ([place]) 2003  . PONV (postoperative nausea and vomiting)    one time only (surgery in 1990)    Past Surgical History:  Procedure Laterality Date  . ABDOMINAL SURGERY     patient unaware of this surgery  . BACK SURGERY    . CATARACT EXTRACTION W/PHACO Right 08/13/2015   Procedure: CATARACT EXTRACTION PHACO AND INTRAOCULAR LENS PLACEMENT (IOC);  Surgeon: Estill Cotta, MD;  Location: [facility] ORS;  Service: Ophthalmology;  Laterality: Right;  [place] 01:08 AP% 24.9 CDE 32.40 fluid pack lot # [id number]  . CATARACT EXTRACTION W/PHACO Left 04/23/2020   Procedure: CATARACT EXTRACTION PHACO AND INTRAOCULAR LENS PLACEMENT (IOC)LEFT DIABETIC 14.14 02:15.2 10.5%;  Surgeon: Leandrew Koyanagi, MD;  Location: [facility];  Service: Ophthalmology;  Laterality: Left;  Diabetic - oral meds  .  COLONOSCOPY    . COLONOSCOPY WITH PROPOFOL N/A 08/24/2017   Procedure: COLONOSCOPY WITH PROPOFOL;  Surgeon: Manya Silvas, MD;  Location: [facility] END[facility]COPY;  Service: Endoscopy;  Laterality: N/A;  . CORONARY ANGIOPLASTY     one stent placed  . CORONARY/GRAFT ACUTE MI REVASCULARIZATION N/A 06/12/2020   Procedure: Coronary/Graft Acute MI Revascularization;  Surgeon: Isaias Cowman, MD;  Location: [facility] CV LAB;  Service:  Cardiovascular;  Laterality: N/A;  . EYE SURGERY     cataract surgery  . LEFT HEART CATH AND CORONARY ANGIOGRAPHY N/A 06/12/2020   Procedure: LEFT HEART CATH AND CORONARY ANGIOGRAPHY;  Surgeon: Isaias Cowman, MD;  Location: [facility] CV LAB;  Service: Cardiovascular;  Laterality: N/A;  . LUMBAR LAMINECTOMY/ DECOMPRESSION WITH MET-RX N/A 10/30/2018   Procedure: L4-5 LUMBAR LAMINECTOMY;  Surgeon: Deetta Perla, MD;  Location: [facility] ORS;  Service: Neurosurgery;  Laterality: N/A;  . LUMBAR LAMINECTOMY/DECOMPRESSION MICRODISCECTOMY Bilateral 10/23/2012   Procedure: LUMBAR LAMINECTOMY/DECOMPRESSION MICRODISCECTOMY 1 LEVEL;  Surgeon: Otilio Connors, MD;  Location: [facility]URO ORS;  Service: Neurosurgery;  Laterality: Bilateral;  LUMBAR LAMINECTOMY/DECOMPRESSION MICRODISCECTOMY 1 LEVEL  . PENILE PROSTHESIS IMPLANT  1994   x3  . right shoulder arthroscopic    . SHOULDER SURGERY    . SUPRAPUBIC CATHETER PLACEMENT     and removal  . URETHERAL RE-IMPLANTATION       reports that he has quit smoking. He smoked 0.00 packs per day for 0.00 years. He quit smokeless tobacco use about 32 years ago.  His smokeless tobacco use included chew. He reports current alcohol use of about 5.0 standard drinks of alcohol per week. He reports that he does not use drugs.  Allergies  Allergen Reactions  . Statins     Muscle pain   . Celebrex [Celecoxib] Itching    Family History  Problem Relation Age of Onset  . CAD Mother   . Heart attack Father       Prior to Admission medications   Medication Sig Start Date End Date Taking? Authorizing Provider  acetaminophen (TYLENOL) 650 MG CR tablet Take by mouth.    [provider]  amLODipine (NORVASC) 5 MG tablet Take 5 mg by mouth daily.    [provider]  aspirin EC 81 MG tablet Take 81 mg by mouth daily.    [provider]  cyclobenzaprine (FLEXERIL) 5 MG tablet Take 1 tablet (5 mg total) by mouth at bedtime. Patient not taking:  Reported on 04/15/2020 04/01/20   Sable Feil, PA-C  glimepiride (AMARYL) 2 MG tablet Take 2 mg by mouth daily with breakfast.    [provider]  Investigational - Study Medication Take 14 mg by mouth daily before breakfast. Study name: semaglutide 14 mg or placebo    [provider]  losartan (COZAAR) 100 MG tablet Take 100 mg by mouth daily.    [provider]  metFORMIN (GLUCOPHAGE) 500 MG tablet Take 1,000 mg by mouth 2 (two) times daily with a meal.    [provider]  nitroGLYCERIN (NITROSTAT) 0.4 MG SL tablet Place 0.4 mg under the tongue every 5 (five) minutes as needed for chest pain.    [provider]  Omega-3 Fatty Acids (FISH OIL PO) Take 2,000 mg by mouth 2 (two) times daily.    [provider]  valsartan-hydrochlorothiazide (DIOVAN-HCT) 160-12.5 MG tablet Take 1 tablet by mouth daily.    [provider]    Physical Exam: Vitals:   06/12/20 6979 06/12/20 4801 06/12/20  1200 06/12/20 1300  BP: (!) 149/130  132/72 134/70  Pulse: 77  75 72  Resp: _0 Temp:   97.9 F (36.6 C)   TempSrc:   Oral   SpO2: 97% 97% 97% 96%  Weight:   98.3 kg   Height:   _1  (1.727 m)      Vitals:   06/12/20 0941 06/12/20 0953 06/12/20 1200 06/12/20 1300  BP: (!) 149/130  132/72 134/70  Pulse: 77  75 72  Resp: _2 Temp:   97.9 F (36.6 C)   TempSrc:   Oral   SpO2: 97% 97% 97% 96%  Weight:   98.3 kg   Height:   _3  (1.727 m)       Constitutional: Alert and oriented x 3 . Not in any apparent distress HEENT:      Head: Normocephalic and atraumatic.         Eyes: PERLA, EOMI, Conjunctivae are normal. Sclera is non-icteric.       Mouth/Throat: Mucous membranes are moist.       Neck: Supple with no signs of meningismus. Cardiovascular: Regular rate and rhythm. No murmurs, gallops, or rubs. 2+ symmetrical distal pulses are present . No JVD. No LE edema Respiratory: Respiratory effort normal .Lungs sounds  clear bilaterally. No wheezes, crackles, or rhonchi.  Gastrointestinal: Soft, non tender, and non distended with positive bowel sounds.  Central adiposity Genitourinary: No CVA tenderness. Musculoskeletal: Nontender with normal range of motion in all extremities. No cyanosis, or erythema of extremities. Neurologic:  Face is symmetric. Moving all extremities. No gross focal neurologic deficits . Skin: Skin is warm, dry.  No rash or ulcers Psychiatric: Mood and affect are normal   Labs on Admission: I have personally reviewed following labs and imaging studies  CBC: Recent Labs  Lab 06/12/20 0916  WBC 9.7  HGB 13.2  HCT 37.7*  MCV 95.0  PLT 829   Basic Metabolic Panel: Recent Labs  Lab 06/12/20 0916  NA 135  K 4.1  CL 101  CO2 22  GLUCOSE 261*  BUN 25*  CREATININE 1.23  CALCIUM 9.4   GFR: Estimated Creatinine Clearance: 57.2 mL/min (by C-G formula based on SCr of 1.23 mg/dL). Liver Function Tests: No results for input(s): AST, ALT, ALKPHOS, BILITOT, PROT, ALBUMIN in the last 168 hours. No results for input(s): LIPASE, AMYLASE in the last 168 hours. No results for input(s): AMMONIA in the last 168 hours. Coagulation Profile: No results for input(s): INR, PROTIME in the last 168 hours. Cardiac Enzymes: No results for input(s): CKTOTAL, CKMB, CKMBINDEX, TROPONINI in the last 168 hours. BNP (last 3 results) No results for input(s): PROBNP in the last 8760 hours. HbA1C: No results for input(s): HGBA1C in the last 72 hours. CBG: Recent Labs  Lab 06/12/20 1202  GLUCAP 202*   Lipid Profile: No results for input(s): CHOL, HDL, LDLCALC, TRIG, CHOLHDL, LDLDIRECT in the last 72 hours. Thyroid Function Tests: No results for input(s): TSH, T4TOTAL, FREET4, T3FREE, THYROIDAB in the last 72 hours. Anemia Panel: No results for input(s): VITAMINB12, FOLATE, FERRITIN, TIBC, IRON, RETICCTPCT in the last 72 hours. Urine analysis:    Component Value Date/Time   COLORURINE  YELLOW (A) 10/17/2018 1403   APPEARANCEUR CLEAR (A) 10/17/2018 1403   LABSPEC 1.006 10/17/2018 1403   PHURINE 5.0 10/17/2018 1403   GLUCOSEU >=500 (A) 10/17/2018 1403   HGBUR NEGATIVE 10/17/2018 [place] 10/17/2018 1403   KETONESUR  NEGATIVE 10/17/2018 1403   PROTEINUR NEGATIVE 10/17/2018 1403   NITRITE NEGATIVE 10/17/2018 [place] 10/17/2018 1403    Radiological Exams on Admission: CARDIAC CATHETERIZATION  Result Date: 06/12/2020  Ost RCA lesion is 100% stenosed.  Ost LAD to Prox LAD lesion is 95% stenosed.  2nd LPL lesion is 70% stenosed.  3rd LPL lesion is 70% stenosed.  Prox Cx lesion is 30% stenosed.  Mid LAD lesion is 50% stenosed.  A drug-eluting stent was successfully placed using a STENT RESOLUTE ONYX [id number].  Post intervention, there is a 0% residual stenosis.  1.  Vessel coronary artery disease with chronically occluded proximal RCA and 95% stenosis proximal LAD, with 70% stenosis LPL 2 and LPL 3 2.  Mildly reduced left ventricular function with estimated LV ejection fraction 45 to 50% 3.  Successful PCI with DES proximal LAD Recommendations 1.  Dual antiplatelet therapy uninterrupted for 1 year 2.  Aggressive risk factor modification     Assessment/Plan Principal Problem:   Acute ST elevation myocardial infarction (STEMI) of lateral wall (HCC) Active Problems:   Coronary artery disease   Hypertension   Diabetes mellitus without complication (HCC)     Acute ST elevation MI In a patient with known coronary artery disease Other risk factors include diabetes mellitus and hypertension Patient is status post cardiac catheterization which revealed chronically occluded proximal RCA with collaterals, 95% stenosis proximal LAD and 70% stenosis RPL 2 and RPL 3.  Patient underwent PCI, receiving DES to proximal LAD. Continue aspirin and start patient on Plavix Continue fish oil, patient not on statin due to allergy    Diabetes  mellitus Hold metformin and glimepiride Place patient on consistent carbohydrate diet Glycemic control with sliding scale insulin    Hypertension Continue valsartan/hydrochlorothiazide as well as amlodipine   DVT prophylaxis: Lovenox Code Status: full code Family Communication: Greater than 50% of time was spent discussing patient's condition and plan of care with him at the bedside.  All questions and concerns have been addressed.  He verbalizes understanding and agrees with the plan.  CODE STATUS was discussed and he is a full code. Disposition Plan: Back to previous home environment Consults called: Cardiology Status: At the time of admission, it appears that the appropriate admission status for this patient is inpatient. This is judged to be reasonable and necessary in order to provide the required intensity of service to ensure the patient's safety given the presenting symptoms, physical exam findings and initial radiographic and laboratory data in the context of comorbid conditions. Patient requires inpatient status due to high intensity of service, high risk for further deterioration and high frequency of surveillance required.    Collier Bullock MD [facility]     06/12/2020, 1:55 PM

## 2020-06-12 NOTE — ED Triage Notes (Signed)
Pt comes into the ED via [facility] from home c/o chest pain that was sudden onset an hour ago.  Pt has central chest pain that is a stabbing pain and radiates into both arms.  Pt was supposed to have outpatient tests today to check arteries.  668 systolic pressure.  2" nitro on chest and 1 spray.  Last 178/94.  Multifocal PVC and trigeminy with EMS.  20g IV left AC.  Pt had dizziness when standing, but denies any nausea or SHOB.  H/o MI in the past.

## 2020-06-12 NOTE — Progress Notes (Signed)
PHARMACIST - PHYSICIAN COMMUNICATION  CONCERNING:  Enoxaparin (Lovenox) for DVT Prophylaxis    RECOMMENDATION: Patient was prescribed enoxaprin 40mg  q24 hours for VTE prophylaxis.   Filed Weights   06/12/20 0918 06/12/20 1200  Weight: 98.4 kg (217 lb) 98.3 kg (216 lb 11.4 oz)    Body mass index is 32.95 kg/m.  Estimated Creatinine Clearance: 57.2 mL/min (by C-G formula based on SCr of 1.23 mg/dL).   Based on [place] patient is candidate for enoxaparin 0.5mg /kg TBW SQ every 24 hours based on BMI being >30.   DESCRIPTION: Pharmacy has adjusted enoxaparin dose per [facility] policy.  Patient is now receiving enoxaparin 50 mg every 24 hours    Dorothe Pea, PharmD, BCPS 06/12/2020 3:04 PM

## 2020-06-12 NOTE — Consult Note (Signed)
[facility] Cardiology  CARDIOLOGY CONSULT NOTE  Patient ID: Joshua Ferrell. MRN: [id number] DOB/AGE: [birth year] 75 y.o.  Admit date: 06/12/2020 Referring Physician Tamala Julian Primary Physician [facility] Primary Cardiologist Dustyn Armbrister Reason for Consultation lateral STEMI  HPI: 75 year old gentleman referred for evaluation for lateral STEMI.  Patient was in his usual state of health until early this morning when he developed 8 out of 10 chest pain.  He presented to [facility] ED where ECG revealed elevation in lead aVR and lead III with ST depression in leads I, aVL, V4 V5 and V6.  Patient has known coronary artery disease, history of inferior STEMI with stent mid RCA 06/24/2000.  2D echocardiogram 10/19/2018 revealed LVEF 50%.  The patient underwent cardiac catheterization which revealed chronically occluded proximal RCA with collaterals, 95% stenosis proximal LAD, and 70% stenosis RPL 2 and RPL 3.  Left ventriculography revealed mild use left ventricular function.  The patient underwent PCI, receiving DES in proximal LAD.  Review of systems complete and found to be negative unless listed above     Past Medical History:  Diagnosis Date  . Arthritis   . Coronary artery disease   . COVID-19 12/2018  . Diabetes mellitus without complication ([place])   . Dyspnea    with exertion  . GERD (gastroesophageal reflux disease)    minimal episodes  . Headache(784.0)   . Heart disease   . History of blood transfusion 1990   internal injuries after a fall requiring blood  . History of colonic polyps   . Hyperglycemia   . Hyperlipidemia   . Hypertension   . Myocardial infarction ([place]) 2003  . PONV (postoperative nausea and vomiting)    one time only (surgery in 1990)    Past Surgical History:  Procedure Laterality Date  . ABDOMINAL SURGERY     patient unaware of this surgery  . BACK SURGERY    . CATARACT EXTRACTION W/PHACO Right 08/13/2015   Procedure: CATARACT EXTRACTION PHACO AND INTRAOCULAR LENS  PLACEMENT (IOC);  Surgeon: Estill Cotta, MD;  Location: [facility] ORS;  Service: Ophthalmology;  Laterality: Right;  [place] 01:08 AP% 24.9 CDE 32.40 fluid pack lot # [id number]  . CATARACT EXTRACTION W/PHACO Left 04/23/2020   Procedure: CATARACT EXTRACTION PHACO AND INTRAOCULAR LENS PLACEMENT (IOC)LEFT DIABETIC 14.14 02:15.2 10.5%;  Surgeon: Leandrew Koyanagi, MD;  Location: [facility];  Service: Ophthalmology;  Laterality: Left;  Diabetic - oral meds  . COLONOSCOPY    . COLONOSCOPY WITH PROPOFOL N/A 08/24/2017   Procedure: COLONOSCOPY WITH PROPOFOL;  Surgeon: Manya Silvas, MD;  Location: [facility] ENDOSCOPY;  Service: Endoscopy;  Laterality: N/A;  . CORONARY ANGIOPLASTY     one stent placed  . EYE SURGERY     cataract surgery  . LUMBAR LAMINECTOMY/ DECOMPRESSION WITH MET-RX N/A 10/30/2018   Procedure: L4-5 LUMBAR LAMINECTOMY;  Surgeon: Deetta Perla, MD;  Location: [facility] ORS;  Service: Neurosurgery;  Laterality: N/A;  . LUMBAR LAMINECTOMY/DECOMPRESSION MICRODISCECTOMY Bilateral 10/23/2012   Procedure: LUMBAR LAMINECTOMY/DECOMPRESSION MICRODISCECTOMY 1 LEVEL;  Surgeon: Otilio Connors, MD;  Location: [place] NEURO ORS;  Service: Neurosurgery;  Laterality: Bilateral;  LUMBAR LAMINECTOMY/DECOMPRESSION MICRODISCECTOMY 1 LEVEL  . PENILE PROSTHESIS IMPLANT  1994   x3  . right shoulder arthroscopic    . SHOULDER SURGERY    . SUPRAPUBIC CATHETER PLACEMENT     and removal  . URETHERAL RE-IMPLANTATION      Medications Prior to Admission  Medication Sig Dispense Refill Last Dose  . acetaminophen (TYLENOL) 650 MG CR tablet Take by mouth.     Marland Kitchen  amLODipine (NORVASC) 5 MG tablet Take 5 mg by mouth daily.     Marland Kitchen aspirin EC 81 MG tablet Take 81 mg by mouth daily.     . cyclobenzaprine (FLEXERIL) 5 MG tablet Take 1 tablet (5 mg total) by mouth at bedtime. (Patient not taking: Reported on 04/15/2020) 10 tablet 0   . glimepiride (AMARYL) 2 MG tablet Take 2 mg by mouth daily with breakfast.     . Investigational  - Study Medication Take 14 mg by mouth daily before breakfast. Study name: semaglutide 14 mg or placebo     . losartan (COZAAR) 100 MG tablet Take 100 mg by mouth daily.     . metFORMIN (GLUCOPHAGE) 500 MG tablet Take 1,000 mg by mouth 2 (two) times daily with a meal.     . nitroGLYCERIN (NITROSTAT) 0.4 MG SL tablet Place 0.4 mg under the tongue every 5 (five) minutes as needed for chest pain.     . Omega-3 Fatty Acids (FISH OIL PO) Take 2,000 mg by mouth 2 (two) times daily.     . valsartan-hydrochlorothiazide (DIOVAN-HCT) 160-12.5 MG tablet Take 1 tablet by mouth daily.      Social History   Socioeconomic History  . Marital status: Widowed    Spouse name: Not on file  . Number of children: Not on file  . Years of education: Not on file  . Highest education level: Not on file  Occupational History  . Occupation: Architect   Tobacco Use  . Smoking status: Former Smoker    Packs/day: 0.00    Years: 0.00    Pack years: 0.00  . Smokeless tobacco: Former Systems developer    Types: [place] date: Biochemist, clinical  . Vaping Use: Never used  Substance and Sexual Activity  . Alcohol use: Yes    Alcohol/week: 5.0 standard drinks    Types: 5 Cans of beer per week    Comment: not every day but few times per week  . Drug use: No  . Sexual activity: Not on file    Comment: Married   Other Topics Concern  . Not on file  Social History Narrative   Lives alone.  Daughter lives nearby. Melody Comas brady will also check on patient   Social Determinants of Health   Financial Resource Strain: Not on file  Food Insecurity: Not on file  Transportation Needs: Not on file  Physical Activity: Not on file  Stress: Not on file  Social Connections: Not on file  Intimate Partner Violence: Not on file    Family History  Problem Relation Age of Onset  . CAD Mother   . Heart attack Father       Review of systems complete and found to be negative unless listed above      PHYSICAL  EXAM  General: Well developed, well nourished, in no acute distress HEENT:  Normocephalic and atramatic Neck:  No JVD.  Lungs: Clear bilaterally to auscultation and percussion. Heart: HRRR . Normal S1 and S2 without gallops or murmurs.  Abdomen: Bowel sounds are positive, abdomen soft and non-tender  Msk:  Back normal, normal gait. Normal strength and tone for age. Extremities: No clubbing, cyanosis or edema.   Neuro: Alert and oriented X 3. Psych:  Good affect, responds appropriately  Labs:   Lab Results  Component Value Date   WBC 9.7 06/12/2020   HGB 13.2 06/12/2020   HCT 37.7 (L) 06/12/2020   MCV 95.0 06/12/2020  PLT 177 06/12/2020    Recent Labs  Lab 06/12/20 0916  NA 135  K 4.1  CL 101  CO2 22  BUN 25*  CREATININE 1.23  CALCIUM 9.4  GLUCOSE 261*   No results found for: CKTOTAL, CKMB, CKMBINDEX, TROPONINI No results found for: CHOL No results found for: HDL No results found for: LDLCALC No results found for: TRIG No results found for: CHOLHDL No results found for: LDLDIRECT    Radiology: CARDIAC CATHETERIZATION  Result Date: 06/12/2020  Ost RCA lesion is 100% stenosed.  Ost LAD to Prox LAD lesion is 95% stenosed.  2nd LPL lesion is 70% stenosed.  3rd LPL lesion is 70% stenosed.  Prox Cx lesion is 30% stenosed.  Mid LAD lesion is 50% stenosed.  A drug-eluting stent was successfully placed using a STENT RESOLUTE ONYX [id number].  Post intervention, there is a 0% residual stenosis.  1.  Vessel coronary artery disease with chronically occluded proximal RCA and 95% stenosis proximal LAD, with 70% stenosis LPL 2 and LPL 3 2.  Mildly reduced left ventricular function with estimated LV ejection fraction 45 to 50% 3.  Successful PCI with DES proximal LAD Recommendations 1.  Dual antiplatelet therapy uninterrupted for 1 year 2.  Aggressive risk factor modification    EKG: Sinus rhythm with ST elevation in aVR and lead III with ST depression in leads I, aVL, V4 through  V6  ASSESSMENT AND PLAN:   1.  Possible lateral STEMI versus non-ST elevation myocardial infarction, with chronically occluded RCA with left-to-right collaterals, high-grade stenosis proximal LAD with successful PCI with DES proximal LAD.  Left ventriculography reveals mild reduced left ventricular function. 2.  Essential hypertension 3.  Hyperlipidemia  Recommendations  1.  Dual antiplatelet therapy uninterrupted for 1 year 2.  High intensity atorvastatin 3.  2D echocardiogram  Signed: Isaias Cowman MD,PhD, [facility] 06/12/2020, 11:44 AM

## 2020-06-12 NOTE — ED Provider Notes (Signed)
[facility] Emergency Department Provider Note ____________________________________________   Event Date/Time   First MD Initiated Contact with Patient 06/12/20 [phone]     (approximate)  I have reviewed the triage vital signs and the nursing notes.  HISTORY  Chief Complaint Chest Pain   HPI Joshua Ferrell. is a 75 y.o. malewho presents to the ED for evaluation of chest pain.   Chart review indicates hx obesity, HTN, HLD, DM and CAD.  Follows with Dr. Saralyn Pilar.   Presents to the ED with 2 hours of acute chest pain that started this morning while he was ambulating around his home.  Reports initially 10/10 pain rating to his bilateral arms and bilateral neck.  Denies nausea, emesis, diaphoresis or syncope.  Denies falls or injury.  Reports persistent pain, improved with EMS nitroglycerin to 7/10 intensity.  He is already taken aspirin prior to arrival, reports taking 5 baby aspirin's this morning when the pain started.   Past Medical History:  Diagnosis Date  . Arthritis   . Coronary artery disease   . COVID-19 12/2018  . Diabetes mellitus without complication ([place])   . Dyspnea    with exertion  . GERD (gastroesophageal reflux disease)    minimal episodes  . Headache(784.0)   . Heart disease   . History of blood transfusion 1990   internal injuries after a fall requiring blood  . History of colonic polyps   . Hyperglycemia   . Hyperlipidemia   . Hypertension   . Myocardial infarction ([place]) 2003  . PONV (postoperative nausea and vomiting)    one time only (surgery in 1990)    Patient Active Problem List   Diagnosis Date Noted  . Lumbar stenosis 10/30/2018    Past Surgical History:  Procedure Laterality Date  . ABDOMINAL SURGERY     patient unaware of this surgery  . BACK SURGERY    . CATARACT EXTRACTION W/PHACO Right 08/13/2015   Procedure: CATARACT EXTRACTION PHACO AND INTRAOCULAR LENS PLACEMENT (IOC);  Surgeon: Estill Cotta,  MD;  Location: [facility] ORS;  Service: Ophthalmology;  Laterality: Right;  [place] 01:08 AP% 24.9 CDE 32.40 fluid pack lot # [id number]  . CATARACT EXTRACTION W/PHACO Left 04/23/2020   Procedure: CATARACT EXTRACTION PHACO AND INTRAOCULAR LENS PLACEMENT (IOC)LEFT DIABETIC 14.14 02:15.2 10.5%;  Surgeon: Leandrew Koyanagi, MD;  Location: [place];  Service: Ophthalmology;  Laterality: Left;  Diabetic - oral meds  . COLONOSCOPY    . COLONOSCOPY WITH PROPOFOL N/A 08/24/2017   Procedure: COLONOSCOPY WITH PROPOFOL;  Surgeon: Manya Silvas, MD;  Location: [facility] ENDOSCOPY;  Service: Endoscopy;  Laterality: N/A;  . CORONARY ANGIOPLASTY     one stent placed  . EYE SURGERY     cataract surgery  . LUMBAR LAMINECTOMY/ DECOMPRESSION WITH MET-RX N/A 10/30/2018   Procedure: L4-5 LUMBAR LAMINECTOMY;  Surgeon: Deetta Perla, MD;  Location: [facility] ORS;  Service: Neurosurgery;  Laterality: N/A;  . LUMBAR LAMINECTOMY/DECOMPRESSION MICRODISCECTOMY Bilateral 10/23/2012   Procedure: LUMBAR LAMINECTOMY/DECOMPRESSION MICRODISCECTOMY 1 LEVEL;  Surgeon: Otilio Connors, MD;  Location: [place] NEURO ORS;  Service: Neurosurgery;  Laterality: Bilateral;  LUMBAR LAMINECTOMY/DECOMPRESSION MICRODISCECTOMY 1 LEVEL  . PENILE PROSTHESIS IMPLANT  1994   x3  . right shoulder arthroscopic    . SHOULDER SURGERY    . SUPRAPUBIC CATHETER PLACEMENT     and removal  . URETHERAL RE-IMPLANTATION      Prior to Admission medications   Medication Sig Start Date End Date Taking? Authorizing Provider  acetaminophen (TYLENOL)  650 MG CR tablet Take by mouth.    [provider]  amLODipine (NORVASC) 5 MG tablet Take 5 mg by mouth daily.    [provider]  aspirin EC 81 MG tablet Take 81 mg by mouth daily.    [provider]  cyclobenzaprine (FLEXERIL) 5 MG tablet Take 1 tablet (5 mg total) by mouth at bedtime. Patient not taking: Reported on 04/15/2020 04/01/20   Sable Feil, PA-C  glimepiride (AMARYL) 2 MG tablet  Take 2 mg by mouth daily with breakfast.    [provider]  Investigational - Study Medication Take 14 mg by mouth daily before breakfast. Study name: semaglutide 14 mg or placebo    [provider]  losartan (COZAAR) 100 MG tablet Take 100 mg by mouth daily.    [provider]  metFORMIN (GLUCOPHAGE) 500 MG tablet Take 1,000 mg by mouth 2 (two) times daily with a meal.    [provider]  nitroGLYCERIN (NITROSTAT) 0.4 MG SL tablet Place 0.4 mg under the tongue every 5 (five) minutes as needed for chest pain.    [provider]  Omega-3 Fatty Acids (FISH OIL PO) Take 2,000 mg by mouth 2 (two) times daily.    [provider]  valsartan-hydrochlorothiazide (DIOVAN-HCT) 160-12.5 MG tablet Take 1 tablet by mouth daily.    [provider]    Allergies Statins and Celebrex [celecoxib]  Family History  Problem Relation Age of Onset  . CAD Mother   . Heart attack Father     Social History Social History   Tobacco Use  . Smoking status: Former Smoker    Packs/day: 0.00    Years: 0.00    Pack years: 0.00  . 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 Vaping Use: Never used  Substance Use Topics  . Alcohol use: Yes    Alcohol/week: 5.0 standard drinks    Types: 5 Cans of beer per week    Comment: not every day but few times per week  . Drug use: No    Review of Systems  Constitutional: No fever/chills Eyes: No visual changes. ENT: No sore throat. Cardiovascular: Positive for chest pain. Respiratory: Denies shortness of breath. Gastrointestinal: No abdominal pain.  No nausea, no vomiting.  No diarrhea.  No constipation. Genitourinary: Negative for dysuria. Musculoskeletal: Negative for back pain. Skin: Negative for rash. Neurological: Negative for headaches, focal weakness or numbness. ____________________________________________   PHYSICAL EXAM:  VITAL SIGNS: Vitals:    06/12/20 0930 06/12/20 0941  BP: (!) 161/79 (!) 149/130  Pulse: 95 77  Resp: 16 19  Temp:    SpO2: 96% 97%    Constitutional: Alert and oriented. Well appearing and in no acute distress.  Sitting up in bed, making jokes with the nurses. Eyes: Conjunctivae are normal. PERRL. EOMI. Head: Atraumatic. Nose: No congestion/rhinnorhea. Mouth/Throat: Mucous membranes are moist.  Oropharynx non-erythematous. Neck: No stridor. No cervical spine tenderness to palpation. Cardiovascular: Normal rate, regular rhythm. Grossly normal heart sounds.  Good peripheral circulation. Strong and symmetric pulses to bilateral radial and DP pulses Respiratory: Normal respiratory effort.  No retractions. Lungs CTAB. Gastrointestinal: Soft , nondistended, nontender to palpation. No CVA tenderness. Musculoskeletal: No lower extremity tenderness nor edema.  No joint effusions. No signs of acute trauma. Neurologic:  Normal speech and language. No gross focal neurologic deficits are appreciated. No gait instability noted. Skin:  Skin is warm, dry  and intact. No rash noted. Psychiatric: Mood and affect are normal. Speech and behavior are normal. ____________________________________________   LABS (all labs ordered are listed, but only abnormal results are displayed)  Labs Reviewed  BASIC METABOLIC PANEL - Abnormal; Notable for the following components:      Result Value   Glucose, Bld 261 (*)    BUN 25 (*)    All other components within normal limits  CBC - Abnormal; Notable for the following components:   RBC 3.97 (*)    HCT 37.7 (*)    All other components within normal limits  TROPONIN I (HIGH SENSITIVITY) - Abnormal; Notable for the following components:   Troponin I (High Sensitivity) 66 (*)    All other components within normal limits  SARS CORONAVIRUS 2 (TAT 6-24 HRS)  RESP PANEL BY RT-PCR (FLU A&B, COVID) [id number]   ____________________________________________  12 Lead EKG  Sinus rhythm, rate of  74 bpm.  ST elevation to aVR with diffuse ST depressions concerning for left main STEMI. Repeat EKG is similar. ____________________________________________   PROCEDURES and INTERVENTIONS  Procedure(s) performed (including Critical Care):  .1-3 Lead EKG Interpretation Performed by: Vladimir Crofts, MD Authorized by: Vladimir Crofts, MD     Interpretation: normal     ECG rate:  72   ECG rate assessment: normal     Rhythm: sinus rhythm     Ectopy: PVCs     Conduction: normal   .Critical Care Performed by: Vladimir Crofts, MD Authorized by: Vladimir Crofts, MD   Critical care provider statement:    Critical care time (minutes):  30   Critical care was necessary to treat or prevent imminent or life-threatening deterioration of the following conditions:  Cardiac failure   Critical care was time spent personally by me on the following activities:  Discussions with consultants, evaluation of patient's response to treatment, examination of patient, ordering and performing treatments and interventions, ordering and review of laboratory studies, ordering and review of radiographic studies, pulse oximetry, re-evaluation of patient's condition, obtaining history from patient or surrogate and review of old charts    Medications  nitroGLYCERIN (NITROSTAT) 0.4 MG SL tablet (  Not Given 06/12/20 0940)  fentaNYL (SUBLIMAZE) injection 50 mcg (50 mcg Intravenous Given 06/12/20 0939)    ____________________________________________   MDM / ED COURSE   75 year old male presents to the ED with acute chest pain concerning for STEMI requiring immediate left heart cath.  Presents hypertensive, improving with nitroglycerin and fentanyl.  Exam without neurologic or vascular deficits.  He is well-appearing without distress, making jokes with the nurses.  Repeat EKG is similar, with ST elevations to aVR and diffuse ST depressions concerning for possible left main occlusion.  STEMI alert called and patient taken to the  Cath Lab for diagnostic LHC.  First troponin returning after he has left slightly elevated to 66.  At the request of cardiology, I will discuss the case with hospitalist for admission after catheterization.  Clinical Course as of 06/12/20 0814  Thu Jun 12, 2020  0938 Speak with Dr. Josefa Half and call a STEMI alert based off of the second EKG.  He is on the way.  Pain is now 7/10 intensity after Nitropaste, which remains on his chest.  Ordered fentanyl. [DS]  4818 Dr. Saralyn Pilar in the room to eval the patient. Going to cath lab now [DS]    Clinical Course User Index [DS] Vladimir Crofts, MD    ____________________________________________   FINAL CLINICAL IMPRESSION(S) / ED DIAGNOSES  Final diagnoses:  ST elevation myocardial infarction (STEMI) involving other coronary artery [facility])  Other chest pain     ED Discharge Orders    None       Wentworth Edelen   Note:  This document was prepared using Dragon voice recognition software and may include unintentional dictation errors.   Vladimir Crofts, MD 06/12/20 [phone]

## 2020-06-13 ENCOUNTER — Inpatient Hospital Stay
Admit: 2020-06-13 | Discharge: 2020-06-13 | Disposition: A | Discharge status: Home / Self Care | Payer: PPO | Attending: Cardiology | Admitting: Cardiology

## 2020-06-13 DIAGNOSIS — I2129 ST elevation (STEMI) myocardial infarction involving other sites: Secondary | ICD-10-CM

## 2020-06-13 DIAGNOSIS — I350 Nonrheumatic aortic (valve) stenosis: Secondary | ICD-10-CM

## 2020-06-13 DIAGNOSIS — E119 Type 2 diabetes mellitus without complications: Secondary | ICD-10-CM

## 2020-06-13 DIAGNOSIS — I10 Essential (primary) hypertension: Secondary | ICD-10-CM

## 2020-06-13 HISTORY — DX: Nonrheumatic aortic (valve) stenosis: I35.0

## 2020-06-13 LAB — BASIC METABOLIC PANEL
Anion gap: 8 (ref 5–15)
BUN: 19 mg/dL (ref 8–23)
CO2: 24 mmol/L (ref 22–32)
Calcium: 8.7 mg/dL — ABNORMAL LOW (ref 8.9–10.3)
Chloride: 103 mmol/L (ref 98–111)
Creatinine, Ser: 0.98 mg/dL (ref 0.61–1.24)
GFR, Estimated: >60 mL/min (ref >60)
Glucose, Bld: 189 mg/dL — ABNORMAL HIGH (ref 70–99)
Potassium: 4.2 mmol/L (ref 3.5–5.1)
Sodium: 135 mmol/L (ref 135–145)

## 2020-06-13 LAB — GLUCOSE, CAPILLARY
Glucose-Capillary: 183 mg/dL — ABNORMAL HIGH (ref 70–99)
Glucose-Capillary: 250 mg/dL — ABNORMAL HIGH (ref 70–99)
Glucose-Capillary: 252 mg/dL — ABNORMAL HIGH (ref 70–99)
Glucose-Capillary: 254 mg/dL — ABNORMAL HIGH (ref 70–99)

## 2020-06-13 LAB — CBC
HCT: 34.5 % — ABNORMAL LOW (ref 39.0–52.0)
Hemoglobin: 11.9 g/dL — ABNORMAL LOW (ref 13.0–17.0)
MCH: 32.8 pg (ref 26.0–34.0)
MCHC: 34.5 g/dL (ref 30.0–36.0)
MCV: 95 fL (ref 80.0–100.0)
Platelets: 155 10*3/uL (ref 150–400)
RBC: 3.63 MIL/uL — ABNORMAL LOW (ref 4.22–5.81)
RDW: 13.5 % (ref 11.5–15.5)
WBC: 8.2 10*3/uL (ref 4.0–10.5)
nRBC: 0 % (ref 0.0–0.2)

## 2020-06-13 LAB — ECHOCARDIOGRAM COMPLETE
AR max vel: 1.94 cm2
AV Area VTI: 1.94 cm2
AV Area mean vel: 2.25 cm2
AV Mean grad: 5 mmHg
AV Peak grad: 10.2 mmHg
Ao pk vel: 1.6 m/s
Area-P 1/2: 4.17 cm2
Height: 68 in
MV VTI: 2.65 cm2
S' Lateral: 3.9 cm
Weight: 3467.39 oz

## 2020-06-13 LAB — HEMOGLOBIN A1C
Hgb A1c MFr Bld: 7.2 % — ABNORMAL HIGH (ref 4.8–5.6)
Mean Plasma Glucose: 160 mg/dL

## 2020-06-13 LAB — MAGNESIUM: Magnesium: 1.7 mg/dL (ref 1.7–2.4)

## 2020-06-13 MED ORDER — EZETIMIBE 10 MG PO TABS
10.0000 mg | ORAL_TABLET | Freq: Every day | ORAL | Status: DC
  Administered 2020-06-13 – 2020-06-14 (×2): 10 mg via ORAL
  Filled 2020-06-13 (×2): qty 1

## 2020-06-13 MED ORDER — CHLORHEXIDINE GLUCONATE CLOTH 2 % EX PADS
6.0000 | MEDICATED_PAD | Freq: Every day | CUTANEOUS | Status: DC
  Administered 2020-06-13 – 2020-06-14 (×2): 6 via TOPICAL

## 2020-06-13 MED ORDER — NAPHAZOLINE-GLYCERIN 0.012-0.25 % OP SOLN
1.0000 [drp] | Freq: Four times a day (QID) | OPHTHALMIC | Status: DC | PRN
  Filled 2020-06-13: qty 15

## 2020-06-13 MED ORDER — PERFLUTREN LIPID MICROSPHERE
1.0000 mL | INTRAVENOUS | Status: AC | PRN
  Administered 2020-06-13: 2 mL via INTRAVENOUS
  Filled 2020-06-13: qty 10

## 2020-06-13 MED ORDER — METOPROLOL SUCCINATE ER 50 MG PO TB24
50.0000 mg | ORAL_TABLET | Freq: Every day | ORAL | Status: DC
  Administered 2020-06-13 – 2020-06-14 (×2): 50 mg via ORAL
  Filled 2020-06-13 (×2): qty 1

## 2020-06-13 MED ORDER — INSULIN GLARGINE 100 UNIT/ML ~~LOC~~ SOLN
10.0000 [IU] | Freq: Every day | SUBCUTANEOUS | Status: DC
  Administered 2020-06-13: 10 [IU] via SUBCUTANEOUS
  Filled 2020-06-13 (×2): qty 0.1

## 2020-06-13 NOTE — Progress Notes (Signed)
[facility] Cardiology  SUBJECTIVE: Patient denies chest pain or shortness of breath   Vitals:   06/13/20 0200 06/13/20 0300 06/13/20 0400 06/13/20 0500  BP: (!) 127/51 (!) 133/52 (!) 125/55 132/60  Pulse: 66 68 67 71  Resp: 18 19 19 19   Temp:   99 F (37.2 C)   TempSrc:   Oral   SpO2: 97% 96% 97% 95%  Weight:      Height:         Intake/Output Summary (Last 24 hours) at 06/13/2020 4696 Last data filed at 06/13/2020 0500 Gross per 24 hour  Intake --  Output 1600 ml  Net -1600 ml      PHYSICAL EXAM  General: Well developed, well nourished, in no acute distress HEENT:  Normocephalic and atramatic Neck:  No JVD.  Lungs: Clear bilaterally to auscultation and percussion. Heart: HRRR . Normal S1 and S2 without gallops or murmurs.  Abdomen: Bowel sounds are positive, abdomen soft and non-tender  Msk:  Back normal, normal gait. Normal strength and tone for age. Extremities: No clubbing, cyanosis or edema.   Neuro: Alert and oriented X 3. Psych:  Good affect, responds appropriately   LABS: Basic Metabolic Panel: Recent Labs    06/12/20 0916 06/13/20 0544  NA 135 135  K 4.1 4.2  CL 101 103  CO2 22 24  GLUCOSE 261* 189*  BUN 25* 19  CREATININE 1.23 0.98  CALCIUM 9.4 8.7*   Liver Function Tests: No results for input(s): AST, ALT, ALKPHOS, BILITOT, PROT, ALBUMIN in the last 72 hours. No results for input(s): LIPASE, AMYLASE in the last 72 hours. CBC: Recent Labs    06/12/20 0916 06/13/20 0544  WBC 9.7 8.2  HGB 13.2 11.9*  HCT 37.7* 34.5*  MCV 95.0 95.0  PLT 177 155   Cardiac Enzymes: No results for input(s): CKTOTAL, CKMB, CKMBINDEX, TROPONINI in the last 72 hours. BNP: Invalid input(s): POCBNP D-Dimer: No results for input(s): DDIMER in the last 72 hours. Hemoglobin A1C: Recent Labs    06/12/20 0916  HGBA1C 7.2*   Fasting Lipid Panel: No results for input(s): CHOL, HDL, LDLCALC, TRIG, CHOLHDL, LDLDIRECT in the last 72 hours. Thyroid Function Tests: No  results for input(s): TSH, T4TOTAL, T3FREE, THYROIDAB in the last 72 hours.  Invalid input(s): FREET3 Anemia Panel: No results for input(s): VITAMINB12, FOLATE, FERRITIN, TIBC, IRON, RETICCTPCT in the last 72 hours.  CARDIAC CATHETERIZATION  Result Date: 06/12/2020  Colon [place] RCA lesion is 100% stenosed.  Ost LAD to Prox LAD lesion is 95% stenosed.  2nd LPL lesion is 70% stenosed.  3rd LPL lesion is 70% stenosed.  Prox Cx lesion is 30% stenosed.  Mid LAD lesion is 50% stenosed.  A drug-eluting stent was successfully placed using a STENT RESOLUTE ONYX [id number].  Post intervention, there is a 0% residual stenosis.  1.  Vessel coronary artery disease with chronically occluded proximal RCA and 95% stenosis proximal LAD, with 70% stenosis LPL 2 and LPL 3 2.  Mildly reduced left ventricular function with estimated LV ejection fraction 45 to 50% 3.  Successful PCI with DES proximal LAD Recommendations 1.  Dual antiplatelet therapy uninterrupted for 1 year 2.  Aggressive risk factor modification     Echo pending  TELEMETRY: Sinus rhythm with ST elevation in aVR and lead III, with ST depressions in leads I, aVL, V4 through V6:  ASSESSMENT AND PLAN:  Principal Problem:   Acute ST elevation myocardial infarction (STEMI) of lateral wall [facility]) Active Problems:   Coronary  artery disease   Hypertension   Diabetes mellitus without complication ([place])    1.  Possible lateral STEMI versus non-ST elevation myocardial infarction, with chronically occluded RCA with left-to-right collaterals, high-grade stenosis proximal LAD with successful PCI with DES proximal LAD.  Left ventriculography reveals mild reduced left ventricular function.  2D echocardiogram pending. 2.  Essential hypertension.  Blood pressure well controlled on current BP medications 3.  Hyperlipidemia, with history of statin intolerance to rosuvastatin, atorvastatin, and simvastatin  Recommendations  1.  Dual antiplatelet therapy  uninterrupted for 1 year 2.  Resume Zetia 10 mg daily 3.  Resume metoprolol succinate 50 mg daily 4.  Review 2D echocardiogram 5.  May transfer to telemetry   Isaias Cowman, MD, PhD, [facility] 06/13/2020 8:07 AM

## 2020-06-13 NOTE — Progress Notes (Signed)
PROGRESS NOTE    Joshua Ferrell.  YBO:[id number] DOB: May 03, 1945 DOA: 06/12/2020 PCP: Kirk Ruths, MD    Brief Narrative:  75 y/o male with history of DM, HTN, CAD, admitted to the hospital with chest pain. Found to have STEMI and urgently went to cath lab were PCI with DES performed on LAD.    Assessment & Plan:   Principal Problem:   Acute ST elevation myocardial infarction (STEMI) of lateral wall (HCC) Active Problems:   Coronary artery disease   Hypertension   Diabetes mellitus without complication (HCC)   STEMI -s/p cath with PCI with DES proximal LAD -continue on dual antiplatelet therapy -intolerant of statins, on zetia -he is on toprol and avapro  Ischemic cardiomyopathy -EF 45-50% on echo -continue BB, ARB -will need follow up echo in next few months  DM2  -chronically on glimepiride/metformin, currently held -on SSI -Blood sugars stable  HTN -continue on metoprolol and ARB -BP stable   DVT prophylaxis: lovenox  Code Status: full code Family Communication: discussed with patient Disposition Plan: Status is: Inpatient  Remains inpatient appropriate because:Inpatient level of care appropriate due to severity of illness   Dispo: The patient is from: Home              Anticipated d/c is to: Home              Patient currently is not medically stable to d/c.   Difficult to place patient No    Consultants:   cardiology  Procedures:   Cardiac cath 5/26 with PCI  Antimicrobials:       Subjective: No chest pain or shortness of breath  Objective: Vitals:   06/13/20 0500 06/13/20 0700 06/13/20 0701 06/13/20 1535  BP: 132/60 (!) 151/65 (!) 151/65 129/67  Pulse: 71 70  77  Resp: 19 19  19   Temp:    99 F (37.2 C)  TempSrc:    Oral  SpO2: 95% 96%  97%  Weight:      Height:        Intake/Output Summary (Last 24 hours) at 06/13/2020 1546 Last data filed at 06/13/2020 0500 Gross per 24 hour  Intake --  Output 1150 ml   Net -1150 ml   Filed Weights   06/12/20 0918 06/12/20 1200  Weight: 98.4 kg 98.3 kg    Examination:  General exam: Appears calm and comfortable  Respiratory system: Clear to auscultation. Respiratory effort normal. Cardiovascular system: S1 & S2 heard, RRR. No JVD, murmurs, rubs, gallops or clicks. No pedal edema. Gastrointestinal system: Abdomen is nondistended, soft and nontender. No organomegaly or masses felt. Normal bowel sounds heard. Central nervous system: Alert and oriented. No focal neurological deficits. Extremities: Symmetric 5 x 5 power. Skin: No rashes, lesions or ulcers Psychiatry: Judgement and insight appear normal. Mood & affect appropriate.     Data Reviewed: I have personally reviewed following labs and imaging studies  CBC: Recent Labs  Lab 06/12/20 0916 06/13/20 0544  WBC 9.7 8.2  HGB 13.2 11.9*  HCT 37.7* 34.5*  MCV 95.0 95.0  PLT 177 277   Basic Metabolic Panel: Recent Labs  Lab 06/12/20 0916 06/13/20 0544  NA 135 135  K 4.1 4.2  CL 101 103  CO2 22 24  GLUCOSE 261* 189*  BUN 25* 19  CREATININE 1.23 0.98  CALCIUM 9.4 8.7*   GFR: Estimated Creatinine Clearance: 71.8 mL/min (by C-G formula based on SCr of 0.98 mg/dL). Liver Function Tests: No results for  input(s): AST, ALT, ALKPHOS, BILITOT, PROT, ALBUMIN in the last 168 hours. No results for input(s): LIPASE, AMYLASE in the last 168 hours. No results for input(s): AMMONIA in the last 168 hours. Coagulation Profile: No results for input(s): INR, PROTIME in the last 168 hours. Cardiac Enzymes: No results for input(s): CKTOTAL, CKMB, CKMBINDEX, TROPONINI in the last 168 hours. BNP (last 3 results) No results for input(s): PROBNP in the last 8760 hours. HbA1C: Recent Labs    06/12/20 0916  HGBA1C 7.2*   CBG: Recent Labs  Lab 06/12/20 1202 06/12/20 1641 06/12/20 2202 06/13/20 0807 06/13/20 1114  GLUCAP 202* 157* 202* 183* 250*   Lipid Profile: No results for input(s):  CHOL, HDL, LDLCALC, TRIG, CHOLHDL, LDLDIRECT in the last 72 hours. Thyroid Function Tests: No results for input(s): TSH, T4TOTAL, FREET4, T3FREE, THYROIDAB in the last 72 hours. Anemia Panel: No results for input(s): VITAMINB12, FOLATE, FERRITIN, TIBC, IRON, RETICCTPCT in the last 72 hours. Sepsis Labs: No results for input(s): PROCALCITON, LATICACIDVEN in the last 168 hours.  Recent Results (from the past 240 hour(s))  SARS CORONAVIRUS 2 (TAT 6-24 HRS) Nasopharyngeal Nasopharyngeal Swab     Status: None   Collection Time: 06/12/20  9:34 AM   Specimen: Nasopharyngeal Swab  Result Value Ref Range Status   SARS Coronavirus 2 NEGATIVE NEGATIVE Final    Comment: (NOTE) SARS-CoV-2 target nucleic acids are NOT DETECTED.  The SARS-CoV-2 RNA is generally detectable in upper and lower respiratory specimens during the acute phase of infection. Negative results do not preclude SARS-CoV-2 infection, do not rule out co-infections with other pathogens, and should not be used as the sole basis for treatment or other patient management decisions. Negative results must be combined with clinical observations, patient history, and epidemiological information. The expected result is Negative.  Fact Sheet for Patients: SugarRoll.be  Fact Sheet for Healthcare Providers: https://www.woods-mathews.com/  This test is not yet approved or cleared by the [place] FDA and  has been authorized for detection and/or diagnosis of SARS-CoV-2 by FDA under an Emergency Use Authorization (EUA). This EUA will remain  in effect (meaning this test can be used) for the duration of the COVID-19 declaration under Se ction 564(b)(1) of the Act, 21 U.S.C. section 360bbb-3(b)(1), unless the authorization is terminated or revoked sooner.  Performed at [facility], [place] [address], [place], [place] [address]   Resp Panel by RT-PCR (Flu A&B, Covid) Nasopharyngeal Swab      Status: None   Collection Time: 06/12/20  9:39 AM   Specimen: Nasopharyngeal Swab; Nasopharyngeal(NP) swabs in vial transport medium  Result Value Ref Range Status   SARS Coronavirus 2 by RT PCR NEGATIVE NEGATIVE Final    Comment: (NOTE) SARS-CoV-2 target nucleic acids are NOT DETECTED.  The SARS-CoV-2 RNA is generally detectable in upper respiratory specimens during the acute phase of infection. The lowest concentration of SARS-CoV-2 viral copies this assay can detect is 138 copies/mL. A negative result does not preclude SARS-Cov-2 infection and should not be used as the sole basis for treatment or other patient management decisions. A negative result may occur with  improper specimen collection/handling, submission of specimen other than nasopharyngeal swab, presence of viral mutation(s) within the areas targeted by this assay, and inadequate number of viral copies(<138 copies/mL). A negative result must be combined with clinical observations, patient history, and epidemiological information. The expected result is Negative.  Fact Sheet for Patients:  EntrepreneurPulse.com.au  Fact Sheet for Healthcare Providers:  IncredibleEmployment.be  This test  is no t yet approved or cleared by the [place] and  has been authorized for detection and/or diagnosis of SARS-CoV-2 by FDA under an Emergency Use Authorization (EUA). This EUA will remain  in effect (meaning this test can be used) for the duration of the COVID-19 declaration under Section 564(b)(1) of the Act, 21 U.S.C.section 360bbb-3(b)(1), unless the authorization is terminated  or revoked sooner.       Influenza A by PCR NEGATIVE NEGATIVE Final   Influenza B by PCR NEGATIVE NEGATIVE Final    Comment: (NOTE) The Xpert Xpress SARS-CoV-2/FLU/RSV plus assay is intended as an aid in the diagnosis of influenza from Nasopharyngeal swab specimens and should not be used as a sole basis  for treatment. Nasal washings and aspirates are unacceptable for Xpert Xpress SARS-CoV-2/FLU/RSV testing.  Fact Sheet for Patients: EntrepreneurPulse.com.au  Fact Sheet for Healthcare Providers: IncredibleEmployment.be  This test is not yet approved or cleared by the [place] FDA and has been authorized for detection and/or diagnosis of SARS-CoV-2 by FDA under an Emergency Use Authorization (EUA). This EUA will remain in effect (meaning this test can be used) for the duration of the COVID-19 declaration under Section 564(b)(1) of the Act, 21 U.S.C. section 360bbb-3(b)(1), unless the authorization is terminated or revoked.  Performed at [facility], [address], [place], [place] [address]          Radiology Studies: CARDIAC CATHETERIZATION  Result Date: 06/12/2020  Colon Flattery RCA lesion is 100% stenosed.  Ost LAD to Prox LAD lesion is 95% stenosed.  2nd LPL lesion is 70% stenosed.  3rd LPL lesion is 70% stenosed.  Prox Cx lesion is 30% stenosed.  Mid LAD lesion is 50% stenosed.  A drug-eluting stent was successfully placed using a STENT RESOLUTE ONYX [id number].  Post intervention, there is a 0% residual stenosis.  1.  Vessel coronary artery disease with chronically occluded proximal RCA and 95% stenosis proximal LAD, with 70% stenosis LPL 2 and LPL 3 2.  Mildly reduced left ventricular function with estimated LV ejection fraction 45 to 50% 3.  Successful PCI with DES proximal LAD Recommendations 1.  Dual antiplatelet therapy uninterrupted for 1 year 2.  Aggressive risk factor modification   ECHOCARDIOGRAM COMPLETE  Result Date: 06/13/2020    ECHOCARDIOGRAM REPORT   Patient Name:   Joshua Ferrell. Date of Exam: 06/13/2020 Medical Rec #:  [id number]               Height:       68.0 in Accession #:    [id number]              Weight:       216.7 lb Date of Birth:  04-04-1945               BSA:          2.115 m Patient Age:    1  years                BP:           132/60 mmHg Patient Gender: M                       HR:           76 bpm. Exam Location:  [facility] Procedure: 2D Echo, Color Doppler, Cardiac Doppler and Intracardiac            Opacification Agent Indications:  I21.9 Acute myocardial infarction  History:         Patient has no prior history of Echocardiogram examinations.                  Previous Myocardial Infarction and CAD; Risk                  Factors:Hypertension, Diabetes and Dyslipidemia.  Sonographer:     Charmayne Sheer RDCS (AE) Referring Phys:  [place] Diagnosing Phys: Isaias Cowman MD  Sonographer Comments: Technically difficult study due to poor echo windows. Image acquisition challenging due to patient body habitus. IMPRESSIONS  1. Left ventricular ejection fraction, by estimation, is 45 to 50%. The left ventricle has mildly decreased function. The left ventricle has no regional wall motion abnormalities. Left ventricular diastolic parameters are consistent with Grade I diastolic dysfunction (impaired relaxation).  2. Right ventricular systolic function is normal. The right ventricular size is normal.  3. The mitral valve is normal in structure. Mild mitral valve regurgitation. No evidence of mitral stenosis.  4. The aortic valve is normal in structure. Aortic valve regurgitation is not visualized. Mild aortic valve stenosis.  5. The inferior vena cava is normal in size with greater than 50% respiratory variability, suggesting right atrial pressure of 3 mmHg. FINDINGS  Left Ventricle: Left ventricular ejection fraction, by estimation, is 45 to 50%. The left ventricle has mildly decreased function. The left ventricle has no regional wall motion abnormalities. Definity contrast agent was given IV to delineate the left ventricular endocardial borders. The left ventricular internal cavity size was normal in size. There is no left ventricular hypertrophy. Left ventricular diastolic parameters are  consistent with Grade I diastolic dysfunction (impaired relaxation). Right Ventricle: The right ventricular size is normal. No increase in right ventricular wall thickness. Right ventricular systolic function is normal. Left Atrium: Left atrial size was normal in size. Right Atrium: Right atrial size was normal in size. Pericardium: There is no evidence of pericardial effusion. Mitral Valve: The mitral valve is normal in structure. Mild mitral valve regurgitation. No evidence of mitral valve stenosis. MV peak gradient, 3.0 mmHg. The mean mitral valve gradient is 1.0 mmHg. Tricuspid Valve: The tricuspid valve is normal in structure. Tricuspid valve regurgitation is mild . No evidence of tricuspid stenosis. Aortic Valve: The aortic valve is normal in structure. Aortic valve regurgitation is not visualized. Mild aortic stenosis is present. Aortic valve mean gradient measures 5.0 mmHg. Aortic valve peak gradient measures 10.2 mmHg. Aortic valve area, by VTI measures 1.94 cm. Pulmonic Valve: The pulmonic valve was normal in structure. Pulmonic valve regurgitation is not visualized. No evidence of pulmonic stenosis. Aorta: The aortic root is normal in size and structure. Venous: The inferior vena cava is normal in size with greater than 50% respiratory variability, suggesting right atrial pressure of 3 mmHg. IAS/Shunts: No atrial level shunt detected by color flow Doppler.  LEFT VENTRICLE PLAX 2D LVIDd:         5.00 cm  Diastology LVIDs:         3.90 cm  LV e' medial:    5.00 cm/s LV PW:         1.10 cm  LV E/e' medial:  12.6 LV IVS:        0.90 cm  LV e' lateral:   8.05 cm/s LVOT diam:     2.10 cm  LV E/e' lateral: 7.8 LV SV:         55 LV SV Index:  26 LVOT Area:     3.46 cm  RIGHT VENTRICLE RV Basal diam:  3.60 cm LEFT ATRIUM             Index       RIGHT ATRIUM           Index LA diam:        3.70 cm 1.75 cm/m  RA Area:     12.70 cm LA Vol (A2C):   43.3 ml 20.48 ml/m RA Volume:   32.50 ml  15.37 ml/m LA Vol  (A4C):   48.8 ml 23.08 ml/m LA Biplane Vol: 47.4 ml 22.42 ml/m  AORTIC VALVE                    PULMONIC VALVE AV Area (Vmax):    1.94 cm     PV Vmax:       1.08 m/s AV Area (Vmean):   2.25 cm     PV Vmean:      73.600 cm/s AV Area (VTI):     1.94 cm     PV VTI:        0.185 m AV Vmax:           160.00 cm/s  PV Peak grad:  4.7 mmHg AV Vmean:          106.000 cm/s PV Mean grad:  2.0 mmHg AV VTI:            0.285 m AV Peak Grad:      10.2 mmHg AV Mean Grad:      5.0 mmHg LVOT Vmax:         89.40 cm/s LVOT Vmean:        69.000 cm/s LVOT VTI:          0.160 m LVOT/AV VTI ratio: 0.56  AORTA Ao Root diam: 3.50 cm MITRAL VALVE MV Area (PHT): 4.17 cm    SHUNTS MV Area VTI:   2.65 cm    Systemic VTI:  0.16 m MV Peak grad:  3.0 mmHg    Systemic Diam: 2.10 cm MV Mean grad:  1.0 mmHg MV Vmax:       0.86 m/s MV Vmean:      46.6 cm/s MV Decel Time: 182 msec MV E velocity: 63.00 cm/s MV A velocity: 81.80 cm/s MV E/A ratio:  0.77 Isaias Cowman MD Electronically signed by Isaias Cowman MD Signature Date/Time: 06/13/2020/8:22:25 AM    Final         Scheduled Meds: . amLODipine  5 mg Oral Daily  . aspirin  81 mg Oral Daily  . Chlorhexidine Gluconate Cloth  6 each Topical Daily  . clopidogrel  75 mg Oral Q breakfast  . enoxaparin (LOVENOX) injection  50 mg Subcutaneous Q24H  . ezetimibe  10 mg Oral Daily  . hydrochlorothiazide  12.5 mg Oral Daily  . insulin aspart  0-15 Units Subcutaneous TID WC  . irbesartan  150 mg Oral Daily  . metoprolol succinate  50 mg Oral Daily  . omega-3 acid ethyl esters  2 g Oral BID  . sodium chloride flush  3 mL Intravenous Q12H   Continuous Infusions: . sodium chloride       LOS: 1 day    Time spent: 75mins    Kathie Dike, MD [facility]   If 7PM-7AM, please contact night-coverage [email or web address]  06/13/2020, 3:46 PM

## 2020-06-13 NOTE — Progress Notes (Signed)
*  PRELIMINARY RESULTS* Echocardiogram 2D Echocardiogram has been performed.  Joshua Ferrell 06/13/2020, 7:59 AM

## 2020-06-13 NOTE — Plan of Care (Signed)

## 2020-06-14 DIAGNOSIS — R0789 Other chest pain: Secondary | ICD-10-CM

## 2020-06-14 LAB — GLUCOSE, CAPILLARY: Glucose-Capillary: 212 mg/dL — ABNORMAL HIGH (ref 70–99)

## 2020-06-14 MED ORDER — METOPROLOL SUCCINATE ER 50 MG PO TB24: 50.0000 mg | ORAL_TABLET | Freq: Every day | ORAL | 1 refills | Status: DC

## 2020-06-14 MED ORDER — CLOPIDOGREL BISULFATE 75 MG PO TABS
75.0000 mg | ORAL_TABLET | Freq: Every day | ORAL | 1 refills | Status: DC
Start: 2020-06-15 — End: 2021-08-06

## 2020-06-14 MED ORDER — EZETIMIBE 10 MG PO TABS: 10.0000 mg | ORAL_TABLET | Freq: Every day | ORAL | 1 refills | Status: DC

## 2020-06-14 NOTE — Progress Notes (Signed)
Patient discharged at this time to home with all belongings. PIV/Tele removed by Probation officer. Patient verbalized understanding of discharge instructions per printed AVS. NAD noted upon departure.

## 2020-06-14 NOTE — Discharge Instructions (Signed)
Heart Attack The heart is a muscle that needs oxygen to survive. A heart attack is a condition that occurs when your heart does not get enough oxygen. When this happens, the heart muscle begins to die. This can cause permanent damage if not treated right away. A heart attack is a medical emergency. This condition may be called a myocardial infarction, or MI. It is also known as acute coronary syndrome (ACS). ACS is a term used to describe a group of conditions that affect blood flow to the heart. What are the causes? This condition may be caused by:  Atherosclerosis. This occurs when a fatty substance called plaque builds up in the arteries and blocks or reduces blood supply to the heart.  A blood clot. A blood clot can develop suddenly when plaque breaks up within an artery and blocks blood flow to the heart.  Low blood pressure.  An abnormal heartbeat (arrhythmia).  Conditions that cause a decrease of oxygen to the heart, such as anemiaorrespiratory failure.  A spasm, or severe tightening, of a blood vessel that cuts off blood flow to the heart.  Tearing of a coronary artery (spontaneous coronary artery dissection).  High blood pressure.   What increases the risk? The following factors may make you more likely to develop this condition:  Aging. The older you are, the higher your risk.  Having a personal or family history of chest pain, heart attack, stroke, or narrowing of the arteries in the legs, arms, head, or stomach (peripheral artery disease).  Being male.  Smoking.  Not getting regular exercise.  Being overweight or obese.  Having high blood pressure.  Having high cholesterol (hypercholesterolemia).  Having diabetes.  Drinking too much alcohol.  Using illegal drugs, such as cocaine or methamphetamine. What are the signs or symptoms? Symptoms of this condition may vary, depending on factors like gender and age. Symptoms may include:  Chest pain. It may feel  like: ? Crushing or squeezing. ? Tightness, pressure, fullness, or heaviness.  Pain in the arm, neck, jaw, back, or upper body.  Shortness of breath.  Heartburn or upset stomach.  Nausea.  Sudden cold sweats.  Feeling tired.  Sudden light-headedness. How is this diagnosed? This condition may be diagnosed through tests, such as:  Electrocardiogram (ECG) to measure the electrical activity of your heart.  Blood tests to check for cardiac markers. These chemicals are released by a damaged heart muscle.  A test to evaluate blood flow and heart function (coronary angiogram).  CT scan to see the heart more clearly.  A test to evaluate the pumping action of the heart (echocardiogram). How is this treated? A heart attack must be treated as soon as possible. Treatment may include:  Medicines to: ? Break up or dissolve blood clots (fibrinolytic therapy). ? Thin blood and help prevent blood clots. ? Treat blood pressure. ? Improve blood flow to the heart. ? Reduce pain. ? Reduce cholesterol.  Angioplasty and stent placement. These are procedures to widen a blocked artery and keep it open.  Coronary artery bypass graft, CABG, or open heart surgery. This enables blood to flow to the heart by going around the blocked part of the artery.  Oxygen therapy if needed.  Cardiac rehabilitation. This improves your health and well-being through exercise, education, and counseling.   Follow these instructions at home: Medicines  Take over-the-counter and prescription medicines only as told by your health care provider.  Do not take the following medicines unless your health care provider  says it is okay to take them: ? NSAIDs, such as ibuprofen. ? Supplements that contain vitamin A, vitamin E, or both. ? Hormone replacement therapy that contains estrogen with or without progestin. Lifestyle  Do not use any products that contain nicotine or tobacco, such as cigarettes, e-cigarettes,  and chewing tobacco. If you need help quitting, ask your health care provider.  Avoid secondhand smoke.  Exercise regularly. Ask your health care provider about participating in a cardiac rehabilitation program that helps you start exercising safely after a heart attack.  Eat a heart-healthy diet. Your health care provider will tell you what foods to eat.  Maintain a healthy weight.  Learn ways to manage stress.  Do not use illegal drugs.   Alcohol use  Do not drink alcohol if: ? Your health care provider tells you not to drink. ? You are pregnant, may be pregnant, or are planning to become pregnant.  If you drink alcohol: ? Limit how much you use to:  0-1 drink a day for women.  0-2 drinks a day for men. ? Be aware of how much alcohol is in your drink. In the [place] one drink equals one 12 oz bottle of beer (355 mL), one 5 oz glass of wine (148 mL), or one 1 oz glass of hard liquor (44 mL). General instructions  Work with your health care provider to manage any other conditions you have, such as high blood pressure or diabetes. These conditions affect your heart.  Get screened for depression, and seek treatment if needed.  Keep your vaccinations up to date. Get the flu vaccine every year.  Keep all follow-up visits as told by your health care provider. This is important. Contact a health care provider if:  You feel overwhelmed or sad.  You have trouble doing your daily activities. Get help right away if:  You have sudden, unexplained discomfort in your chest, arms, back, neck, jaw, or upper body.  You have shortness of breath.  You suddenly start to sweat or your skin gets clammy.  You feel nauseous or you vomit.  You have unexplained tiredness or weakness.  You suddenly feel light-headed or dizzy.  You notice your heart starts to beat fast or feels like it is skipping beats.  You have blood pressure that is higher than 180/120. These symptoms may represent a  serious problem that is an emergency. Do not wait to see if the symptoms will go away. Get medical help right away. Call your local emergency services (911 in the [place] Do not drive yourself to the hospital. Summary  A heart attack, also called myocardial infarction, is a condition that occurs when your heart does not get enough oxygen. This is caused by anything that blocks or reduces blood flow to the heart.  Treatment is a combination of medicines and surgeries, if needed, to open the blocked arteries and restore blood flow to the heart.  A heart attack is an emergency. Get help right away if you have sudden discomfort in your chest, arms, back, neck, jaw, or upper body. Seek help if you feel nauseous, you vomit, or you feel light-headed or dizzy. This information is not intended to replace advice given to you by your health care provider. Make sure you discuss any questions you have with your health care provider. Document Revised: 04/13/2018 Document Reviewed: 04/17/2018 Elsevier Patient Education  [place].

## 2020-06-14 NOTE — Discharge Summary (Signed)
Physician Discharge Summary  Joshua Ferrell. WER:[id number] DOB: 21-Jul-1945 DOA: 06/12/2020  PCP: Kirk Ruths, MD  Admit date: 06/12/2020 Discharge date: 06/14/2020  Admitted From: Home Disposition: Home  Recommendations for Outpatient Follow-up:  1. Follow up with PCP in 1-2 weeks 2. Please obtain BMP/CBC in one week 3. Follow-up with cardiology in 1 week  Home Health: Equipment/Devices:  Discharge Condition: Stable CODE STATUS: Full code Diet recommendation: Heart healthy, carb   Brief/Interim Summary: 75 y/o male with history of DM, HTN, CAD, admitted to the hospital with chest pain. Found to have STEMI and urgently went to cath lab were PCI with DES performed on LAD.   Discharge Diagnoses:  Principal Problem:   Acute ST elevation myocardial infarction (STEMI) of lateral wall (HCC) Active Problems:   Coronary artery disease   Hypertension   Diabetes mellitus without complication (HCC)   STEMI -s/p cath with PCI with DES proximal LAD -continue on dual antiplatelet therapy -intolerant of statins, on zetia -he is on toprol and avapro  Ischemic cardiomyopathy -EF 45-50% on echo -continue BB, ARB -will need follow up echo in next few months  DM2  -chronically on glimepiride/metformin, currently held -on SSI -Resume oral meds on discharge  HTN -continue on metoprolol and ARB -BP stable  Discharge Instructions  Discharge Instructions    AMB Referral to Cardiac Rehabilitation - Phase II   Complete by: As directed    Diagnosis: STEMI   After initial evaluation and assessments completed: Virtual Based Care may be provided alone or in conjunction with Phase 2 Cardiac Rehab based on patient barriers.: Yes   Diet - low sodium heart healthy   Complete by: As directed    Increase activity slowly   Complete by: As directed      Allergies as of 06/14/2020      Reactions   Statins    Muscle pain   Celebrex [celecoxib] Itching      Medication  List    STOP taking these medications   cyclobenzaprine 5 MG tablet Commonly known as: FLEXERIL   FISH OIL PO   losartan 100 MG tablet Commonly known as: COZAAR     TAKE these medications   acetaminophen 650 MG CR tablet Commonly known as: TYLENOL Take by mouth.   amLODipine 5 MG tablet Commonly known as: NORVASC Take 5 mg by mouth daily.   aspirin EC 81 MG tablet Take 81 mg by mouth daily.   clopidogrel 75 MG tablet Commonly known as: PLAVIX Take 1 tablet (75 mg total) by mouth daily with breakfast. Start taking on: Jun 15, 2020   ezetimibe 10 MG tablet Commonly known as: ZETIA Take 1 tablet (10 mg total) by mouth daily. Start taking on: Jun 15, 2020   glimepiride 2 MG tablet Commonly known as: AMARYL Take 2 mg by mouth daily with breakfast.   Investigational - Study Medication Take 14 mg by mouth daily before breakfast. Study name: semaglutide 14 mg or placebo   metFORMIN 500 MG tablet Commonly known as: GLUCOPHAGE Take 1,000 mg by mouth 2 (two) times daily with a meal.   metoprolol succinate 50 MG 24 hr tablet Commonly known as: TOPROL-XL Take 1 tablet (50 mg total) by mouth daily. Take with or immediately following a meal. Start taking on: Jun 15, 2020   nitroGLYCERIN 0.4 MG SL tablet Commonly known as: NITROSTAT Place 0.4 mg under the tongue every 5 (five) minutes as needed for chest pain.   valsartan-hydrochlorothiazide 160-12.5 MG tablet  Commonly known as: DIOVAN-HCT Take 1 tablet by mouth daily.       Follow-up Information    Paraschos, Sheppard Coil, MD Follow up.   Specialty: Cardiology Why: call for appointment in 1 week Contact information: [facility] [place] [address] [phone]              Allergies  Allergen Reactions  . Statins     Muscle pain   . Celebrex [Celecoxib] Itching    Consultations:  Cardiology   Procedures/Studies: CARDIAC CATHETERIZATION  Result Date:  06/12/2020  Ost RCA lesion is 100% stenosed.  Ost LAD to Prox LAD lesion is 95% stenosed.  2nd LPL lesion is 70% stenosed.  3rd LPL lesion is 70% stenosed.  Prox Cx lesion is 30% stenosed.  Mid LAD lesion is 50% stenosed.  A drug-eluting stent was successfully placed using a STENT RESOLUTE ONYX [id number].  Post intervention, there is a 0% residual stenosis.  1.  Vessel coronary artery disease with chronically occluded proximal RCA and 95% stenosis proximal LAD, with 70% stenosis LPL 2 and LPL 3 2.  Mildly reduced left ventricular function with estimated LV ejection fraction 45 to 50% 3.  Successful PCI with DES proximal LAD Recommendations 1.  Dual antiplatelet therapy uninterrupted for 1 year 2.  Aggressive risk factor modification   ECHOCARDIOGRAM COMPLETE  Result Date: 06/13/2020    ECHOCARDIOGRAM REPORT   Patient Name:   Joshua Ferrell. Date of Exam: 06/13/2020 Medical Rec #:  [id number]               Height:       68.0 in Accession #:    [id number]              Weight:       216.7 lb Date of Birth:  1945/03/06               BSA:          2.115 m Patient Age:    75 years                BP:           132/60 mmHg Patient Gender: M                       HR:           76 bpm. Exam Location:  [facility] Procedure: 2D Echo, Color Doppler, Cardiac Doppler and Intracardiac            Opacification Agent Indications:     I21.9 Acute myocardial infarction  History:         Patient has no prior history of Echocardiogram examinations.                  Previous Myocardial Infarction and CAD; Risk                  Factors:Hypertension, Diabetes and Dyslipidemia.  Sonographer:     Charmayne Sheer RDCS (AE) Referring Phys:  [place] Diagnosing Phys: Isaias Cowman MD  Sonographer Comments: Technically difficult study due to poor echo windows. Image acquisition challenging due to patient body habitus. IMPRESSIONS  1. Left ventricular ejection fraction, by estimation, is 45 to 50%. The left ventricle  has mildly decreased function. The left ventricle has no regional wall motion abnormalities. Left ventricular diastolic parameters are consistent with Grade I diastolic dysfunction (impaired relaxation).  2. Right ventricular  systolic function is normal. The right ventricular size is normal.  3. The mitral valve is normal in structure. Mild mitral valve regurgitation. No evidence of mitral stenosis.  4. The aortic valve is normal in structure. Aortic valve regurgitation is not visualized. Mild aortic valve stenosis.  5. The inferior vena cava is normal in size with greater than 50% respiratory variability, suggesting right atrial pressure of 3 mmHg. FINDINGS  Left Ventricle: Left ventricular ejection fraction, by estimation, is 45 to 50%. The left ventricle has mildly decreased function. The left ventricle has no regional wall motion abnormalities. Definity contrast agent was given IV to delineate the left ventricular endocardial borders. The left ventricular internal cavity size was normal in size. There is no left ventricular hypertrophy. Left ventricular diastolic parameters are consistent with Grade I diastolic dysfunction (impaired relaxation). Right Ventricle: The right ventricular size is normal. No increase in right ventricular wall thickness. Right ventricular systolic function is normal. Left Atrium: Left atrial size was normal in size. Right Atrium: Right atrial size was normal in size. Pericardium: There is no evidence of pericardial effusion. Mitral Valve: The mitral valve is normal in structure. Mild mitral valve regurgitation. No evidence of mitral valve stenosis. MV peak gradient, 3.0 mmHg. The mean mitral valve gradient is 1.0 mmHg. Tricuspid Valve: The tricuspid valve is normal in structure. Tricuspid valve regurgitation is mild . No evidence of tricuspid stenosis. Aortic Valve: The aortic valve is normal in structure. Aortic valve regurgitation is not visualized. Mild aortic stenosis is present.  Aortic valve mean gradient measures 5.0 mmHg. Aortic valve peak gradient measures 10.2 mmHg. Aortic valve area, by VTI measures 1.94 cm. Pulmonic Valve: The pulmonic valve was normal in structure. Pulmonic valve regurgitation is not visualized. No evidence of pulmonic stenosis. Aorta: The aortic root is normal in size and structure. Venous: The inferior vena cava is normal in size with greater than 50% respiratory variability, suggesting right atrial pressure of 3 mmHg. IAS/Shunts: No atrial level shunt detected by color flow Doppler.  LEFT VENTRICLE PLAX 2D LVIDd:         5.00 cm  Diastology LVIDs:         3.90 cm  LV e' medial:    5.00 cm/s LV PW:         1.10 cm  LV E/e' medial:  12.6 LV IVS:        0.90 cm  LV e' lateral:   8.05 cm/s LVOT diam:     2.10 cm  LV E/e' lateral: 7.8 LV SV:         55 LV SV Index:   26 LVOT Area:     3.46 cm  RIGHT VENTRICLE RV Basal diam:  3.60 cm LEFT ATRIUM             Index       RIGHT ATRIUM           Index LA diam:        3.70 cm 1.75 cm/m  RA Area:     12.70 cm LA Vol (A2C):   43.3 ml 20.48 ml/m RA Volume:   32.50 ml  15.37 ml/m LA Vol (A4C):   48.8 ml 23.08 ml/m LA Biplane Vol: 47.4 ml 22.42 ml/m  AORTIC VALVE                    PULMONIC VALVE AV Area (Vmax):    1.94 cm     PV Vmax:  1.08 m/s AV Area (Vmean):   2.25 cm     PV Vmean:      73.600 cm/s AV Area (VTI):     1.94 cm     PV VTI:        0.185 m AV Vmax:           160.00 cm/s  PV Peak grad:  4.7 mmHg AV Vmean:          106.000 cm/s PV Mean grad:  2.0 mmHg AV VTI:            0.285 m AV Peak Grad:      10.2 mmHg AV Mean Grad:      5.0 mmHg LVOT Vmax:         89.40 cm/s LVOT Vmean:        69.000 cm/s LVOT VTI:          0.160 m LVOT/AV VTI ratio: 0.56  AORTA Ao Root diam: 3.50 cm MITRAL VALVE MV Area (PHT): 4.17 cm    SHUNTS MV Area VTI:   2.65 cm    Systemic VTI:  0.16 m MV Peak grad:  3.0 mmHg    Systemic Diam: 2.10 cm MV Mean grad:  1.0 mmHg MV Vmax:       0.86 m/s MV Vmean:      46.6 cm/s MV Decel  Time: 182 msec MV E velocity: 63.00 cm/s MV A velocity: 81.80 cm/s MV E/A ratio:  0.77 Isaias Cowman MD Electronically signed by Isaias Cowman MD Signature Date/Time: 06/13/2020/8:22:25 AM    Final        Subjective: No chest pain or shortness of breath  Discharge Exam: Vitals:   06/13/20 1535 06/13/20 2002 06/14/20 0514 06/14/20 0808  BP: 129/67 (!) 144/66 (!) 141/79 (!) 145/79  Pulse: 77 60 63 72  Resp: 19 18 18 19   Temp: 99 F (37.2 C) 98.3 F (36.8 C) 98.1 F (36.7 C) (!) 97.5 F (36.4 C)  TempSrc: Oral Oral  Axillary  SpO2: 97% 99% 98% 99%  Weight:      Height:        General: Pt is alert, awake, not in acute distress Cardiovascular: RRR, S1/S2 +, no rubs, no gallops Respiratory: CTA bilaterally, no wheezing, no rhonchi Abdominal: Soft, NT, ND, bowel sounds + Extremities: no edema, no cyanosis    The results of significant diagnostics from this hospitalization (including imaging, microbiology, ancillary and laboratory) are listed below for reference.     Microbiology: Recent Results (from the past 240 hour(s))  SARS CORONAVIRUS 2 (TAT 6-24 HRS) Nasopharyngeal Nasopharyngeal Swab     Status: None   Collection Time: 06/12/20  9:34 AM   Specimen: Nasopharyngeal Swab  Result Value Ref Range Status   SARS Coronavirus 2 NEGATIVE NEGATIVE Final    Comment: (NOTE) SARS-CoV-2 target nucleic acids are NOT DETECTED.  The SARS-CoV-2 RNA is generally detectable in upper and lower respiratory specimens during the acute phase of infection. Negative results do not preclude SARS-CoV-2 infection, do not rule out co-infections with other pathogens, and should not be used as the sole basis for treatment or other patient management decisions. Negative results must be combined with clinical observations, patient history, and epidemiological information. The expected result is Negative.  Fact Sheet for Patients: SugarRoll.be  Fact Sheet  for Healthcare Providers: https://www.woods-mathews.com/  This test is not yet approved or cleared by the [place] FDA and  has been authorized for detection and/or diagnosis of SARS-CoV-2 by FDA  under an Emergency Use Authorization (EUA). This EUA will remain  in effect (meaning this test can be used) for the duration of the COVID-19 declaration under Se ction 564(b)(1) of the Act, 21 U.S.C. section 360bbb-3(b)(1), unless the authorization is terminated or revoked sooner.  Performed at [facility], [place] [address], [place], [place] [address]   Resp Panel by RT-PCR (Flu A&B, Covid) Nasopharyngeal Swab     Status: None   Collection Time: 06/12/20  9:39 AM   Specimen: Nasopharyngeal Swab; Nasopharyngeal(NP) swabs in vial transport medium  Result Value Ref Range Status   SARS Coronavirus 2 by RT PCR NEGATIVE NEGATIVE Final    Comment: (NOTE) SARS-CoV-2 target nucleic acids are NOT DETECTED.  The SARS-CoV-2 RNA is generally detectable in upper respiratory specimens during the acute phase of infection. The lowest concentration of SARS-CoV-2 viral copies this assay can detect is 138 copies/mL. A negative result does not preclude SARS-Cov-2 infection and should not be used as the sole basis for treatment or other patient management decisions. A negative result may occur with  improper specimen collection/handling, submission of specimen other than nasopharyngeal swab, presence of viral mutation(s) within the areas targeted by this assay, and inadequate number of viral copies(<138 copies/mL). A negative result must be combined with clinical observations, patient history, and epidemiological information. The expected result is Negative.  Fact Sheet for Patients:  EntrepreneurPulse.com.au  Fact Sheet for Healthcare Providers:  IncredibleEmployment.be  This test is no t yet approved or cleared by the [place] FDA and  has  been authorized for detection and/or diagnosis of SARS-CoV-2 by FDA under an Emergency Use Authorization (EUA). This EUA will remain  in effect (meaning this test can be used) for the duration of the COVID-19 declaration under Section 564(b)(1) of the Act, 21 U.S.C.section 360bbb-3(b)(1), unless the authorization is terminated  or revoked sooner.       Influenza A by PCR NEGATIVE NEGATIVE Final   Influenza B by PCR NEGATIVE NEGATIVE Final    Comment: (NOTE) The Xpert Xpress SARS-CoV-2/FLU/RSV plus assay is intended as an aid in the diagnosis of influenza from Nasopharyngeal swab specimens and should not be used as a sole basis for treatment. Nasal washings and aspirates are unacceptable for Xpert Xpress SARS-CoV-2/FLU/RSV testing.  Fact Sheet for Patients: EntrepreneurPulse.com.au  Fact Sheet for Healthcare Providers: IncredibleEmployment.be  This test is not yet approved or cleared by the [place] FDA and has been authorized for detection and/or diagnosis of SARS-CoV-2 by FDA under an Emergency Use Authorization (EUA). This EUA will remain in effect (meaning this test can be used) for the duration of the COVID-19 declaration under Section 564(b)(1) of the Act, 21 U.S.C. section 360bbb-3(b)(1), unless the authorization is terminated or revoked.  Performed at [facility], [address] [place], [place] [address]      Labs: BNP (last 3 results) No results for input(s): BNP in the last 8760 hours. Basic Metabolic Panel: Recent Labs  Lab 06/12/20 0916 06/13/20 0544  NA 135 135  K 4.1 4.2  CL 101 103  CO2 22 24  GLUCOSE 261* 189*  BUN 25* 19  CREATININE 1.23 0.98  CALCIUM 9.4 8.7*  MG  --  1.7   Liver Function Tests: No results for input(s): AST, ALT, ALKPHOS, BILITOT, PROT, ALBUMIN in the last 168 hours. No results for input(s): LIPASE, AMYLASE in the last 168 hours. No results for input(s): AMMONIA in the last  168 hours. CBC: Recent Labs  Lab  06/12/20 0916 06/13/20 0544  WBC 9.7 8.2  HGB 13.2 11.9*  HCT 37.7* 34.5*  MCV 95.0 95.0  PLT 177 155   Cardiac Enzymes: No results for input(s): CKTOTAL, CKMB, CKMBINDEX, TROPONINI in the last 168 hours. BNP: Invalid input(s): POCBNP CBG: Recent Labs  Lab 06/13/20 0807 06/13/20 1114 06/13/20 1613 06/13/20 2001 06/14/20 0809  GLUCAP 183* 250* 252* 254* 212*   D-Dimer No results for input(s): DDIMER in the last 72 hours. Hgb A1c Recent Labs    06/12/20 0916  HGBA1C 7.2*   Lipid Profile No results for input(s): CHOL, HDL, LDLCALC, TRIG, CHOLHDL, LDLDIRECT in the last 72 hours. Thyroid function studies No results for input(s): TSH, T4TOTAL, T3FREE, THYROIDAB in the last 72 hours.  Invalid input(s): FREET3 Anemia work up No results for input(s): VITAMINB12, FOLATE, FERRITIN, TIBC, IRON, RETICCTPCT in the last 72 hours. Urinalysis    Component Value Date/Time   COLORURINE YELLOW (A) 10/17/2018 1403   APPEARANCEUR CLEAR (A) 10/17/2018 1403   LABSPEC 1.006 10/17/2018 1403   PHURINE 5.0 10/17/2018 1403   GLUCOSEU >=500 (A) 10/17/2018 1403   HGBUR NEGATIVE 10/17/2018 1403   BILIRUBINUR NEGATIVE 10/17/2018 1403   KETONESUR NEGATIVE 10/17/2018 1403   PROTEINUR NEGATIVE 10/17/2018 1403   NITRITE NEGATIVE 10/17/2018 1403   LEUKOCYTESUR NEGATIVE 10/17/2018 1403   Sepsis Labs Invalid input(s): PROCALCITONIN,  WBC,  LACTICIDVEN Microbiology Recent Results (from the past 240 hour(s))  SARS CORONAVIRUS 2 (TAT 6-24 HRS) Nasopharyngeal Nasopharyngeal Swab     Status: None   Collection Time: 06/12/20  9:34 AM   Specimen: Nasopharyngeal Swab  Result Value Ref Range Status   SARS Coronavirus 2 NEGATIVE NEGATIVE Final    Comment: (NOTE) SARS-CoV-2 target nucleic acids are NOT DETECTED.  The SARS-CoV-2 RNA is generally detectable in upper and lower respiratory specimens during the acute phase of infection. Negative results do not preclude  SARS-CoV-2 infection, do not rule out co-infections with other pathogens, and should not be used as the sole basis for treatment or other patient management decisions. Negative results must be combined with clinical observations, patient history, and epidemiological information. The expected result is Negative.  Fact Sheet for Patients: SugarRoll.be  Fact Sheet for Healthcare Providers: https://www.woods-mathews.com/  This test is not yet approved or cleared by the [place] FDA and  has been authorized for detection and/or diagnosis of SARS-CoV-2 by FDA under an Emergency Use Authorization (EUA). This EUA will remain  in effect (meaning this test can be used) for the duration of the COVID-19 declaration under Se ction 564(b)(1) of the Act, 21 U.S.C. section 360bbb-3(b)(1), unless the authorization is terminated or revoked sooner.  Performed at [facility], [place] [address] [place], [place] [address]   Resp Panel by RT-PCR (Flu A&B, Covid) Nasopharyngeal Swab     Status: None   Collection Time: 06/12/20  9:39 AM   Specimen: Nasopharyngeal Swab; Nasopharyngeal(NP) swabs in vial transport medium  Result Value Ref Range Status   SARS Coronavirus 2 by RT PCR NEGATIVE NEGATIVE Final    Comment: (NOTE) SARS-CoV-2 target nucleic acids are NOT DETECTED.  The SARS-CoV-2 RNA is generally detectable in upper respiratory specimens during the acute phase of infection. The lowest concentration of SARS-CoV-2 viral copies this assay can detect is 138 copies/mL. A negative result does not preclude SARS-Cov-2 infection and should not be used as the sole basis for treatment or other patient management decisions. A negative result may occur with  improper specimen collection/handling, submission of specimen  other than nasopharyngeal swab, presence of viral mutation(s) within the areas targeted by this assay, and inadequate number of  viral copies(<138 copies/mL). A negative result must be combined with clinical observations, patient history, and epidemiological information. The expected result is Negative.  Fact Sheet for Patients:  EntrepreneurPulse.com.au  Fact Sheet for Healthcare Providers:  IncredibleEmployment.be  This test is no t yet approved or cleared by the [place] FDA and  has been authorized for detection and/or diagnosis of SARS-CoV-2 by FDA under an Emergency Use Authorization (EUA). This EUA will remain  in effect (meaning this test can be used) for the duration of the COVID-19 declaration under Section 564(b)(1) of the Act, 21 U.S.C.section 360bbb-3(b)(1), unless the authorization is terminated  or revoked sooner.       Influenza A by PCR NEGATIVE NEGATIVE Final   Influenza B by PCR NEGATIVE NEGATIVE Final    Comment: (NOTE) The Xpert Xpress SARS-CoV-2/FLU/RSV plus assay is intended as an aid in the diagnosis of influenza from Nasopharyngeal swab specimens and should not be used as a sole basis for treatment. Nasal washings and aspirates are unacceptable for Xpert Xpress SARS-CoV-2/FLU/RSV testing.  Fact Sheet for Patients: EntrepreneurPulse.com.au  Fact Sheet for Healthcare Providers: IncredibleEmployment.be  This test is not yet approved or cleared by the [place] FDA and has been authorized for detection and/or diagnosis of SARS-CoV-2 by FDA under an Emergency Use Authorization (EUA). This EUA will remain in effect (meaning this test can be used) for the duration of the COVID-19 declaration under Section 564(b)(1) of the Act, 21 U.S.C. section 360bbb-3(b)(1), unless the authorization is terminated or revoked.  Performed at [facility], [address], [place], [place] [address]      Time coordinating discharge: 35mins  SIGNED:   Kathie Dike, MD  [facility] 06/14/2020,  5:34 PM   If 7PM-7AM, please contact night-coverage [email or web address]

## 2020-06-14 NOTE — Progress Notes (Signed)
[facility]  SUBJECTIVE: Patient sitting in chair, reports doing well, denies chest pain or shortness of breath   Vitals:   06/13/20 1535 06/13/20 2002 06/14/20 0514 06/14/20 0808  BP: 129/67 (!) 144/66 (!) 141/79 (!) 145/79  Pulse: 77 60 63 72  Resp: 19 18 18 19   Temp: 99 F (37.2 C) 98.3 F (36.8 C) 98.1 F (36.7 C) (!) 97.5 F (36.4 C)  TempSrc: Oral Oral  Axillary  SpO2: 97% 99% 98% 99%  Weight:      Height:        No intake or output data in the 24 hours ending 06/14/20 0924    PHYSICAL EXAM  General: Well developed, well nourished, in no acute distress HEENT:  Normocephalic and atramatic Neck:  No JVD.  Lungs: Clear bilaterally to auscultation and percussion. Heart: HRRR . Normal S1 and S2 without gallops or murmurs.  Abdomen: Bowel sounds are positive, abdomen soft and non-tender  Msk:  Back normal, normal gait. Normal strength and tone for age. Extremities: No clubbing, cyanosis or edema.   Neuro: Alert and oriented X 3. Psych:  Good affect, responds appropriately   LABS: Basic Metabolic Panel: Recent Labs    06/12/20 0916 06/13/20 0544  NA 135 135  K 4.1 4.2  CL 101 103  CO2 22 24  GLUCOSE 261* 189*  BUN 25* 19  CREATININE 1.23 0.98  CALCIUM 9.4 8.7*  MG  --  1.7   Liver Function Tests: No results for input(s): AST, ALT, ALKPHOS, BILITOT, PROT, ALBUMIN in the last 72 hours. No results for input(s): LIPASE, AMYLASE in the last 72 hours. CBC: Recent Labs    06/12/20 0916 06/13/20 0544  WBC 9.7 8.2  HGB 13.2 11.9*  HCT 37.7* 34.5*  MCV 95.0 95.0  PLT 177 155   Cardiac Enzymes: No results for input(s): CKTOTAL, CKMB, CKMBINDEX, TROPONINI in the last 72 hours. BNP: Invalid input(s): POCBNP D-Dimer: No results for input(s): DDIMER in the last 72 hours. Hemoglobin A1C: Recent Labs    06/12/20 0916  HGBA1C 7.2*   Fasting Lipid Panel: No results for input(s): CHOL, HDL, LDLCALC, TRIG, CHOLHDL, LDLDIRECT in the last 72 hours. Thyroid  Function Tests: No results for input(s): TSH, T4TOTAL, T3FREE, THYROIDAB in the last 72 hours.  Invalid input(s): FREET3 Anemia Panel: No results for input(s): VITAMINB12, FOLATE, FERRITIN, TIBC, IRON, RETICCTPCT in the last 72 hours.  CARDIAC CATHETERIZATION  Result Date: 06/12/2020  Colon Flattery RCA lesion is 100% stenosed.  Ost LAD to Prox LAD lesion is 95% stenosed.  2nd LPL lesion is 70% stenosed.  3rd LPL lesion is 70% stenosed.  Prox Cx lesion is 30% stenosed.  Mid LAD lesion is 50% stenosed.  A drug-eluting stent was successfully placed using a STENT RESOLUTE ONYX [id number].  Post intervention, there is a 0% residual stenosis.  1.  Vessel coronary artery disease with chronically occluded proximal RCA and 95% stenosis proximal LAD, with 70% stenosis LPL 2 and LPL 3 2.  Mildly reduced left ventricular function with estimated LV ejection fraction 45 to 50% 3.  Successful PCI with DES proximal LAD Recommendations 1.  Dual antiplatelet therapy uninterrupted for 1 year 2.  Aggressive risk factor modification   ECHOCARDIOGRAM COMPLETE  Result Date: 06/13/2020    ECHOCARDIOGRAM REPORT   Patient Name:   Joshua Ferrell. Date of Exam: 06/13/2020 Medical Rec #:  [id number]               Height:  68.0 in Accession #:    [id number]              Weight:       216.7 lb Date of Birth:  08/31/1945               BSA:          2.115 m Patient Age:    75 years                BP:           132/60 mmHg Patient Gender: M                       HR:           76 bpm. Exam Location:  [facility] Procedure: 2D Echo, Color Doppler, Cardiac Doppler and Intracardiac            Opacification Agent Indications:     I21.9 Acute myocardial infarction  History:         Patient has no prior history of Echocardiogram examinations.                  Previous Myocardial Infarction and CAD; Risk                  Factors:Hypertension, Diabetes and Dyslipidemia.  Sonographer:     Charmayne Sheer RDCS (AE) Referring Phys:  [place] Diagnosing Phys: Isaias Cowman MD  Sonographer Comments: Technically difficult study due to poor echo windows. Image acquisition challenging due to patient body habitus. IMPRESSIONS  1. Left ventricular ejection fraction, by estimation, is 45 to 50%. The left ventricle has mildly decreased function. The left ventricle has no regional wall motion abnormalities. Left ventricular diastolic parameters are consistent with Grade I diastolic dysfunction (impaired relaxation).  2. Right ventricular systolic function is normal. The right ventricular size is normal.  3. The mitral valve is normal in structure. Mild mitral valve regurgitation. No evidence of mitral stenosis.  4. The aortic valve is normal in structure. Aortic valve regurgitation is not visualized. Mild aortic valve stenosis.  5. The inferior vena cava is normal in size with greater than 50% respiratory variability, suggesting right atrial pressure of 3 mmHg. FINDINGS  Left Ventricle: Left ventricular ejection fraction, by estimation, is 45 to 50%. The left ventricle has mildly decreased function. The left ventricle has no regional wall motion abnormalities. Definity contrast agent was given IV to delineate the left ventricular endocardial borders. The left ventricular internal cavity size was normal in size. There is no left ventricular hypertrophy. Left ventricular diastolic parameters are consistent with Grade I diastolic dysfunction (impaired relaxation). Right Ventricle: The right ventricular size is normal. No increase in right ventricular wall thickness. Right ventricular systolic function is normal. Left Atrium: Left atrial size was normal in size. Right Atrium: Right atrial size was normal in size. Pericardium: There is no evidence of pericardial effusion. Mitral Valve: The mitral valve is normal in structure. Mild mitral valve regurgitation. No evidence of mitral valve stenosis. MV peak gradient, 3.0 mmHg. The mean mitral valve  gradient is 1.0 mmHg. Tricuspid Valve: The tricuspid valve is normal in structure. Tricuspid valve regurgitation is mild . No evidence of tricuspid stenosis. Aortic Valve: The aortic valve is normal in structure. Aortic valve regurgitation is not visualized. Mild aortic stenosis is present. Aortic valve mean gradient measures 5.0 mmHg. Aortic valve peak gradient measures 10.2 mmHg. Aortic valve area, by VTI measures  1.94 cm. Pulmonic Valve: The pulmonic valve was normal in structure. Pulmonic valve regurgitation is not visualized. No evidence of pulmonic stenosis. Aorta: The aortic root is normal in size and structure. Venous: The inferior vena cava is normal in size with greater than 50% respiratory variability, suggesting right atrial pressure of 3 mmHg. IAS/Shunts: No atrial level shunt detected by color flow Doppler.  LEFT VENTRICLE PLAX 2D LVIDd:         5.00 cm  Diastology LVIDs:         3.90 cm  LV e' medial:    5.00 cm/s LV PW:         1.10 cm  LV E/e' medial:  12.6 LV IVS:        0.90 cm  LV e' lateral:   8.05 cm/s LVOT diam:     2.10 cm  LV E/e' lateral: 7.8 LV SV:         55 LV SV Index:   26 LVOT Area:     3.46 cm  RIGHT VENTRICLE RV Basal diam:  3.60 cm LEFT ATRIUM             Index       RIGHT ATRIUM           Index LA diam:        3.70 cm 1.75 cm/m  RA Area:     12.70 cm LA Vol (A2C):   43.3 ml 20.48 ml/m RA Volume:   32.50 ml  15.37 ml/m LA Vol (A4C):   48.8 ml 23.08 ml/m LA Biplane Vol: 47.4 ml 22.42 ml/m  AORTIC VALVE                    PULMONIC VALVE AV Area (Vmax):    1.94 cm     PV Vmax:       1.08 m/s AV Area (Vmean):   2.25 cm     PV Vmean:      73.600 cm/s AV Area (VTI):     1.94 cm     PV VTI:        0.185 m AV Vmax:           160.00 cm/s  PV Peak grad:  4.7 mmHg AV Vmean:          106.000 cm/s PV Mean grad:  2.0 mmHg AV VTI:            0.285 m AV Peak Grad:      10.2 mmHg AV Mean Grad:      5.0 mmHg LVOT Vmax:         89.40 cm/s LVOT Vmean:        69.000 cm/s LVOT VTI:           0.160 m LVOT/AV VTI ratio: 0.56  AORTA Ao Root diam: 3.50 cm MITRAL VALVE MV Area (PHT): 4.17 cm    SHUNTS MV Area VTI:   2.65 cm    Systemic VTI:  0.16 m MV Peak grad:  3.0 mmHg    Systemic Diam: 2.10 cm MV Mean grad:  1.0 mmHg MV Vmax:       0.86 m/s MV Vmean:      46.6 cm/s MV Decel Time: 182 msec MV E velocity: 63.00 cm/s MV A velocity: 81.80 cm/s MV E/A ratio:  0.77 Isaias Cowman MD Electronically signed by Isaias Cowman MD Signature Date/Time: 06/13/2020/8:22:25 AM    Final      Echo LVEF 45 to 50%  TELEMETRY: Sinus rhythm:  ASSESSMENT AND PLAN:  Principal Problem:   Acute ST elevation myocardial infarction (STEMI) of lateral wall (HCC) Active Problems:   Coronary artery disease   Hypertension   Diabetes mellitus without complication ([place])    1. Possible lateral STEMI versus non-ST elevation myocardial infarction, with chronically occluded RCA with left-to-right collaterals, high-grade stenosis proximal LAD with successful PCI with DES proximal LAD.Left ventriculography reveals mild reduced left ventricular function.  2D echocardiogram pending. 2.Essential hypertension, blood pressure adequately controlled on current BP medications 3.Hyperlipidemia, with history of statin intolerance to rosuvastatin, atorvastatin, and simvastatin  Recommendations  1.  Dual antiplatelet therapy uninterrupted for 1 year 2.  Continue Zetia 10 mg daily 3.  Continue metoprolol succinate 50 mg daily 4.  May discharge home 5.  Follow-up with me in 1 week   Isaias Cowman, MD, PhD, [facility] 06/14/2020 9:24 AM

## 2020-06-16 LAB — POCT ACTIVATED CLOTTING TIME: Activated Clotting Time: 297 seconds

## 2020-06-23 DIAGNOSIS — E785 Hyperlipidemia, unspecified: Secondary | ICD-10-CM | POA: Diagnosis not present

## 2020-06-23 DIAGNOSIS — Z8673 Personal history of transient ischemic attack (TIA), and cerebral infarction without residual deficits: Secondary | ICD-10-CM | POA: Diagnosis not present

## 2020-06-23 DIAGNOSIS — G720 Drug-induced myopathy: Secondary | ICD-10-CM | POA: Diagnosis not present

## 2020-06-23 DIAGNOSIS — I214 Non-ST elevation (NSTEMI) myocardial infarction: Secondary | ICD-10-CM | POA: Insufficient documentation

## 2020-06-23 DIAGNOSIS — I2510 Atherosclerotic heart disease of native coronary artery without angina pectoris: Secondary | ICD-10-CM | POA: Diagnosis not present

## 2020-06-23 DIAGNOSIS — I252 Old myocardial infarction: Secondary | ICD-10-CM | POA: Diagnosis not present

## 2020-06-23 DIAGNOSIS — I10 Essential (primary) hypertension: Secondary | ICD-10-CM | POA: Diagnosis not present

## 2020-06-23 DIAGNOSIS — E1121 Type 2 diabetes mellitus with diabetic nephropathy: Secondary | ICD-10-CM | POA: Diagnosis not present

## 2020-06-23 DIAGNOSIS — R0600 Dyspnea, unspecified: Secondary | ICD-10-CM | POA: Diagnosis not present

## 2020-06-23 DIAGNOSIS — R079 Chest pain, unspecified: Secondary | ICD-10-CM | POA: Diagnosis not present

## 2020-06-23 DIAGNOSIS — T466X5A Adverse effect of antihyperlipidemic and antiarteriosclerotic drugs, initial encounter: Secondary | ICD-10-CM | POA: Diagnosis not present

## 2020-07-08 DIAGNOSIS — I252 Old myocardial infarction: Secondary | ICD-10-CM | POA: Diagnosis not present

## 2020-07-08 DIAGNOSIS — R519 Headache, unspecified: Secondary | ICD-10-CM | POA: Diagnosis not present

## 2020-07-08 DIAGNOSIS — G4484 Primary exertional headache: Secondary | ICD-10-CM | POA: Diagnosis not present

## 2020-08-27 DIAGNOSIS — I10 Essential (primary) hypertension: Secondary | ICD-10-CM | POA: Diagnosis not present

## 2020-08-27 DIAGNOSIS — I2510 Atherosclerotic heart disease of native coronary artery without angina pectoris: Secondary | ICD-10-CM | POA: Diagnosis not present

## 2020-08-27 DIAGNOSIS — E1121 Type 2 diabetes mellitus with diabetic nephropathy: Secondary | ICD-10-CM | POA: Diagnosis not present

## 2020-09-03 DIAGNOSIS — I2510 Atherosclerotic heart disease of native coronary artery without angina pectoris: Secondary | ICD-10-CM | POA: Diagnosis not present

## 2020-09-03 DIAGNOSIS — Z0000 Encounter for general adult medical examination without abnormal findings: Secondary | ICD-10-CM | POA: Diagnosis not present

## 2020-09-03 DIAGNOSIS — I10 Essential (primary) hypertension: Secondary | ICD-10-CM | POA: Diagnosis not present

## 2020-09-03 DIAGNOSIS — T466X5A Adverse effect of antihyperlipidemic and antiarteriosclerotic drugs, initial encounter: Secondary | ICD-10-CM | POA: Diagnosis not present

## 2020-09-03 DIAGNOSIS — E1121 Type 2 diabetes mellitus with diabetic nephropathy: Secondary | ICD-10-CM | POA: Diagnosis not present

## 2020-09-03 DIAGNOSIS — E785 Hyperlipidemia, unspecified: Secondary | ICD-10-CM | POA: Diagnosis not present

## 2020-09-03 DIAGNOSIS — Z23 Encounter for immunization: Secondary | ICD-10-CM | POA: Diagnosis not present

## 2020-09-03 DIAGNOSIS — G720 Drug-induced myopathy: Secondary | ICD-10-CM | POA: Diagnosis not present

## 2020-10-16 DIAGNOSIS — I252 Old myocardial infarction: Secondary | ICD-10-CM | POA: Diagnosis not present

## 2020-10-16 DIAGNOSIS — E785 Hyperlipidemia, unspecified: Secondary | ICD-10-CM | POA: Diagnosis not present

## 2020-10-16 DIAGNOSIS — T466X5A Adverse effect of antihyperlipidemic and antiarteriosclerotic drugs, initial encounter: Secondary | ICD-10-CM | POA: Diagnosis not present

## 2020-10-16 DIAGNOSIS — G720 Drug-induced myopathy: Secondary | ICD-10-CM | POA: Diagnosis not present

## 2020-10-16 DIAGNOSIS — Z8673 Personal history of transient ischemic attack (TIA), and cerebral infarction without residual deficits: Secondary | ICD-10-CM | POA: Diagnosis not present

## 2020-10-16 DIAGNOSIS — E1121 Type 2 diabetes mellitus with diabetic nephropathy: Secondary | ICD-10-CM | POA: Diagnosis not present

## 2020-10-16 DIAGNOSIS — I2510 Atherosclerotic heart disease of native coronary artery without angina pectoris: Secondary | ICD-10-CM | POA: Diagnosis not present

## 2020-10-16 DIAGNOSIS — R0609 Other forms of dyspnea: Secondary | ICD-10-CM | POA: Diagnosis not present

## 2020-10-16 DIAGNOSIS — Z23 Encounter for immunization: Secondary | ICD-10-CM | POA: Diagnosis not present

## 2020-10-16 DIAGNOSIS — R079 Chest pain, unspecified: Secondary | ICD-10-CM | POA: Diagnosis not present

## 2020-10-16 DIAGNOSIS — I10 Essential (primary) hypertension: Secondary | ICD-10-CM | POA: Diagnosis not present

## 2020-10-16 DIAGNOSIS — I214 Non-ST elevation (NSTEMI) myocardial infarction: Secondary | ICD-10-CM | POA: Diagnosis not present

## 2020-10-24 ENCOUNTER — Encounter: Payer: PPO | Attending: Cardiology | Admitting: *Deleted

## 2020-10-24 ENCOUNTER — Other Ambulatory Visit: Payer: Self-pay

## 2020-10-24 DIAGNOSIS — Z955 Presence of coronary angioplasty implant and graft: Secondary | ICD-10-CM

## 2020-10-24 DIAGNOSIS — Z87891 Personal history of nicotine dependence: Secondary | ICD-10-CM | POA: Insufficient documentation

## 2020-10-24 DIAGNOSIS — I213 ST elevation (STEMI) myocardial infarction of unspecified site: Secondary | ICD-10-CM

## 2020-10-24 NOTE — Progress Notes (Signed)
Initial telephone orientation completed. Diagnosis can be found in [facility] 9/29. EP orientation scheduled for Monday 10/17 at 8am.

## 2020-11-03 ENCOUNTER — Encounter: Payer: PPO | Admitting: *Deleted

## 2020-11-03 ENCOUNTER — Other Ambulatory Visit: Payer: Self-pay

## 2020-11-03 VITALS — Ht 68.2 in | Wt 216.9 lb

## 2020-11-03 DIAGNOSIS — Z955 Presence of coronary angioplasty implant and graft: Secondary | ICD-10-CM

## 2020-11-03 DIAGNOSIS — I213 ST elevation (STEMI) myocardial infarction of unspecified site: Secondary | ICD-10-CM

## 2020-11-03 DIAGNOSIS — Z87891 Personal history of nicotine dependence: Secondary | ICD-10-CM | POA: Diagnosis not present

## 2020-11-03 NOTE — Patient Instructions (Signed)
Patient Instructions  Patient Details  Name: Joshua Ferrell. MRN: [id number] Date of Birth: 1945/07/22 Referring Provider:  Isaias Cowman, MD  Below are your personal goals for exercise, nutrition, and risk factors. Our goal is to help you stay on track towards obtaining and maintaining these goals. We will be discussing your progress on these goals with you throughout the program.  Initial Exercise Prescription:  Initial Exercise Prescription - 11/03/20 0900       Date of Initial Exercise RX and Referring Provider   Date 11/03/20    Referring Provider Paraschos, Alexander MD      Oxygen   Maintain Oxygen Saturation 88% or higher      Treadmill   MPH 2    Grade 0.5    Minutes 15    METs 2.67      Recumbant Bike   Level 2    RPM 50    Watts 10    Minutes 15    METs 2      REL-XR   Level 2    Speed 50    Minutes 15    METs 2      Prescription Details   Frequency (times per week) 3    Duration Progress to 30 minutes of continuous aerobic without signs/symptoms of physical distress      Intensity   THRR 40-80% of Max Heartrate 94-127    Ratings of Perceived Exertion 11-13    Perceived Dyspnea 0-4      Progression   Progression Continue to progress workloads to maintain intensity without signs/symptoms of physical distress.      Resistance Training   Training Prescription Yes    Weight 4 lb    Reps 10-15             Exercise Goals: Frequency: Be able to perform aerobic exercise two to three times per week in program working toward 2-5 days per week of home exercise.  Intensity: Work with a perceived exertion of 11 (fairly light) - 15 (hard) while following your exercise prescription.  We will make changes to your prescription with you as you progress through the program.   Duration: Be able to do 30 to 45 minutes of continuous aerobic exercise in addition to a 5 minute warm-up and a 5 minute cool-down routine.   Nutrition Goals: Your  personal nutrition goals will be established when you do your nutrition analysis with the dietician.  The following are general nutrition guidelines to follow: Cholesterol < 200mg /day Sodium < 1500mg /day Fiber: Men over 50 yrs - 30 grams per day  Personal Goals:  Personal Goals and Risk Factors at Admission - 11/03/20 1004       Core Components/Risk Factors/Patient Goals on Admission    Weight Management Yes;Weight Loss;Obesity    Intervention Weight Management: Develop a combined nutrition and exercise program designed to reach desired caloric intake, while maintaining appropriate intake of nutrient and fiber, sodium and fats, and appropriate energy expenditure required for the weight goal.;Weight Management: Provide education and appropriate resources to help participant work on and attain dietary goals.;Weight Management/Obesity: Establish reasonable short term and long term weight goals.;Obesity: Provide education and appropriate resources to help participant work on and attain dietary goals.    Admit Weight 216 lb 14.4 oz (98.4 kg)    Goal Weight: Short Term 210 lb (95.3 kg)    Goal Weight: Long Term 170 lb (77.1 kg)    Expected Outcomes Long Term: Adherence to nutrition and  physical activity/exercise program aimed toward attainment of established weight goal;Short Term: Continue to assess and modify interventions until short term weight is achieved;Understanding recommendations for meals to include 15-35% energy as protein, 25-35% energy from fat, 35-60% energy from carbohydrates, less than 200mg  of dietary cholesterol, 20-35 gm of total fiber daily;Weight Loss: Understanding of general recommendations for a balanced deficit meal plan, which promotes 1-2 lb weight loss per week and includes a negative energy balance of [phone] kcal/d;Understanding of distribution of calorie intake throughout the day with the consumption of 4-5 meals/snacks    Diabetes Yes    Intervention Provide education  about signs/symptoms and action to take for hypo/hyperglycemia.;Provide education about proper nutrition, including hydration, and aerobic/resistive exercise prescription along with prescribed medications to achieve blood glucose in normal ranges: Fasting glucose 65-99 mg/dL    Expected Outcomes Short Term: Participant verbalizes understanding of the signs/symptoms and immediate care of hyper/hypoglycemia, proper foot care and importance of medication, aerobic/resistive exercise and nutrition plan for blood glucose control.;Long Term: Attainment of HbA1C < 7%.    Heart Failure Yes    Intervention Provide a combined exercise and nutrition program that is supplemented with education, support and counseling about heart failure. Directed toward relieving symptoms such as shortness of breath, decreased exercise tolerance, and extremity edema.    Expected Outcomes Improve functional capacity of life;Short term: Attendance in program 2-3 days a week with increased exercise capacity. Reported lower sodium intake. Reported increased fruit and vegetable intake. Reports medication compliance.;Short term: Daily weights obtained and reported for increase. Utilizing diuretic protocols set by physician.;Long term: Adoption of self-care skills and reduction of barriers for early signs and symptoms recognition and intervention leading to self-care maintenance.    Hypertension Yes    Intervention Provide education on lifestyle modifcations including regular physical activity/exercise, weight management, moderate sodium restriction and increased consumption of fresh fruit, vegetables, and low fat dairy, alcohol moderation, and smoking cessation.;Monitor prescription use compliance.    Expected Outcomes Short Term: Continued assessment and intervention until BP is < 140/34mm HG in hypertensive participants. < 130/40mm HG in hypertensive participants with diabetes, heart failure or chronic kidney disease.;Long Term: Maintenance  of blood pressure at goal levels.    Lipids Yes    Intervention Provide education and support for participant on nutrition & aerobic/resistive exercise along with prescribed medications to achieve LDL 70mg , HDL >40mg .    Expected Outcomes Short Term: Participant states understanding of desired cholesterol values and is compliant with medications prescribed. Participant is following exercise prescription and nutrition guidelines.;Long Term: Cholesterol controlled with medications as prescribed, with individualized exercise RX and with personalized nutrition plan. Value goals: LDL < 70mg , HDL > 40 mg.             Tobacco Use Initial Evaluation: Social History   Tobacco Use  Smoking Status Former   Packs/day: 0.00   Years: 0.00   Pack years: 0.00   Types: Cigarettes  Smokeless Tobacco Former   Types: Chew   Quit date: 1990    Exercise Goals and Review:  Exercise Goals     [place] Name 11/03/20 1002             Exercise Goals   Increase Physical Activity Yes       Intervention Provide advice, education, support and counseling about physical activity/exercise needs.;Develop an individualized exercise prescription for aerobic and resistive training based on initial evaluation findings, risk stratification, comorbidities and participant's personal goals.       Expected Outcomes  Short Term: Attend rehab on a regular basis to increase amount of physical activity.;Long Term: Add in home exercise to make exercise part of routine and to increase amount of physical activity.;Long Term: Exercising regularly at least 3-5 days a week.       Increase Strength and Stamina Yes       Intervention Provide advice, education, support and counseling about physical activity/exercise needs.;Develop an individualized exercise prescription for aerobic and resistive training based on initial evaluation findings, risk stratification, comorbidities and participant's personal goals.       Expected Outcomes  Short Term: Increase workloads from initial exercise prescription for resistance, speed, and METs.;Short Term: Perform resistance training exercises routinely during rehab and add in resistance training at home;Long Term: Improve cardiorespiratory fitness, muscular endurance and strength as measured by increased METs and functional capacity (6MWT)       Able to understand and use rate of perceived exertion (RPE) scale Yes       Intervention Provide education and explanation on how to use RPE scale       Expected Outcomes Short Term: Able to use RPE daily in rehab to express subjective intensity level;Long Term:  Able to use RPE to guide intensity level when exercising independently       Able to understand and use Dyspnea scale Yes       Intervention Provide education and explanation on how to use Dyspnea scale       Expected Outcomes Short Term: Able to use Dyspnea scale daily in rehab to express subjective sense of shortness of breath during exertion;Long Term: Able to use Dyspnea scale to guide intensity level when exercising independently       Knowledge and understanding of Target Heart Rate Range (THRR) Yes       Intervention Provide education and explanation of THRR including how the numbers were predicted and where they are located for reference       Expected Outcomes Short Term: Able to state/look up THRR;Short Term: Able to use daily as guideline for intensity in rehab;Long Term: Able to use THRR to govern intensity when exercising independently       Able to check pulse independently Yes       Intervention Provide education and demonstration on how to check pulse in carotid and radial arteries.;Review the importance of being able to check your own pulse for safety during independent exercise       Expected Outcomes Short Term: Able to explain why pulse checking is important during independent exercise;Long Term: Able to check pulse independently and accurately       Understanding of Exercise  Prescription Yes       Intervention Provide education, explanation, and written materials on patient's individual exercise prescription       Expected Outcomes Long Term: Able to explain home exercise prescription to exercise independently;Short Term: Able to explain program exercise prescription                Copy of goals given to participant.

## 2020-11-03 NOTE — Progress Notes (Signed)
Cardiac Individual Treatment Plan  Patient Details  Name: Joshua Ferrell. MRN: [id number] Date of Birth: [birth year] Referring Provider:   Flowsheet Row Cardiac Rehab from 11/03/2020 in [facility] Cardiac and Pulmonary Rehab  Referring Provider Isaias Cowman MD       Initial Encounter Date:  Flowsheet Row Cardiac Rehab from 11/03/2020 in [facility] Cardiac and Pulmonary Rehab  Date 11/03/20       Visit Diagnosis: ST elevation myocardial infarction (STEMI), unspecified artery [facility])  Status post coronary artery stent placement  Patient's Home Medications on Admission:  Current Outpatient Medications:    acetaminophen (TYLENOL) 650 MG CR tablet, Take by mouth. (Patient not taking: Reported on 10/24/2020), Disp: , Rfl:    amLODipine (NORVASC) 5 MG tablet, Take 5 mg by mouth daily., Disp: , Rfl:    aspirin EC 81 MG tablet, Take 81 mg by mouth daily., Disp: , Rfl:    clopidogrel (PLAVIX) 75 MG tablet, Take 1 tablet (75 mg total) by mouth daily with breakfast., Disp: 30 tablet, Rfl: 1   ezetimibe (ZETIA) 10 MG tablet, Take 1 tablet (10 mg total) by mouth daily., Disp: 30 tablet, Rfl: 1   glimepiride (AMARYL) 2 MG tablet, Take 2 mg by mouth daily with breakfast., Disp: , Rfl:    Investigational - Study Medication, Take 14 mg by mouth daily before breakfast. Study name: semaglutide 14 mg or placebo, Disp: , Rfl:    metFORMIN (GLUCOPHAGE) 500 MG tablet, Take 1,000 mg by mouth 2 (two) times daily with a meal., Disp: , Rfl:    metoprolol succinate (TOPROL-XL) 50 MG 24 hr tablet, Take 1 tablet (50 mg total) by mouth daily. Take with or immediately following a meal., Disp: 30 tablet, Rfl: 1   nitroGLYCERIN (NITROSTAT) 0.4 MG SL tablet, Place 0.4 mg under the tongue every 5 (five) minutes as needed for chest pain., Disp: , Rfl:    valsartan-hydrochlorothiazide (DIOVAN-HCT) 160-12.5 MG tablet, Take 1 tablet by mouth daily., Disp: , Rfl:   Past Medical History: Past Medical History:  Diagnosis  Date   Arthritis    Coronary artery disease    COVID-19 12/2018   Diabetes mellitus without complication (HCC)    Dyspnea    with exertion   GERD (gastroesophageal reflux disease)    minimal episodes   Headache(784.0)    Heart disease    History of blood transfusion 1990   internal injuries after a fall requiring blood   History of colonic polyps    Hyperglycemia    Hyperlipidemia    Hypertension    Myocardial infarction ([place]) 2003   PONV (postoperative nausea and vomiting)    one time only (surgery in 1990)    Tobacco Use: Social History   Tobacco Use  Smoking Status Former   Packs/day: 0.00   Years: 0.00   Pack years: 0.00   Types: Cigarettes  Smokeless Tobacco Former   Types: Chew   Quit date: 1990    Labs: 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   Exercise Prescription Goal: Initial exercise prescription builds to 30-45 minutes a day of aerobic activity, 2-3 days per week.  Home exercise guidelines will be  given to patient during program as part of exercise prescription that the participant will acknowledge.   Education: Aerobic Exercise: - Group verbal and visual presentation on the components of exercise prescription. Introduces F.I.T.T principle from ACSM for exercise prescriptions.  Reviews F.I.T.T. principles of aerobic exercise including progression. Written material given at graduation. Flowsheet Row Cardiac Rehab from 11/03/2020 in [facility] Cardiac and Pulmonary Rehab  Education need identified 11/03/20       Education: Resistance Exercise: - Group verbal and visual presentation on the components of exercise prescription. Introduces F.I.T.T  principle from ACSM for exercise prescriptions  Reviews F.I.T.T. principles of resistance exercise including progression. Written material given at graduation.    Education: Exercise & Equipment Safety: - Individual verbal instruction and demonstration of equipment use and safety with use of the equipment. Flowsheet Row Cardiac Rehab from 11/03/2020 in [facility] Cardiac and Pulmonary Rehab  Date 11/03/20  Educator [facility]  Instruction Review Code 1- Verbalizes Understanding       Education: Exercise Physiology & General Exercise Guidelines: - Group verbal and written instruction with models to review the exercise physiology of the cardiovascular system and associated critical values. Provides general exercise guidelines with specific guidelines to those with heart or lung disease.    Education: Flexibility, Balance, Mind/Body Relaxation: - Group verbal and visual presentation with interactive activity on the components of exercise prescription. Introduces F.I.T.T principle from ACSM for exercise prescriptions. Reviews F.I.T.T. principles of flexibility and balance exercise training including progression. Also discusses the mind body connection.  Reviews various relaxation techniques to help reduce and manage stress (i.e. Deep breathing, progressive muscle relaxation, and visualization). Balance handout provided to take home. Written material given at graduation.   Activity Barriers & Risk Stratification:  Activity Barriers & Cardiac Risk Stratification - 10/24/20 1304       Activity Barriers & Cardiac Risk Stratification   Activity Barriers Arthritis;Joint Problems    Cardiac Risk Stratification High             6 Minute Walk:  6 Minute Walk     Row Name 11/03/20 0948         6 Minute Walk   Phase Initial     Distance 1220 feet     Walk Time 6 minutes     # of Rest Breaks 0     MPH 2.31     METS 2.24     RPE 11     VO2 Peak 7.85     Symptoms Yes (comment)     Comments  chronic hip pain 5/10     Resting HR 62 bpm     Resting BP 126/72     Resting Oxygen Saturation  97 %     Exercise Oxygen Saturation  during 6 min walk 96 %     Max Ex. HR 97 bpm     Max Ex. BP 158/74     2 Minute Post BP 134/66              Oxygen Initial Assessment:   Oxygen Re-Evaluation:   Oxygen Discharge (Final Oxygen Re-Evaluation):   Initial Exercise Prescription:  Initial Exercise Prescription - 11/03/20 0900       Date of Initial Exercise RX and Referring Provider   Date 11/03/20    Referring Provider Isaias Cowman MD      Oxygen   Maintain Oxygen Saturation 88% or higher      Treadmill   MPH 2    Grade 0.5  Minutes 15    METs 2.67      Recumbant Bike   Level 2    RPM 50    Watts 10    Minutes 15    METs 2      REL-XR   Level 2    Speed 50    Minutes 15    METs 2      Prescription Details   Frequency (times per week) 3    Duration Progress to 30 minutes of continuous aerobic without signs/symptoms of physical distress      Intensity   THRR 40-80% of Max Heartrate 94-127    Ratings of Perceived Exertion 11-13    Perceived Dyspnea 0-4      Progression   Progression Continue to progress workloads to maintain intensity without signs/symptoms of physical distress.      Resistance Training   Training Prescription Yes    Weight 4 lb    Reps 10-15             Perform Capillary Blood Glucose checks as needed.  Exercise Prescription Changes:   Exercise Prescription Changes     Row Name 11/03/20 0900             Response to Exercise   Blood Pressure (Admit) 126/72       Blood Pressure (Exercise) 158/74       Blood Pressure (Exit) 134/66       Heart Rate (Admit) 62 bpm       Heart Rate (Exercise) 97 bpm       Heart Rate (Exit) 60 bpm       Oxygen Saturation (Admit) 97 %       Oxygen Saturation (Exercise) 96 %       Rating of Perceived Exertion (Exercise) 11       Symptoms chronic hip pain 5/10       Comments  walk test results                Exercise Comments:   Exercise Goals and Review:   Exercise Goals     Row Name 11/03/20 1002             Exercise Goals   Increase Physical Activity Yes       Intervention Provide advice, education, support and counseling about physical activity/exercise needs.;Develop an individualized exercise prescription for aerobic and resistive training based on initial evaluation findings, risk stratification, comorbidities and participant's personal goals.       Expected Outcomes Short Term: Attend rehab on a regular basis to increase amount of physical activity.;Long Term: Add in home exercise to make exercise part of routine and to increase amount of physical activity.;Long Term: Exercising regularly at least 3-5 days a week.       Increase Strength and Stamina Yes       Intervention Provide advice, education, support and counseling about physical activity/exercise needs.;Develop an individualized exercise prescription for aerobic and resistive training based on initial evaluation findings, risk stratification, comorbidities and participant's personal goals.       Expected Outcomes Short Term: Increase workloads from initial exercise prescription for resistance, speed, and METs.;Short Term: Perform resistance training exercises routinely during rehab and add in resistance training at home;Long Term: Improve cardiorespiratory fitness, muscular endurance and strength as measured by increased METs and functional capacity (6MWT)       Able to understand and use rate of perceived exertion (RPE) scale Yes       Intervention  Provide education and explanation on how to use RPE scale       Expected Outcomes Short Term: Able to use RPE daily in rehab to express subjective intensity level;Long Term:  Able to use RPE to guide intensity level when exercising independently       Able to understand and use Dyspnea scale Yes       Intervention Provide education and  explanation on how to use Dyspnea scale       Expected Outcomes Short Term: Able to use Dyspnea scale daily in rehab to express subjective sense of shortness of breath during exertion;Long Term: Able to use Dyspnea scale to guide intensity level when exercising independently       Knowledge and understanding of Target Heart Rate Range (THRR) Yes       Intervention Provide education and explanation of THRR including how the numbers were predicted and where they are located for reference       Expected Outcomes Short Term: Able to state/look up THRR;Short Term: Able to use daily as guideline for intensity in rehab;Long Term: Able to use THRR to govern intensity when exercising independently       Able to check pulse independently Yes       Intervention Provide education and demonstration on how to check pulse in carotid and radial arteries.;Review the importance of being able to check your own pulse for safety during independent exercise       Expected Outcomes Short Term: Able to explain why pulse checking is important during independent exercise;Long Term: Able to check pulse independently and accurately       Understanding of Exercise Prescription Yes       Intervention Provide education, explanation, and written materials on patient's individual exercise prescription       Expected Outcomes Long Term: Able to explain home exercise prescription to exercise independently;Short Term: Able to explain program exercise prescription                Exercise Goals Re-Evaluation :   Discharge Exercise Prescription (Final Exercise Prescription Changes):  Exercise Prescription Changes - 11/03/20 0900       Response to Exercise   Blood Pressure (Admit) 126/72    Blood Pressure (Exercise) 158/74    Blood Pressure (Exit) 134/66    Heart Rate (Admit) 62 bpm    Heart Rate (Exercise) 97 bpm    Heart Rate (Exit) 60 bpm    Oxygen Saturation (Admit) 97 %    Oxygen Saturation (Exercise) 96 %    Rating  of Perceived Exertion (Exercise) 11    Symptoms chronic hip pain 5/10    Comments walk test results             Nutrition:  Target Goals: Understanding of nutrition guidelines, daily intake of sodium 1500mg , cholesterol 200mg , calories 30% from fat and 7% or less from saturated fats, daily to have 5 or more servings of fruits and vegetables.  Education: All About Nutrition: -Group instruction provided by verbal, written material, interactive activities, discussions, models, and posters to present general guidelines for heart healthy nutrition including fat, fiber, MyPlate, the role of sodium in heart healthy nutrition, utilization of the nutrition label, and utilization of this knowledge for meal planning. Follow up email sent as well. Written material given at graduation. Flowsheet Row Cardiac Rehab from 11/03/2020 in [facility] Cardiac and Pulmonary Rehab  Education need identified 11/03/20       Biometrics:  Pre Biometrics - 11/03/20 1002  Pre Biometrics   Height 5' 8.2" (1.732 m)    Weight 216 lb 14.4 oz (98.4 kg)    BMI (Calculated) 32.8    Single Leg Stand 2.5 seconds              Nutrition Therapy Plan and Nutrition Goals:  Nutrition Therapy & Goals - 11/03/20 1004       Intervention Plan   Intervention Prescribe, educate and counsel regarding individualized specific dietary modifications aiming towards targeted core components such as weight, hypertension, lipid management, diabetes, heart failure and other comorbidities.    Expected Outcomes Short Term Goal: Understand basic principles of dietary content, such as calories, fat, sodium, cholesterol and nutrients.;Short Term Goal: A plan has been developed with personal nutrition goals set during dietitian appointment.;Long Term Goal: Adherence to prescribed nutrition plan.             Nutrition Assessments:  MEDIFICTS Score Key: ?70 Need to make dietary changes  40-70 Heart Healthy Diet ? 40  Therapeutic Level Cholesterol Diet  Flowsheet Row Cardiac Rehab from 11/03/2020 in [facility] Cardiac and Pulmonary Rehab  Picture Your Plate Total Score on Admission 72      Picture Your Plate Scores: <16 Unhealthy dietary pattern with much room for improvement. 41-50 Dietary pattern unlikely to meet recommendations for good health and room for improvement. 51-60 More healthful dietary pattern, with some room for improvement.  >60 Healthy dietary pattern, although there may be some specific behaviors that could be improved.    Nutrition Goals Re-Evaluation:   Nutrition Goals Discharge (Final Nutrition Goals Re-Evaluation):   Psychosocial: Target Goals: Acknowledge presence or absence of significant depression and/or stress, maximize coping skills, provide positive support system. Participant is able to verbalize types and ability to use techniques and skills needed for reducing stress and depression.   Education: Stress, Anxiety, and Depression - Group verbal and visual presentation to define topics covered.  Reviews how body is impacted by stress, anxiety, and depression.  Also discusses healthy ways to reduce stress and to treat/manage anxiety and depression.  Written material given at graduation. Flowsheet Row Cardiac Rehab from 11/03/2020 in [facility] Cardiac and Pulmonary Rehab  Education need identified 11/03/20       Education: Sleep Hygiene -Provides group verbal and written instruction about how sleep can affect your health.  Define sleep hygiene, discuss sleep cycles and impact of sleep habits. Review good sleep hygiene tips.    Initial Review & Psychosocial Screening:  Initial Psych Review & Screening - 10/24/20 1307       Initial Review   Current issues with None Identified      Family Dynamics   Good Support System? Yes   friends     Barriers   Psychosocial barriers to participate in program There are no identifiable barriers or psychosocial needs.;The patient should  benefit from training in stress management and relaxation.      Screening Interventions   Interventions Encouraged to exercise;Provide feedback about the scores to participant;To provide support and resources with identified psychosocial needs    Expected Outcomes Short Term goal: Utilizing psychosocial counselor, staff and physician to assist with identification of specific Stressors or current issues interfering with healing process. Setting desired goal for each stressor or current issue identified.;Long Term Goal: Stressors or current issues are controlled or eliminated.;Short Term goal: Identification and review with participant of any Quality of Life or Depression concerns found by scoring the questionnaire.;Long Term goal: The participant improves quality of Life and  PHQ9 Scores as seen by post scores and/or verbalization of changes             Quality of Life Scores:   Quality of Life - 11/03/20 1003       Quality of Life   Select Quality of Life      Quality of Life Scores   Health/Function Pre 26.4 %    Socioeconomic Pre 30 %    Psych/Spiritual Pre 30 %    Family Pre 30 %    GLOBAL Pre 28.41 %            Scores of 19 and below usually indicate a poorer quality of life in these areas.  A difference of  2-3 points is a clinically meaningful difference.  A difference of 2-3 points in the total score of the Quality of Life Index has been associated with significant improvement in overall quality of life, self-image, physical symptoms, and general health in studies assessing change in quality of life.  PHQ-9: Recent Review Flowsheet Data     Depression screen [facility] 2/9 11/03/2020   Decreased Interest 0   Down, Depressed, Hopeless 0   PHQ - 2 Score 0   Altered sleeping 0   Tired, decreased energy 0   Change in appetite 0   Feeling bad or failure about yourself  0   Trouble concentrating 0   Moving slowly or fidgety/restless 0   Suicidal thoughts 0   PHQ-9 Score 0    Difficult doing work/chores Not difficult at all      Interpretation of Total Score  Total Score Depression Severity:  1-4 = Minimal depression, 5-9 = Mild depression, 10-14 = Moderate depression, 15-19 = Moderately severe depression, 20-27 = Severe depression   Psychosocial Evaluation and Intervention:  Psychosocial Evaluation - 10/24/20 1319       Psychosocial Evaluation & Interventions   Interventions Encouraged to exercise with the program and follow exercise prescription    Comments Mr. Maye reports doing well post MI in May of this year. He is back to working outside which keeps him busy most days. He does not report any stress concerns. He has a lot of friends that make sure he is doing well, four that stop by daily to socialize. 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 He is proud of himself and is motivated to keep working to feel better. He wants to lose some weight and maintain his stamina and independence    Expected Outcomes Short: attend cardiac rehab for education and exercise. Long: develop and maintain positive self care habits.    Continue Psychosocial Services  Follow up required by staff             Psychosocial Re-Evaluation:   Psychosocial Discharge (Final Psychosocial Re-Evaluation):   Vocational Rehabilitation: Provide vocational rehab assistance to qualifying candidates.   Vocational Rehab Evaluation & Intervention:  Vocational Rehab - 10/24/20 1318       Initial Vocational Rehab Evaluation & Intervention   Assessment shows need for Vocational Rehabilitation No             Education: Education Goals: Education classes will be provided on a variety of topics geared toward better understanding of heart health and risk factor modification. Participant will state understanding/return demonstration of topics presented as noted by education test  scores.  Learning Barriers/Preferences:  Learning Barriers/Preferences - 10/24/20 1318       Learning Barriers/Preferences   Learning Barriers None    Learning Preferences None             General Cardiac Education Topics:  AED/CPR: - Group verbal and written instruction with the use of models to demonstrate the basic use of the AED with the basic ABC's of resuscitation.   Anatomy and Cardiac Procedures: - Group verbal and visual presentation and models provide information about basic cardiac anatomy and function. Reviews the testing methods done to diagnose heart disease and the outcomes of the test results. Describes the treatment choices: Medical Management, Angioplasty, or Coronary Bypass Surgery for treating various heart conditions including Myocardial Infarction, Angina, Valve Disease, and Cardiac Arrhythmias.  Written material given at graduation. Flowsheet Row Cardiac Rehab from 11/03/2020 in [facility] Cardiac and Pulmonary Rehab  Education need identified 11/03/20       Medication Safety: - Group verbal and visual instruction to review commonly prescribed medications for heart and lung disease. Reviews the medication, class of the drug, and side effects. Includes the steps to properly store meds and maintain the prescription regimen.  Written material given at graduation.   Intimacy: - Group verbal instruction through game format to discuss how heart and lung disease can affect sexual intimacy. Written material given at graduation..   Know Your Numbers and Heart Failure: - Group verbal and visual instruction to discuss disease risk factors for cardiac and pulmonary disease and treatment options.  Reviews associated critical values for Overweight/Obesity, Hypertension, Cholesterol, and Diabetes.  Discusses basics of heart failure: signs/symptoms and treatments.  Introduces Heart Failure Zone chart for action plan for heart failure.  Written material given at  graduation.   Infection Prevention: - Provides verbal and written material to individual with discussion of infection control including proper hand washing and proper equipment cleaning during exercise session. Flowsheet Row Cardiac Rehab from 11/03/2020 in [facility] Cardiac and Pulmonary Rehab  Date 11/03/20  Educator [facility]  Instruction Review Code 1- Verbalizes Understanding       Falls Prevention: - Provides verbal and written material to individual with discussion of falls prevention and safety. Flowsheet Row Cardiac Rehab from 11/03/2020 in [facility] Cardiac and Pulmonary Rehab  Date 11/03/20  Educator [facility]  Instruction Review Code 1- Verbalizes Understanding       Other: -Provides group and verbal instruction on various topics (see comments)   Knowledge Questionnaire Score:  Knowledge Questionnaire Score - 11/03/20 1004       Knowledge Questionnaire Score   Pre Score 22/26             Core Components/Risk Factors/Patient Goals at Admission:  Personal Goals and Risk Factors at Admission - 11/03/20 1004       Core Components/Risk Factors/Patient Goals on Admission    Weight Management Yes;Weight Loss;Obesity    Intervention Weight Management: Develop a combined nutrition and exercise program designed to reach desired caloric intake, while maintaining appropriate intake of nutrient and fiber, sodium and fats, and appropriate energy expenditure required for the weight goal.;Weight Management: Provide education and appropriate resources to help participant work on and attain dietary goals.;Weight Management/Obesity: Establish reasonable short term and long term weight goals.;Obesity: Provide education and appropriate resources to help participant work on and attain dietary goals.    Admit Weight 216 lb 14.4 oz (98.4 kg)    Goal Weight: Short Term 210 lb (95.3 kg)    Goal Weight: Long Term 170 lb (77.1 kg)  Expected Outcomes Long Term: Adherence to nutrition and physical  activity/exercise program aimed toward attainment of established weight goal;Short Term: Continue to assess and modify interventions until short term weight is achieved;Understanding recommendations for meals to include 15-35% energy as protein, 25-35% energy from fat, 35-60% energy from carbohydrates, less than 200mg  of dietary cholesterol, 20-35 gm of total fiber daily;Weight Loss: Understanding of general recommendations for a balanced deficit meal plan, which promotes 1-2 lb weight loss per week and includes a negative energy balance of [phone] kcal/d;Understanding of distribution of calorie intake throughout the day with the consumption of 4-5 meals/snacks    Diabetes Yes    Intervention Provide education about signs/symptoms and action to take for hypo/hyperglycemia.;Provide education about proper nutrition, including hydration, and aerobic/resistive exercise prescription along with prescribed medications to achieve blood glucose in normal ranges: Fasting glucose 65-99 mg/dL    Expected Outcomes Short Term: Participant verbalizes understanding of the signs/symptoms and immediate care of hyper/hypoglycemia, proper foot care and importance of medication, aerobic/resistive exercise and nutrition plan for blood glucose control.;Long Term: Attainment of HbA1C < 7%.    Heart Failure Yes    Intervention Provide a combined exercise and nutrition program that is supplemented with education, support and counseling about heart failure. Directed toward relieving symptoms such as shortness of breath, decreased exercise tolerance, and extremity edema.    Expected Outcomes Improve functional capacity of life;Short term: Attendance in program 2-3 days a week with increased exercise capacity. Reported lower sodium intake. Reported increased fruit and vegetable intake. Reports medication compliance.;Short term: Daily weights obtained and reported for increase. Utilizing diuretic protocols set by physician.;Long term:  Adoption of self-care skills and reduction of barriers for early signs and symptoms recognition and intervention leading to self-care maintenance.    Hypertension Yes    Intervention Provide education on lifestyle modifcations including regular physical activity/exercise, weight management, moderate sodium restriction and increased consumption of fresh fruit, vegetables, and low fat dairy, alcohol moderation, and smoking cessation.;Monitor prescription use compliance.    Expected Outcomes Short Term: Continued assessment and intervention until BP is < 140/68mm HG in hypertensive participants. < 130/74mm HG in hypertensive participants with diabetes, heart failure or chronic kidney disease.;Long Term: Maintenance of blood pressure at goal levels.    Lipids Yes    Intervention Provide education and support for participant on nutrition & aerobic/resistive exercise along with prescribed medications to achieve LDL 70mg , HDL >40mg .    Expected Outcomes Short Term: Participant states understanding of desired cholesterol values and is compliant with medications prescribed. Participant is following exercise prescription and nutrition guidelines.;Long Term: Cholesterol controlled with medications as prescribed, with individualized exercise RX and with personalized nutrition plan. Value goals: LDL < 70mg , HDL > 40 mg.             Education:Diabetes - Individual verbal and written instruction to review signs/symptoms of diabetes, desired ranges of glucose level fasting, after meals and with exercise. Acknowledge that pre and post exercise glucose checks will be done for 3 sessions at entry of program. [place] from 11/03/2020 in [facility] Cardiac and Pulmonary Rehab  Date 10/24/20  Educator [facility]  Instruction Review Code 1- Verbalizes Understanding       Core Components/Risk Factors/Patient Goals Review:    Core Components/Risk Factors/Patient Goals at Discharge (Final Review):    ITP  Comments:  ITP Comments     Row Name 10/24/20 1322 11/03/20 0948         ITP Comments Initial telephone orientation  completed. Diagnosis can be found in [facility] 9/29. EP orientation scheduled for Monday 10/17 at 8am. Completed 6MWT and gym orientation. Initial ITP created and sent for review to Dr. Emily Filbert, Medical Director.               Comments: Initial ITP

## 2020-11-05 ENCOUNTER — Other Ambulatory Visit: Payer: Self-pay

## 2020-11-05 DIAGNOSIS — I213 ST elevation (STEMI) myocardial infarction of unspecified site: Secondary | ICD-10-CM

## 2020-11-05 DIAGNOSIS — Z955 Presence of coronary angioplasty implant and graft: Secondary | ICD-10-CM

## 2020-11-05 LAB — GLUCOSE, CAPILLARY
Glucose-Capillary: 206 mg/dL — ABNORMAL HIGH (ref 70–99)
Glucose-Capillary: 182 mg/dL — ABNORMAL HIGH (ref 70–99)

## 2020-11-05 NOTE — Progress Notes (Signed)
Daily Session Note  Patient Details  Name: Joshua Ferrell. MRN: [id number] Date of Birth: May 04, 1945 Referring Provider:   Flowsheet Row Cardiac Rehab from 11/03/2020 in [facility] Cardiac and Pulmonary Rehab  Referring Provider Isaias Cowman MD       Encounter Date: 11/05/2020  Check In:  Session Check In - 11/05/20 0923       Check-In   Supervising physician immediately available to respond to emergencies See telemetry face sheet for immediately available ER MD    Location [facility]-Cardiac & Pulmonary Rehab    Staff Present Birdie Sons, MPA, Elveria Rising, BA, ACSM CEP, Exercise Physiologist;Joseph [place], RCP,RRT,BSRT;Melissa [place], RDN, LDN    Virtual Visit No    Medication changes reported     No    Fall or balance concerns reported    No    Warm-up and Cool-down Performed on first and last piece of equipment    Resistance Training Performed Yes    VAD Patient? No    PAD/SET Patient? No      Pain Assessment   Currently in Pain? No/denies                Social History   Tobacco Use  Smoking Status Former   Packs/day: 0.00   Years: 0.00   Pack years: 0.00   Types: Cigarettes  Smokeless Tobacco Former   Types: Chew   Quit date: 1990    Goals Met:  Independence with exercise equipment Exercise tolerated well No report of concerns or symptoms today Strength training completed today  Goals Unmet:  Not Applicable  Comments: First full day of exercise!  Patient was oriented to gym and equipment including functions, settings, policies, and procedures.  Patient's individual exercise prescription and treatment plan were reviewed.  All starting workloads were established based on the results of the 6 minute walk test done at initial orientation visit.  The plan for exercise progression was also introduced and progression will be customized based on patient's performance and goals.     Dr. Emily Filbert is Medical Director for [place].  Dr. Ottie Glazier is Medical Director for [facility] Pulmonary Rehabilitation.

## 2020-11-07 ENCOUNTER — Other Ambulatory Visit: Payer: Self-pay

## 2020-11-07 ENCOUNTER — Encounter: Payer: PPO | Admitting: *Deleted

## 2020-11-07 DIAGNOSIS — I213 ST elevation (STEMI) myocardial infarction of unspecified site: Secondary | ICD-10-CM | POA: Diagnosis not present

## 2020-11-07 DIAGNOSIS — Z955 Presence of coronary angioplasty implant and graft: Secondary | ICD-10-CM

## 2020-11-07 LAB — GLUCOSE, CAPILLARY
Glucose-Capillary: 118 mg/dL — ABNORMAL HIGH (ref 70–99)
Glucose-Capillary: 77 mg/dL (ref 70–99)
Glucose-Capillary: 101 mg/dL — ABNORMAL HIGH (ref 70–99)

## 2020-11-07 NOTE — Progress Notes (Signed)
Daily Session Note  Patient Details  Name: Joshua Ferrell. MRN: [id number] Date of Birth: 22-Dec-1945 Referring Provider:   Flowsheet Row Cardiac Rehab from 11/03/2020 in [facility] Cardiac and Pulmonary Rehab  Referring Provider Isaias Cowman MD       Encounter Date: 11/07/2020  Check In:  Session Check In - 11/07/20 0936       Check-In   Supervising physician immediately available to respond to emergencies See telemetry face sheet for immediately available ER MD    Location [facility]-Cardiac & Pulmonary Rehab    Staff Present Nyoka Cowden, RN, BSN, Ardeth Sportsman, RDN, LDN;Susanne Bice, RN, BSN, CCRP    Virtual Visit No    Medication changes reported     No    Fall or balance concerns reported    No    Tobacco Cessation No Change    Warm-up and Cool-down Performed on first and last piece of equipment    Resistance Training Performed Yes    VAD Patient? No      Pain Assessment   Currently in Pain? No/denies                Social History   Tobacco Use  Smoking Status Former   Packs/day: 0.00   Years: 0.00   Pack years: 0.00   Types: Cigarettes  Smokeless Tobacco Former   Types: Chew   Quit date: 1990    Goals Met:  Independence with exercise equipment Exercise tolerated well No report of concerns or symptoms today  Goals Unmet:  Not Applicable  Comments: .exgoo   Dr. Emily Filbert is Medical Director for [place].  Dr. Ottie Glazier is Medical Director for [facility] Pulmonary Rehabilitation.

## 2020-11-10 ENCOUNTER — Other Ambulatory Visit: Payer: Self-pay

## 2020-11-10 ENCOUNTER — Encounter: Payer: PPO | Admitting: *Deleted

## 2020-11-10 DIAGNOSIS — I213 ST elevation (STEMI) myocardial infarction of unspecified site: Secondary | ICD-10-CM

## 2020-11-10 DIAGNOSIS — Z955 Presence of coronary angioplasty implant and graft: Secondary | ICD-10-CM

## 2020-11-10 LAB — GLUCOSE, CAPILLARY
Glucose-Capillary: 123 mg/dL — ABNORMAL HIGH (ref 70–99)
Glucose-Capillary: 90 mg/dL (ref 70–99)

## 2020-11-10 NOTE — Progress Notes (Signed)
Daily Session Note  Patient Details  Name: Joshua Ferrell. MRN: [id number] Date of Birth: Sep 04, 1945 Referring Provider:   Flowsheet Row Cardiac Rehab from 11/03/2020 in [facility] Cardiac and Pulmonary Rehab  Referring Provider Isaias Cowman MD       Encounter Date: 11/10/2020  Check In:  Session Check In - 11/10/20 0935       Check-In   Supervising physician immediately available to respond to emergencies See telemetry face sheet for immediately available ER MD    Location [facility]-Cardiac & Pulmonary Rehab    Staff Present Heath Lark, RN, BSN, Laveda Norman, BS, ACSM CEP, Exercise Physiologist;Amanda Oletta Darter, [place], ACSM CEP, Exercise Physiologist    Virtual Visit No    Medication changes reported     No    Fall or balance concerns reported    No    Warm-up and Cool-down Performed on first and last piece of equipment    Resistance Training Performed Yes    VAD Patient? No    PAD/SET Patient? No      Pain Assessment   Currently in Pain? No/denies                Social History   Tobacco Use  Smoking Status Former   Packs/day: 0.00   Years: 0.00   Pack years: 0.00   Types: Cigarettes  Smokeless Tobacco Former   Types: Chew   Quit date: 1990    Goals Met:  Exercise tolerated well No report of concerns or symptoms today  Goals Unmet:  Not Applicable  Comments: Pt able to follow exercise prescription today without complaint.  Will continue to monitor for progression.    Dr. Emily Filbert is Medical Director for [place].  Dr. Ottie Glazier is Medical Director for [facility] Pulmonary Rehabilitation.

## 2020-11-10 NOTE — Progress Notes (Signed)
Completed initial RD consultation ?

## 2020-11-12 ENCOUNTER — Other Ambulatory Visit: Payer: Self-pay

## 2020-11-12 DIAGNOSIS — I213 ST elevation (STEMI) myocardial infarction of unspecified site: Secondary | ICD-10-CM

## 2020-11-12 DIAGNOSIS — Z955 Presence of coronary angioplasty implant and graft: Secondary | ICD-10-CM

## 2020-11-12 NOTE — Progress Notes (Signed)
Daily Session Note  Patient Details  Name: Joshua Ferrell. MRN: [id number] Date of Birth: [birth year] Referring Provider:   Flowsheet Row Cardiac Rehab from 11/03/2020 in [facility] Cardiac and Pulmonary Rehab  Referring Provider Isaias Cowman MD       Encounter Date: 11/12/2020  Check In:  Session Check In - 11/12/20 0925       Check-In   Supervising physician immediately available to respond to emergencies See telemetry face sheet for immediately available ER MD    Location [facility]-Cardiac & Pulmonary Rehab    Staff Present Birdie Sons, MPA, Elveria Rising, BA, ACSM CEP, Exercise Physiologist;Joseph Tessie Fass, [place]    Virtual Visit No    Medication changes reported     No    Fall or balance concerns reported    No    Tobacco Cessation No Change    Warm-up and Cool-down Performed on first and last piece of equipment    Resistance Training Performed Yes    VAD Patient? No    PAD/SET Patient? No      Pain Assessment   Currently in Pain? No/denies                Social History   Tobacco Use  Smoking Status Former   Packs/day: 0.00   Years: 0.00   Pack years: 0.00   Types: Cigarettes  Smokeless Tobacco Former   Types: Chew   Quit date: 1990    Goals Met:  Independence with exercise equipment Exercise tolerated well No report of concerns or symptoms today Strength training completed today  Goals Unmet:  Not Applicable  Comments: Pt able to follow exercise prescription today without complaint.  Will continue to monitor for progression.    Dr. Emily Filbert is Medical Director for [place].  Dr. Ottie Glazier is Medical Director for [facility] Pulmonary Rehabilitation.

## 2020-11-14 ENCOUNTER — Other Ambulatory Visit: Payer: Self-pay

## 2020-11-14 ENCOUNTER — Encounter: Payer: PPO | Admitting: *Deleted

## 2020-11-14 DIAGNOSIS — Z955 Presence of coronary angioplasty implant and graft: Secondary | ICD-10-CM

## 2020-11-14 DIAGNOSIS — I213 ST elevation (STEMI) myocardial infarction of unspecified site: Secondary | ICD-10-CM

## 2020-11-14 NOTE — Progress Notes (Signed)
Daily Session Note  Patient Details  Name: Joshua Ferrell. MRN: [id number] Date of Birth: 1946/01/15 Referring Provider:   Flowsheet Row Cardiac Rehab from 11/03/2020 in [facility] Cardiac and Pulmonary Rehab  Referring Provider Isaias Cowman MD       Encounter Date: 11/14/2020  Check In:  Session Check In - 11/14/20 4098       Check-In   Supervising physician immediately available to respond to emergencies See telemetry face sheet for immediately available ER MD    Location [facility]-Cardiac & Pulmonary Rehab    Staff Present Renita Papa, RN BSN;Joseph [place], RCP,RRT,BSRT;Jessica [place], [place], RCEP, CCRP, CCET    Virtual Visit No    Medication changes reported     No    Fall or balance concerns reported    No    Warm-up and Cool-down Performed on first and last piece of equipment    Resistance Training Performed Yes    VAD Patient? No    PAD/SET Patient? No      Pain Assessment   Currently in Pain? No/denies                Social History   Tobacco Use  Smoking Status Former   Packs/day: 0.00   Years: 0.00   Pack years: 0.00   Types: Cigarettes  Smokeless Tobacco Former   Types: Chew   Quit date: 1990    Goals Met:  Independence with exercise equipment Exercise tolerated well No report of concerns or symptoms today Strength training completed today  Goals Unmet:  Not Applicable  Comments: Pt able to follow exercise prescription today without complaint.  Will continue to monitor for progression.    Dr. Emily Filbert is Medical Director for [place].  Dr. Ottie Glazier is Medical Director for [facility] Pulmonary Rehabilitation.

## 2020-11-17 ENCOUNTER — Encounter: Payer: PPO | Admitting: *Deleted

## 2020-11-17 ENCOUNTER — Other Ambulatory Visit: Payer: Self-pay

## 2020-11-17 DIAGNOSIS — I213 ST elevation (STEMI) myocardial infarction of unspecified site: Secondary | ICD-10-CM

## 2020-11-17 DIAGNOSIS — Z955 Presence of coronary angioplasty implant and graft: Secondary | ICD-10-CM

## 2020-11-17 NOTE — Progress Notes (Signed)
Daily Session Note  Patient Details  Name: Joshua Ferrell. MRN: [id number] Date of Birth: [birth year] Referring Provider:   Flowsheet Row Cardiac Rehab from 11/03/2020 in [facility] Cardiac and Pulmonary Rehab  Referring Provider Isaias Cowman MD       Encounter Date: 11/17/2020  Check In:  Session Check In - 11/17/20 0930       Check-In   Supervising physician immediately available to respond to emergencies See telemetry face sheet for immediately available ER MD    Location [facility]-Cardiac & Pulmonary Rehab    Staff Present Heath Lark, RN, BSN, CCRP;Laureen Owens Shark, BS, RRT, CPFT;Kelly Amedeo Plenty, BS, ACSM CEP, Exercise Physiologist    Virtual Visit No    Medication changes reported     No    Fall or balance concerns reported    No    Warm-up and Cool-down Performed on first and last piece of equipment    Resistance Training Performed Yes    VAD Patient? No    PAD/SET Patient? No      Pain Assessment   Currently in Pain? No/denies                Social History   Tobacco Use  Smoking Status Former   Packs/day: 0.00   Years: 0.00   Pack years: 0.00   Types: Cigarettes  Smokeless Tobacco Former   Types: Chew   Quit date: 1990    Goals Met:  Independence with exercise equipment Exercise tolerated well No report of concerns or symptoms today  Goals Unmet:  Not Applicable  Comments: Pt able to follow exercise prescription today without complaint.  Will continue to monitor for progression.    Dr. Emily Filbert is Medical Director for [place].  Dr. Ottie Glazier is Medical Director for [facility] Pulmonary Rehabilitation.

## 2020-11-19 ENCOUNTER — Encounter: Payer: Self-pay | Admitting: *Deleted

## 2020-11-19 ENCOUNTER — Encounter: Payer: PPO | Attending: Cardiology

## 2020-11-19 ENCOUNTER — Other Ambulatory Visit: Payer: Self-pay

## 2020-11-19 DIAGNOSIS — Z955 Presence of coronary angioplasty implant and graft: Secondary | ICD-10-CM

## 2020-11-19 DIAGNOSIS — I213 ST elevation (STEMI) myocardial infarction of unspecified site: Secondary | ICD-10-CM

## 2020-11-19 NOTE — Progress Notes (Signed)
Cardiac Individual Treatment Plan  Patient Details  Name: Joshua Ferrell. MRN: [id number] Referring Provider:   Flowsheet Row Cardiac Rehab from 11/03/2020 in [facility] Cardiac and Pulmonary Rehab  Referring Provider Isaias Cowman MD       Initial Encounter Date:  Flowsheet Row Cardiac Rehab from 11/03/2020 in [facility] Cardiac and Pulmonary Rehab  Date 11/03/20       Visit Diagnosis: ST elevation myocardial infarction (STEMI), unspecified artery [facility])  Status post coronary artery stent placement  Patient's Home Medications on Admission:  Current Outpatient Medications:    acetaminophen (TYLENOL) 650 MG CR tablet, Take by mouth. (Patient not taking: Reported on 10/24/2020), Disp: , Rfl:    amLODipine (NORVASC) 5 MG tablet, Take 5 mg by mouth daily., Disp: , Rfl:    aspirin EC 81 MG tablet, Take 81 mg by mouth daily., Disp: , Rfl:    clopidogrel (PLAVIX) 75 MG tablet, Take 1 tablet (75 mg total) by mouth daily with breakfast., Disp: 30 tablet, Rfl: 1   ezetimibe (ZETIA) 10 MG tablet, Take 1 tablet (10 mg total) by mouth daily., Disp: 30 tablet, Rfl: 1   glimepiride (AMARYL) 2 MG tablet, Take 2 mg by mouth daily with breakfast., Disp: , Rfl:    Investigational - Study Medication, Take 14 mg by mouth daily before breakfast. Study name: semaglutide 14 mg or placebo, Disp: , Rfl:    metFORMIN (GLUCOPHAGE) 500 MG tablet, Take 1,000 mg by mouth 2 (two) times daily with a meal., Disp: , Rfl:    metoprolol succinate (TOPROL-XL) 50 MG 24 hr tablet, Take 1 tablet (50 mg total) by mouth daily. Take with or immediately following a meal., Disp: 30 tablet, Rfl: 1   nitroGLYCERIN (NITROSTAT) 0.4 MG SL tablet, Place 0.4 mg under the tongue every 5 (five) minutes as needed for chest pain., Disp: , Rfl:    valsartan-hydrochlorothiazide (DIOVAN-HCT) 160-12.5 MG tablet, Take 1 tablet by mouth daily., Disp: , Rfl:   Past Medical History: Past Medical History:  Diagnosis Date   Arthritis     Coronary artery disease    COVID-19 12/2018   Diabetes mellitus without complication (HCC)    Dyspnea    with exertion   GERD (gastroesophageal reflux disease)    minimal episodes   Headache(784.0)    Heart disease    History of blood transfusion 1990   internal injuries after a fall requiring blood   History of colonic polyps    Hyperglycemia    Hyperlipidemia    Hypertension    Myocardial infarction ([place]) 2003   PONV (postoperative nausea and vomiting)    one time only (surgery in 1990)    Tobacco Use: Social History   Tobacco Use  Smoking Status Former   Packs/day: 0.00   Years: 0.00   Pack years: 0.00   Types: Cigarettes  Smokeless Tobacco Former   Types: Chew   Quit date: 1990    Labs: 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   Exercise Prescription Goal: Initial exercise prescription builds to 30-45 minutes a day of aerobic activity, 2-3 days per week.  Home exercise guidelines will be given to patient during  program as part of exercise prescription that the participant will acknowledge.   Education: Aerobic Exercise: - Group verbal and visual presentation on the components of exercise prescription. Introduces F.I.T.T principle from ACSM for exercise prescriptions.  Reviews F.I.T.T. principles of aerobic exercise including progression. Written material given at graduation. Flowsheet Row Cardiac Rehab from 11/12/2020 in [facility] Cardiac and Pulmonary Rehab  Education need identified 11/03/20  Date 11/12/20  Educator [facility]  Instruction Review Code 1- Verbalizes Understanding       Education: Resistance Exercise: - Group verbal and visual presentation on the  components of exercise prescription. Introduces F.I.T.T principle from ACSM for exercise prescriptions  Reviews F.I.T.T. principles of resistance exercise including progression. Written material given at graduation.    Education: Exercise & Equipment Safety: - Individual verbal instruction and demonstration of equipment use and safety with use of the equipment. Flowsheet Row Cardiac Rehab from 11/12/2020 in [facility] Cardiac and Pulmonary Rehab  Date 11/03/20  Educator [facility]  Instruction Review Code 1- Verbalizes Understanding       Education: Exercise Physiology & General Exercise Guidelines: - Group verbal and written instruction with models to review the exercise physiology of the cardiovascular system and associated critical values. Provides general exercise guidelines with specific guidelines to those with heart or lung disease.    Education: Flexibility, Balance, Mind/Body Relaxation: - Group verbal and visual presentation with interactive activity on the components of exercise prescription. Introduces F.I.T.T principle from ACSM for exercise prescriptions. Reviews F.I.T.T. principles of flexibility and balance exercise training including progression. Also discusses the mind body connection.  Reviews various relaxation techniques to help reduce and manage stress (i.e. Deep breathing, progressive muscle relaxation, and visualization). Balance handout provided to take home. Written material given at graduation.   Activity Barriers & Risk Stratification:  Activity Barriers & Cardiac Risk Stratification - 10/24/20 1304       Activity Barriers & Cardiac Risk Stratification   Activity Barriers Arthritis;Joint Problems    Cardiac Risk Stratification High             6 Minute Walk:  6 Minute Walk     Row Name 11/03/20 0948         6 Minute Walk   Phase Initial     Distance 1220 feet     Walk Time 6 minutes     # of Rest Breaks 0     MPH 2.31     METS 2.24     RPE 11     VO2  Peak 7.85     Symptoms Yes (comment)     Comments chronic hip pain 5/10     Resting HR 62 bpm     Resting BP 126/72     Resting Oxygen Saturation  97 %     Exercise Oxygen Saturation  during 6 min walk 96 %     Max Ex. HR 97 bpm     Max Ex. BP 158/74     2 Minute Post BP 134/66              Oxygen Initial Assessment:   Oxygen Re-Evaluation:   Oxygen Discharge (Final Oxygen Re-Evaluation):   Initial Exercise Prescription:  Initial Exercise Prescription - 11/03/20 0900       Date of Initial Exercise RX and Referring Provider   Date 11/03/20    Referring Provider Isaias Cowman MD      Oxygen   Maintain Oxygen Saturation 88% or higher      Treadmill  MPH 2    Grade 0.5    Minutes 15    METs 2.67      Recumbant Bike   Level 2    RPM 50    Watts 10    Minutes 15    METs 2      REL-XR   Level 2    Speed 50    Minutes 15    METs 2      Prescription Details   Frequency (times per week) 3    Duration Progress to 30 minutes of continuous aerobic without signs/symptoms of physical distress      Intensity   THRR 40-80% of Max Heartrate 94-127    Ratings of Perceived Exertion 11-13    Perceived Dyspnea 0-4      Progression   Progression Continue to progress workloads to maintain intensity without signs/symptoms of physical distress.      Resistance Training   Training Prescription Yes    Weight 4 lb    Reps 10-15             Perform Capillary Blood Glucose checks as needed.  Exercise Prescription Changes:   Exercise Prescription Changes     Row Name 11/03/20 0900 11/17/20 1000           Response to Exercise   Blood Pressure (Admit) 126/72 122/64      Blood Pressure (Exercise) 158/74 142/74      Blood Pressure (Exit) 134/66 120/64      Heart Rate (Admit) 62 bpm 70 bpm      Heart Rate (Exercise) 97 bpm 95 bpm      Heart Rate (Exit) 60 bpm 69 bpm      Oxygen Saturation (Admit) 97 % --      Oxygen Saturation (Exercise) 96 % --       Rating of Perceived Exertion (Exercise) 11 12      Symptoms chronic hip pain 5/10 none      Comments walk test results 2nd full week of exercise      Duration -- Progress to 30 minutes of  aerobic without signs/symptoms of physical distress      Intensity -- THRR unchanged        Progression   Progression -- Continue to progress workloads to maintain intensity without signs/symptoms of physical distress.      Average METs -- 3.11        Resistance Training   Training Prescription -- Yes      Weight -- 4 lb      Reps -- 10-15        Interval Training   Interval Training -- No        Treadmill   MPH -- 2.5      Grade -- 0.5      Minutes -- 15      METs -- 3.09        Recumbant Bike   Level -- 2      Minutes -- 15      METs -- 2.31        REL-XR   Level -- 1      Minutes -- 15      METs -- 3.2               Exercise Comments:   Exercise Comments     Row Name 11/05/20 4010           Exercise Comments First full day of exercise!  Patient  was oriented to gym and equipment including functions, settings, policies, and procedures.  Patient's individual exercise prescription and treatment plan were reviewed.  All starting workloads were established based on the results of the 6 minute walk test done at initial orientation visit.  The plan for exercise progression was also introduced and progression will be customized based on patient's performance and goals.                Exercise Goals and Review:   Exercise Goals     Row Name 11/03/20 1002             Exercise Goals   Increase Physical Activity Yes       Intervention Provide advice, education, support and counseling about physical activity/exercise needs.;Develop an individualized exercise prescription for aerobic and resistive training based on initial evaluation findings, risk stratification, comorbidities and participant's personal goals.       Expected Outcomes Short Term: Attend rehab on a  regular basis to increase amount of physical activity.;Long Term: Add in home exercise to make exercise part of routine and to increase amount of physical activity.;Long Term: Exercising regularly at least 3-5 days a week.       Increase Strength and Stamina Yes       Intervention Provide advice, education, support and counseling about physical activity/exercise needs.;Develop an individualized exercise prescription for aerobic and resistive training based on initial evaluation findings, risk stratification, comorbidities and participant's personal goals.       Expected Outcomes Short Term: Increase workloads from initial exercise prescription for resistance, speed, and METs.;Short Term: Perform resistance training exercises routinely during rehab and add in resistance training at home;Long Term: Improve cardiorespiratory fitness, muscular endurance and strength as measured by increased METs and functional capacity (6MWT)       Able to understand and use rate of perceived exertion (RPE) scale Yes       Intervention Provide education and explanation on how to use RPE scale       Expected Outcomes Short Term: Able to use RPE daily in rehab to express subjective intensity level;Long Term:  Able to use RPE to guide intensity level when exercising independently       Able to understand and use Dyspnea scale Yes       Intervention Provide education and explanation on how to use Dyspnea scale       Expected Outcomes Short Term: Able to use Dyspnea scale daily in rehab to express subjective sense of shortness of breath during exertion;Long Term: Able to use Dyspnea scale to guide intensity level when exercising independently       Knowledge and understanding of Target Heart Rate Range (THRR) Yes       Intervention Provide education and explanation of THRR including how the numbers were predicted and where they are located for reference       Expected Outcomes Short Term: Able to state/look up THRR;Short Term:  Able to use daily as guideline for intensity in rehab;Long Term: Able to use THRR to govern intensity when exercising independently       Able to check pulse independently Yes       Intervention Provide education and demonstration on how to check pulse in carotid and radial arteries.;Review the importance of being able to check your own pulse for safety during independent exercise       Expected Outcomes Short Term: Able to explain why pulse checking is important during independent exercise;Long Term: Able to check pulse  independently and accurately       Understanding of Exercise Prescription Yes       Intervention Provide education, explanation, and written materials on patient's individual exercise prescription       Expected Outcomes Long Term: Able to explain home exercise prescription to exercise independently;Short Term: Able to explain program exercise prescription                Exercise Goals Re-Evaluation :  Exercise Goals Re-Evaluation     [place] Name 11/05/20 0925 11/17/20 1017           Exercise Goal Re-Evaluation   Exercise Goals Review Increase Physical Activity;Understanding of Exercise Prescription;Knowledge and understanding of Target Heart Rate Range (THRR);Able to understand and use rate of perceived exertion (RPE) scale;Increase Strength and Stamina;Able to understand and use Dyspnea scale;Able to check pulse independently Increase Physical Activity;Increase Strength and Stamina      Comments Reviewed RPE and dyspnea scales, THR and program prescription with pt today.  Pt voiced understanding and was given a copy of goals to take home. Asiah is doing well for the first couple of weeks that he has been here. He is tolerating his exercise prescription well and is hitting his [place] so far. Will continue to monitor as he progresses through the program.      Expected Outcomes Short: Use RPE daily to regulate intensity. Long: Follow program prescription in THR. Short: Continue  current exercise prescription and start to increase loads as tolerated Long: Increase overall MET level               Discharge Exercise Prescription (Final Exercise Prescription Changes):  Exercise Prescription Changes - 11/17/20 1000       Response to Exercise   Blood Pressure (Admit) 122/64    Blood Pressure (Exercise) 142/74    Blood Pressure (Exit) 120/64    Heart Rate (Admit) 70 bpm    Heart Rate (Exercise) 95 bpm    Heart Rate (Exit) 69 bpm    Rating of Perceived Exertion (Exercise) 12    Symptoms none    Comments 2nd full week of exercise    Duration Progress to 30 minutes of  aerobic without signs/symptoms of physical distress    Intensity THRR unchanged      Progression   Progression Continue to progress workloads to maintain intensity without signs/symptoms of physical distress.    Average METs 3.11      Resistance Training   Training Prescription Yes    Weight 4 lb    Reps 10-15      Interval Training   Interval Training No      Treadmill   MPH 2.5    Grade 0.5    Minutes 15    METs 3.09      Recumbant Bike   Level 2    Minutes 15    METs 2.31      REL-XR   Level 1    Minutes 15    METs 3.2             Nutrition:  Target Goals: Understanding of nutrition guidelines, daily intake of sodium <1528m, cholesterol <2031m calories 30% from fat and 7% or less from saturated fats, daily to have 5 or more servings of fruits and vegetables.  Education: All About Nutrition: -Group instruction provided by verbal, written material, interactive activities, discussions, models, and posters to present general guidelines for heart healthy nutrition including fat, fiber, MyPlate, the role of sodium in heart  healthy nutrition, utilization of the nutrition label, and utilization of this knowledge for meal planning. Follow up email sent as well. Written material given at graduation. Flowsheet Row Cardiac Rehab from 11/12/2020 in [facility] Cardiac and Pulmonary Rehab   Education need identified 11/03/20       Biometrics:  Pre Biometrics - 11/03/20 1002       Pre Biometrics   Height 5' 8.2" (1.732 m)    Weight 216 lb 14.4 oz (98.4 kg)    BMI (Calculated) 32.8    Single Leg Stand 2.5 seconds              Nutrition Therapy Plan and Nutrition Goals:  Nutrition Therapy & Goals - 11/10/20 0845       Nutrition Therapy   Drug/Food Interactions Food/Disease    Protein (specify units) 75g    Fiber 30 grams    Whole Grain Foods 3 servings    Saturated Fats 12 max. grams    Fruits and Vegetables 8 servings/day    Sodium 1.5 grams      Personal Nutrition Goals   Nutrition Goal ST: 2-4 CHO servings at meals (make sure to have at least 2 CHO servings for breakfast to avoid BG lows during exercise) LT: Include at least 5 fruit/vegetable servings per day, limit processed meat to <3x/week    Comments On investigational study medication: semaglutide 14 mg or placebo, Historical Med. Also on Metformin. A1C 5.8 per patient last month. B: may or may not cook - bacon or sausage with eggs and sometimes toast (whole wheat bread). Ham and egg on bun 1x/week. Cereal once in a while. Always coffee (black) S: cookie with maybe a diet coke L: Ham or [place] sandwich. Cooks chicken wings or leftovers. D: grills chicken, hamburger eveyr once in a while, fish. Green beans, sugar peas, corn, cauliflower, cabbage, collards, "whatever strikes me". Drinks: 1-3 beers before dinner and water. Discussed heart healthy eating and focused on diabetes friendly eating due to time constraints. Patient will need review/follow up education on general heart healhty eating and meal planning.      Intervention Plan   Intervention Prescribe, educate and counsel regarding individualized specific dietary modifications aiming towards targeted core components such as weight, hypertension, lipid management, diabetes, heart failure and other comorbidities.    Expected Outcomes Short Term Goal:  Understand basic principles of dietary content, such as calories, fat, sodium, cholesterol and nutrients.;Short Term Goal: A plan has been developed with personal nutrition goals set during dietitian appointment.;Long Term Goal: Adherence to prescribed nutrition plan.             Nutrition Assessments:  MEDIFICTS Score Key: ?70 Need to make dietary changes  40-70 Heart Healthy Diet ? 40 Therapeutic Level Cholesterol Diet  Flowsheet Row Cardiac Rehab from 11/03/2020 in [facility] Cardiac and Pulmonary Rehab  Picture Your Plate Total Score on Admission 72      Picture Your Plate Scores: <00 Unhealthy dietary pattern with much room for improvement. 41-50 Dietary pattern unlikely to meet recommendations for good health and room for improvement. 51-60 More healthful dietary pattern, with some room for improvement.  >60 Healthy dietary pattern, although there may be some specific behaviors that could be improved.    Nutrition Goals Re-Evaluation:   Nutrition Goals Discharge (Final Nutrition Goals Re-Evaluation):   Psychosocial: Target Goals: Acknowledge presence or absence of significant depression and/or stress, maximize coping skills, provide positive support system. Participant is able to verbalize types and ability to use techniques  and skills needed for reducing stress and depression.   Education: Stress, Anxiety, and Depression - Group verbal and visual presentation to define topics covered.  Reviews how body is impacted by stress, anxiety, and depression.  Also discusses healthy ways to reduce stress and to treat/manage anxiety and depression.  Written material given at graduation. Flowsheet Row Cardiac Rehab from 11/12/2020 in [facility] Cardiac and Pulmonary Rehab  Education need identified 11/03/20       Education: Sleep Hygiene -Provides group verbal and written instruction about how sleep can affect your health.  Define sleep hygiene, discuss sleep cycles and impact of sleep  habits. Review good sleep hygiene tips.    Initial Review & Psychosocial Screening:  Initial Psych Review & Screening - 10/24/20 1307       Initial Review   Current issues with None Identified      Family Dynamics   Good Support System? Yes   friends     Barriers   Psychosocial barriers to participate in program There are no identifiable barriers or psychosocial needs.;The patient should benefit from training in stress management and relaxation.      Screening Interventions   Interventions Encouraged to exercise;Provide feedback about the scores to participant;To provide support and resources with identified psychosocial needs    Expected Outcomes Short Term goal: Utilizing psychosocial counselor, staff and physician to assist with identification of specific Stressors or current issues interfering with healing process. Setting desired goal for each stressor or current issue identified.;Long Term Goal: Stressors or current issues are controlled or eliminated.;Short Term goal: Identification and review with participant of any Quality of Life or Depression concerns found by scoring the questionnaire.;Long Term goal: The participant improves quality of Life and PHQ9 Scores as seen by post scores and/or verbalization of changes             Quality of Life Scores:   Quality of Life - 11/03/20 1003       Quality of Life   Select Quality of Life      Quality of Life Scores   Health/Function Pre 26.4 %    Socioeconomic Pre 30 %    Psych/Spiritual Pre 30 %    Family Pre 30 %    GLOBAL Pre 28.41 %            Scores of 19 and below usually indicate a poorer quality of life in these areas.  A difference of  2-3 points is a clinically meaningful difference.  A difference of 2-3 points in the total score of the Quality of Life Index has been associated with significant improvement in overall quality of life, self-image, physical symptoms, and general health in studies assessing change  in quality of life.  PHQ-9: Recent Review Flowsheet Data     Depression screen [facility] 2/9 11/03/2020   Decreased Interest 0   Down, Depressed, Hopeless 0   PHQ - 2 Score 0   Altered sleeping 0   Tired, decreased energy 0   Change in appetite 0   Feeling bad or failure about yourself  0   Trouble concentrating 0   Moving slowly or fidgety/restless 0   Suicidal thoughts 0   PHQ-9 Score 0   Difficult doing work/chores Not difficult at all      Interpretation of Total Score  Total Score Depression Severity:  1-4 = Minimal depression, 5-9 = Mild depression, 10-14 = Moderate depression, 15-19 = Moderately severe depression, 20-27 = Severe depression   Psychosocial Evaluation  and Intervention:  Psychosocial Evaluation - 10/24/20 1319       Psychosocial Evaluation & Interventions   Interventions Encouraged to exercise with the program and follow exercise prescription    Comments Mr. Giuliani reports doing well post MI in May of this year. He is back to working outside which keeps him busy most days. He does not report any stress concerns. He has a lot of friends that make sure he is doing well, four that stop by daily to socialize. 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 He is proud of himself and is motivated to keep working to feel better. He wants to lose some weight and maintain his stamina and independence    Expected Outcomes Short: attend cardiac rehab for education and exercise. Long: develop and maintain positive self care habits.    Continue Psychosocial Services  Follow up required by staff             Psychosocial Re-Evaluation:   Psychosocial Discharge (Final Psychosocial Re-Evaluation):   Vocational Rehabilitation: Provide vocational rehab assistance to qualifying candidates.   Vocational Rehab Evaluation & Intervention:  Vocational Rehab - 10/24/20 1318       Initial  Vocational Rehab Evaluation & Intervention   Assessment shows need for Vocational Rehabilitation No             Education: Education Goals: Education classes will be provided on a variety of topics geared toward better understanding of heart health and risk factor modification. Participant will state understanding/return demonstration of topics presented as noted by education test scores.  Learning Barriers/Preferences:  Learning Barriers/Preferences - 10/24/20 1318       Learning Barriers/Preferences   Learning Barriers None    Learning Preferences None             General Cardiac Education Topics:  AED/CPR: - Group verbal and written instruction with the use of models to demonstrate the basic use of the AED with the basic ABC's of resuscitation.   Anatomy and Cardiac Procedures: - Group verbal and visual presentation and models provide information about basic cardiac anatomy and function. Reviews the testing methods done to diagnose heart disease and the outcomes of the test results. Describes the treatment choices: Medical Management, Angioplasty, or Coronary Bypass Surgery for treating various heart conditions including Myocardial Infarction, Angina, Valve Disease, and Cardiac Arrhythmias.  Written material given at graduation. Flowsheet Row Cardiac Rehab from 11/12/2020 in [facility] Cardiac and Pulmonary Rehab  Education need identified 11/03/20       Medication Safety: - Group verbal and visual instruction to review commonly prescribed medications for heart and lung disease. Reviews the medication, class of the drug, and side effects. Includes the steps to properly store meds and maintain the prescription regimen.  Written material given at graduation.   Intimacy: - Group verbal instruction through game format to discuss how heart and lung disease can affect sexual intimacy. Written material given at graduation.. Flowsheet Row Cardiac Rehab from 11/12/2020 in [facility]  Cardiac and Pulmonary Rehab  Date 11/12/20  Educator [facility]  Instruction Review Code 1- Verbalizes Understanding       Know Your Numbers and Heart Failure: - Group verbal and visual instruction to discuss disease risk factors for cardiac and pulmonary disease and treatment options.  Reviews associated critical values for Overweight/Obesity, Hypertension, Cholesterol, and Diabetes.  Discusses basics of heart  failure: signs/symptoms and treatments.  Introduces Heart Failure Zone chart for action plan for heart failure.  Written material given at graduation.   Infection Prevention: - Provides verbal and written material to individual with discussion of infection control including proper hand washing and proper equipment cleaning during exercise session. Flowsheet Row Cardiac Rehab from 11/12/2020 in [facility] Cardiac and Pulmonary Rehab  Date 11/03/20  Educator [facility]  Instruction Review Code 1- Verbalizes Understanding       Falls Prevention: - Provides verbal and written material to individual with discussion of falls prevention and safety. Flowsheet Row Cardiac Rehab from 11/12/2020 in [facility] Cardiac and Pulmonary Rehab  Date 11/03/20  Educator [facility]  Instruction Review Code 1- Verbalizes Understanding       Other: -Provides group and verbal instruction on various topics (see comments)   Knowledge Questionnaire Score:  Knowledge Questionnaire Score - 11/03/20 1004       Knowledge Questionnaire Score   Pre Score 22/26             Core Components/Risk Factors/Patient Goals at Admission:  Personal Goals and Risk Factors at Admission - 11/03/20 1004       Core Components/Risk Factors/Patient Goals on Admission    Weight Management Yes;Weight Loss;Obesity    Intervention Weight Management: Develop a combined nutrition and exercise program designed to reach desired caloric intake, while maintaining appropriate intake of nutrient and fiber, sodium and fats, and appropriate energy  expenditure required for the weight goal.;Weight Management: Provide education and appropriate resources to help participant work on and attain dietary goals.;Weight Management/Obesity: Establish reasonable short term and long term weight goals.;Obesity: Provide education and appropriate resources to help participant work on and attain dietary goals.    Admit Weight 216 lb 14.4 oz (98.4 kg)    Goal Weight: Short Term 210 lb (95.3 kg)    Goal Weight: Long Term 170 lb (77.1 kg)    Expected Outcomes Long Term: Adherence to nutrition and physical activity/exercise program aimed toward attainment of established weight goal;Short Term: Continue to assess and modify interventions until short term weight is achieved;Understanding recommendations for meals to include 15-35% energy as protein, 25-35% energy from fat, 35-60% energy from carbohydrates, less than 271m of dietary cholesterol, 20-35 gm of total fiber daily;Weight Loss: Understanding of general recommendations for a balanced deficit meal plan, which promotes 1-2 lb weight loss per week and includes a negative energy balance of [phone] kcal/d;Understanding of distribution of calorie intake throughout the day with the consumption of 4-5 meals/snacks    Diabetes Yes    Intervention Provide education about signs/symptoms and action to take for hypo/hyperglycemia.;Provide education about proper nutrition, including hydration, and aerobic/resistive exercise prescription along with prescribed medications to achieve blood glucose in normal ranges: Fasting glucose 65-99 mg/dL    Expected Outcomes Short Term: Participant verbalizes understanding of the signs/symptoms and immediate care of hyper/hypoglycemia, proper foot care and importance of medication, aerobic/resistive exercise and nutrition plan for blood glucose control.;Long Term: Attainment of HbA1C < 7%.    Heart Failure Yes    Intervention Provide a combined exercise and nutrition program that is  supplemented with education, support and counseling about heart failure. Directed toward relieving symptoms such as shortness of breath, decreased exercise tolerance, and extremity edema.    Expected Outcomes Improve functional capacity of life;Short term: Attendance in program 2-3 days a week with increased exercise capacity. Reported lower sodium intake. Reported increased fruit and vegetable intake. Reports medication compliance.;Short term: Daily weights obtained and reported  for increase. Utilizing diuretic protocols set by physician.;Long term: Adoption of self-care skills and reduction of barriers for early signs and symptoms recognition and intervention leading to self-care maintenance.    Hypertension Yes    Intervention Provide education on lifestyle modifcations including regular physical activity/exercise, weight management, moderate sodium restriction and increased consumption of fresh fruit, vegetables, and low fat dairy, alcohol moderation, and smoking cessation.;Monitor prescription use compliance.    Expected Outcomes Short Term: Continued assessment and intervention until BP is < 140/7m HG in hypertensive participants. < 130/822mHG in hypertensive participants with diabetes, heart failure or chronic kidney disease.;Long Term: Maintenance of blood pressure at goal levels.    Lipids Yes    Intervention Provide education and support for participant on nutrition & aerobic/resistive exercise along with prescribed medications to achieve LDL <706mHDL >79m51m  Expected Outcomes Short Term: Participant states understanding of desired cholesterol values and is compliant with medications prescribed. Participant is following exercise prescription and nutrition guidelines.;Long Term: Cholesterol controlled with medications as prescribed, with individualized exercise RX and with personalized nutrition plan. Value goals: LDL < 70mg74mL > 40 mg.             Education:Diabetes - Individual  verbal and written instruction to review signs/symptoms of diabetes, desired ranges of glucose level fasting, after meals and with exercise. Acknowledge that pre and post exercise glucose checks will be done for 3 sessions at entry of program. [place] [facility]  Date 10/24/20  Educator [facility] Code 1- Verbalizes Understanding       Core Components/Risk Factors/Patient Goals Review:    Core Components/Risk Factors/Patient Goals at Discharge (Final Review):    ITP Comments:  ITP Comments     Row Name 10/24/20 1322 11/03/20 0948 11/05/20 0924 11/10/20 0930 11/19/20 0641   ITP Comments Initial telephone orientation completed. Diagnosis can be found in [facility]. EP orientation scheduled for Monday 10/17 at 8am. Completed 6MWT and gym orientation. Initial ITP created and sent for review to Dr. Mark Emily Filbertical Director. First full day of exercise!  Patient was oriented to gym and equipment including functions, settings, policies, and procedures.  Patient's individual exercise prescription and treatment plan were reviewed.  All starting workloads were established based on the results of the 6 minute walk test done at initial orientation visit.  The plan for exercise progression was also introduced and progression will be customized based on patient's performance and goals. Completed initial RD consultation 30 Day review completed. Medical Director ITP review done, changes made as directed, and signed approval by Medical Director.            Comments:  30 Day review completed. Medical Director ITP review done, changes made as directed, and signed approval by Medical Director.

## 2020-11-19 NOTE — Progress Notes (Signed)
Daily Session Note  Patient Details  Name: Joshua Ferrell. MRN: [id number] Date of Birth: 02/06/1945 Referring Provider:   Flowsheet Row Cardiac Rehab from 11/03/2020 in [facility] Cardiac and Pulmonary Rehab  Referring Provider Isaias Cowman MD       Encounter Date: 11/19/2020  Check In:  Session Check In - 11/19/20 5945       Check-In   Supervising physician immediately available to respond to emergencies See telemetry face sheet for immediately available ER MD    Location [facility]-Cardiac & Pulmonary Rehab    Staff Present Birdie Sons, MPA, Elveria Rising, BA, ACSM CEP, Exercise Physiologist;Joseph Tessie Fass, [place]    Virtual Visit No    Medication changes reported     No    Fall or balance concerns reported    No    Tobacco Cessation No Change    Warm-up and Cool-down Performed on first and last piece of equipment    Resistance Training Performed Yes    VAD Patient? No    PAD/SET Patient? No      Pain Assessment   Currently in Pain? No/denies                Social History   Tobacco Use  Smoking Status Former   Packs/day: 0.00   Years: 0.00   Pack years: 0.00   Types: Cigarettes  Smokeless Tobacco Former   Types: Chew   Quit date: 1990    Goals Met:  Independence with exercise equipment Exercise tolerated well No report of concerns or symptoms today Strength training completed today  Goals Unmet:  Not Applicable  Comments: Pt able to follow exercise prescription today without complaint.  Will continue to monitor for progression.    Dr. Emily Filbert is Medical Director for [place].  Dr. Ottie Glazier is Medical Director for [facility] Pulmonary Rehabilitation.

## 2020-11-21 ENCOUNTER — Other Ambulatory Visit: Payer: Self-pay

## 2020-11-21 ENCOUNTER — Encounter: Payer: PPO | Admitting: *Deleted

## 2020-11-21 DIAGNOSIS — I213 ST elevation (STEMI) myocardial infarction of unspecified site: Secondary | ICD-10-CM

## 2020-11-21 DIAGNOSIS — Z955 Presence of coronary angioplasty implant and graft: Secondary | ICD-10-CM

## 2020-11-21 NOTE — Progress Notes (Signed)
Daily Session Note  Patient Details  Name: Joshua Ferrell. MRN: [id number] Date of Birth: 04/15/1945 Referring Provider:   Flowsheet Row Cardiac Rehab from 11/03/2020 in [facility] Cardiac and Pulmonary Rehab  Referring Provider Isaias Cowman MD       Encounter Date: 11/21/2020  Check In:  Session Check In - 11/21/20 1005       Check-In   Supervising physician immediately available to respond to emergencies See telemetry face sheet for immediately available ER MD    Location [facility]-Cardiac & Pulmonary Rehab    Staff Present Heath Lark, RN, BSN, CCRP;Jessica [place], MA, RCEP, CCRP, CCET;Joseph [place], [place]    Virtual Visit No    Medication changes reported     No    Fall or balance concerns reported    No    Warm-up and Cool-down Performed on first and last piece of equipment    Resistance Training Performed Yes    VAD Patient? No    PAD/SET Patient? No      Pain Assessment   Currently in Pain? No/denies                Social History   Tobacco Use  Smoking Status Former   Packs/day: 0.00   Years: 0.00   Pack years: 0.00   Types: Cigarettes  Smokeless Tobacco Former   Types: Chew   Quit date: 1990    Goals Met:  Independence with exercise equipment Exercise tolerated well No report of concerns or symptoms today  Goals Unmet:  Not Applicable  Comments: Pt able to follow exercise prescription today without complaint.  Will continue to monitor for progression.    Dr. Emily Filbert is Medical Director for [place].  Dr. Ottie Glazier is Medical Director for [facility] Pulmonary Rehabilitation.

## 2020-11-24 ENCOUNTER — Encounter: Payer: PPO | Admitting: *Deleted

## 2020-11-24 ENCOUNTER — Other Ambulatory Visit: Payer: Self-pay

## 2020-11-24 DIAGNOSIS — Z955 Presence of coronary angioplasty implant and graft: Secondary | ICD-10-CM

## 2020-11-24 DIAGNOSIS — I213 ST elevation (STEMI) myocardial infarction of unspecified site: Secondary | ICD-10-CM | POA: Diagnosis not present

## 2020-11-24 NOTE — Progress Notes (Signed)
Daily Session Note  Patient Details  Name: Coreyon Nicotra. MRN: [id number] Date of Birth: [birth year] Referring Provider:   Flowsheet Row Cardiac Rehab from 11/03/2020 in [facility] Cardiac and Pulmonary Rehab  Referring Provider Isaias Cowman MD       Encounter Date: 11/24/2020  Check In:  Session Check In - 11/24/20 0929       Check-In   Supervising physician immediately available to respond to emergencies See telemetry face sheet for immediately available ER MD    Location [facility]-Cardiac & Pulmonary Rehab    Staff Present Heath Lark, RN, BSN, Laveda Norman, BS, ACSM CEP, Exercise Physiologist;Joseph [place], [place]    Virtual Visit No    Medication changes reported     No    Fall or balance concerns reported    No    Warm-up and Cool-down Performed on first and last piece of equipment    Resistance Training Performed Yes    VAD Patient? No    PAD/SET Patient? No      Pain Assessment   Currently in Pain? No/denies                Social History   Tobacco Use  Smoking Status Former   Packs/day: 0.00   Years: 0.00   Pack years: 0.00   Types: Cigarettes  Smokeless Tobacco Former   Types: Chew   Quit date: 1990    Goals Met:  Independence with exercise equipment Exercise tolerated well No report of concerns or symptoms today  Goals Unmet:  Not Applicable  Comments: Pt able to follow exercise prescription today without complaint.  Will continue to monitor for progression.    Dr. Emily Filbert is Medical Director for [place].  Dr. Ottie Glazier is Medical Director for [facility] Pulmonary Rehabilitation.

## 2020-11-25 DIAGNOSIS — E119 Type 2 diabetes mellitus without complications: Secondary | ICD-10-CM | POA: Diagnosis not present

## 2020-11-26 ENCOUNTER — Other Ambulatory Visit: Payer: Self-pay

## 2020-11-26 DIAGNOSIS — I213 ST elevation (STEMI) myocardial infarction of unspecified site: Secondary | ICD-10-CM | POA: Diagnosis not present

## 2020-11-26 DIAGNOSIS — Z955 Presence of coronary angioplasty implant and graft: Secondary | ICD-10-CM

## 2020-11-26 NOTE — Progress Notes (Signed)
Daily Session Note  Patient Details  Name: Joshua Ferrell. MRN: [id number] Date of Birth: [birth year] Referring Provider:   Flowsheet Row Cardiac Rehab from 11/03/2020 in [facility] Cardiac and Pulmonary Rehab  Referring Provider Isaias Cowman MD       Encounter Date: 11/26/2020  Check In:  Session Check In - 11/26/20 0918       Check-In   Supervising physician immediately available to respond to emergencies See telemetry face sheet for immediately available ER MD    Location [facility]-Cardiac & Pulmonary Rehab    Staff Present Birdie Sons, MPA, RN;Joseph [place], [place], MA, RCEP, CCRP, CCET    Virtual Visit No    Medication changes reported     No    Fall or balance concerns reported    No    Tobacco Cessation No Change    Warm-up and Cool-down Performed on first and last piece of equipment    Resistance Training Performed Yes    VAD Patient? No    PAD/SET Patient? No      Pain Assessment   Currently in Pain? No/denies                Social History   Tobacco Use  Smoking Status Former   Packs/day: 0.00   Years: 0.00   Pack years: 0.00   Types: Cigarettes  Smokeless Tobacco Former   Types: Chew   Quit date: 1990    Goals Met:  Independence with exercise equipment Exercise tolerated well No report of concerns or symptoms today Strength training completed today  Goals Unmet:  Not Applicable  Comments: Pt able to follow exercise prescription today without complaint.  Will continue to monitor for progression.    Dr. Emily Filbert is Medical Director for [place].  Dr. Ottie Glazier is Medical Director for [facility] Pulmonary Rehabilitation.

## 2020-11-28 ENCOUNTER — Encounter: Payer: PPO | Admitting: *Deleted

## 2020-11-28 ENCOUNTER — Other Ambulatory Visit: Payer: Self-pay

## 2020-11-28 DIAGNOSIS — I213 ST elevation (STEMI) myocardial infarction of unspecified site: Secondary | ICD-10-CM

## 2020-11-28 DIAGNOSIS — Z955 Presence of coronary angioplasty implant and graft: Secondary | ICD-10-CM

## 2020-11-28 NOTE — Progress Notes (Signed)
Daily Session Note  Patient Details  Name: Naif Alabi. MRN: [id number] Date of Birth: [birth year] Referring Provider:   Flowsheet Row Cardiac Rehab from 11/03/2020 in [facility] Cardiac and Pulmonary Rehab  Referring Provider Isaias Cowman MD       Encounter Date: 11/28/2020  Check In:  Session Check In - 11/28/20 0927       Check-In   Supervising physician immediately available to respond to emergencies See telemetry face sheet for immediately available ER MD    Location [facility]-Cardiac & Pulmonary Rehab    Staff Present Renita Papa, RN BSN;Joseph [place], RCP,RRT,BSRT;Laureen Trufant, [place], RRT, CPFT    Virtual Visit No    Medication changes reported     No    Fall or balance concerns reported    No    Warm-up and Cool-down Performed on first and last piece of equipment    Resistance Training Performed Yes    VAD Patient? No    PAD/SET Patient? No      Pain Assessment   Currently in Pain? No/denies                Social History   Tobacco Use  Smoking Status Former   Packs/day: 0.00   Years: 0.00   Pack years: 0.00   Types: Cigarettes  Smokeless Tobacco Former   Types: Chew   Quit date: 1990    Goals Met:  Independence with exercise equipment Exercise tolerated well No report of concerns or symptoms today Strength training completed today  Goals Unmet:  Not Applicable  Comments: Pt able to follow exercise prescription today without complaint.  Will continue to monitor for progression.    Dr. Emily Filbert is Medical Director for [place].  Dr. Ottie Glazier is Medical Director for [facility] Pulmonary Rehabilitation.

## 2020-12-01 ENCOUNTER — Encounter: Payer: PPO | Admitting: *Deleted

## 2020-12-01 ENCOUNTER — Other Ambulatory Visit: Payer: Self-pay

## 2020-12-01 DIAGNOSIS — I213 ST elevation (STEMI) myocardial infarction of unspecified site: Secondary | ICD-10-CM

## 2020-12-01 DIAGNOSIS — Z955 Presence of coronary angioplasty implant and graft: Secondary | ICD-10-CM

## 2020-12-01 NOTE — Progress Notes (Signed)
Daily Session Note  Patient Details  Name: Joshua Ferrell. MRN: [id number] Date of Birth: July 20, 1945 Referring Provider:   Flowsheet Row Cardiac Rehab from 11/03/2020 in [facility] Cardiac and Pulmonary Rehab  Referring Provider Isaias Cowman MD       Encounter Date: 12/01/2020  Check In:  Session Check In - 12/01/20 0925       Check-In   Supervising physician immediately available to respond to emergencies See telemetry face sheet for immediately available ER MD    Location [facility]-Cardiac & Pulmonary Rehab    Staff Present Heath Lark, RN, BSN, Laveda Norman, BS, ACSM CEP, Exercise Physiologist;Joseph [place], [place]    Virtual Visit No    Medication changes reported     No    Fall or balance concerns reported    No    Warm-up and Cool-down Performed on first and last piece of equipment    Resistance Training Performed Yes    VAD Patient? No    PAD/SET Patient? No      Pain Assessment   Currently in Pain? No/denies                Social History   Tobacco Use  Smoking Status Former   Packs/day: 0.00   Years: 0.00   Pack years: 0.00   Types: Cigarettes  Smokeless Tobacco Former   Types: Chew   Quit date: 1990    Goals Met:  Independence with exercise equipment Exercise tolerated well No report of concerns or symptoms today  Goals Unmet:  Not Applicable  Comments: Pt able to follow exercise prescription today without complaint.  Will continue to monitor for progression.    Dr. Emily Filbert is Medical Director for [place].  Dr. Ottie Glazier is Medical Director for [facility] Pulmonary Rehabilitation.

## 2020-12-03 ENCOUNTER — Other Ambulatory Visit: Payer: Self-pay

## 2020-12-03 DIAGNOSIS — I213 ST elevation (STEMI) myocardial infarction of unspecified site: Secondary | ICD-10-CM

## 2020-12-03 DIAGNOSIS — Z955 Presence of coronary angioplasty implant and graft: Secondary | ICD-10-CM

## 2020-12-03 NOTE — Progress Notes (Signed)
Daily Session Note  Patient Details  Name: Joshua Ferrell. MRN: [id number] Date of Birth: [birth year] Referring Provider:   Flowsheet Row Cardiac Rehab from 11/03/2020 in [facility] Cardiac and Pulmonary Rehab  Referring Provider Isaias Cowman MD       Encounter Date: 12/03/2020  Check In:  Session Check In - 12/03/20 0916       Check-In   Supervising physician immediately available to respond to emergencies See telemetry face sheet for immediately available ER MD    Location [facility]-Cardiac & Pulmonary Rehab    Staff Present Birdie Sons, MPA, RN;Joseph [place], RCP,RRT,BSRT;Amanda [place], BA, ACSM CEP, Exercise Physiologist    Virtual Visit No    Medication changes reported     No    Fall or balance concerns reported    No    Tobacco Cessation No Change    Warm-up and Cool-down Performed on first and last piece of equipment    Resistance Training Performed Yes    VAD Patient? No    PAD/SET Patient? No      Pain Assessment   Currently in Pain? No/denies                Social History   Tobacco Use  Smoking Status Former   Packs/day: 0.00   Years: 0.00   Pack years: 0.00   Types: Cigarettes  Smokeless Tobacco Former   Types: Chew   Quit date: 1990    Goals Met:  Independence with exercise equipment Exercise tolerated well No report of concerns or symptoms today Strength training completed today  Goals Unmet:  Not Applicable  Comments: Pt able to follow exercise prescription today without complaint.  Will continue to monitor for progression.    Dr. Emily Filbert is Medical Director for [place].  Dr. Ottie Glazier is Medical Director for [facility] Pulmonary Rehabilitation.

## 2020-12-05 ENCOUNTER — Encounter: Payer: PPO | Admitting: *Deleted

## 2020-12-05 ENCOUNTER — Other Ambulatory Visit: Payer: Self-pay

## 2020-12-05 DIAGNOSIS — I213 ST elevation (STEMI) myocardial infarction of unspecified site: Secondary | ICD-10-CM

## 2020-12-05 DIAGNOSIS — Z955 Presence of coronary angioplasty implant and graft: Secondary | ICD-10-CM

## 2020-12-05 NOTE — Progress Notes (Signed)
Daily Session Note  Patient Details  Name: Joshua Ferrell. MRN: [id number] Date of Birth: March 08, 1945 Referring Provider:   Flowsheet Row Cardiac Rehab from 11/03/2020 in [facility] Cardiac and Pulmonary Rehab  Referring Provider Isaias Cowman MD       Encounter Date: 12/05/2020  Check In:  Session Check In - 12/05/20 0921       Check-In   Supervising physician immediately available to respond to emergencies See telemetry face sheet for immediately available ER MD    Location [facility]-Cardiac & Pulmonary Rehab    Staff Present Renita Papa, RN BSN;Joseph [place], RCP,RRT,BSRT;Jessica [place], [place], RCEP, CCRP, CCET    Virtual Visit No    Medication changes reported     No    Fall or balance concerns reported    No    Warm-up and Cool-down Performed on first and last piece of equipment    Resistance Training Performed Yes    VAD Patient? No    PAD/SET Patient? No      Pain Assessment   Currently in Pain? No/denies                Social History   Tobacco Use  Smoking Status Former   Packs/day: 0.00   Years: 0.00   Pack years: 0.00   Types: Cigarettes  Smokeless Tobacco Former   Types: Chew   Quit date: 1990    Goals Met:  Independence with exercise equipment Exercise tolerated well No report of concerns or symptoms today Strength training completed today  Goals Unmet:  Not Applicable  Comments: Pt able to follow exercise prescription today without complaint.  Will continue to monitor for progression.    Dr. Emily Filbert is Medical Director for [place].  Dr. Ottie Glazier is Medical Director for [facility] Pulmonary Rehabilitation.

## 2020-12-08 ENCOUNTER — Encounter: Payer: PPO | Admitting: *Deleted

## 2020-12-08 ENCOUNTER — Other Ambulatory Visit: Payer: Self-pay

## 2020-12-08 DIAGNOSIS — I213 ST elevation (STEMI) myocardial infarction of unspecified site: Secondary | ICD-10-CM | POA: Diagnosis not present

## 2020-12-08 DIAGNOSIS — Z955 Presence of coronary angioplasty implant and graft: Secondary | ICD-10-CM

## 2020-12-08 NOTE — Progress Notes (Signed)
Daily Session Note  Patient Details  Name: Joshua Ferrell. MRN: [id number] Date of Birth: 1945-05-13 Referring Provider:   Flowsheet Row Cardiac Rehab from 11/03/2020 in [facility] Cardiac and Pulmonary Rehab  Referring Provider Isaias Cowman MD       Encounter Date: 12/08/2020  Check In:  Session Check In - 12/08/20 0934       Check-In   Supervising physician immediately available to respond to emergencies See telemetry face sheet for immediately available ER MD    Location [facility]-Cardiac & Pulmonary Rehab    Staff Present Heath Lark, RN, BSN, Laveda Norman, BS, ACSM CEP, Exercise Physiologist;Amanda Oletta Darter, [place], ACSM CEP, Exercise Physiologist    Virtual Visit No    Medication changes reported     No    Fall or balance concerns reported    No    Warm-up and Cool-down Performed on first and last piece of equipment    Resistance Training Performed Yes    VAD Patient? No    PAD/SET Patient? No      Pain Assessment   Currently in Pain? No/denies                Social History   Tobacco Use  Smoking Status Former   Packs/day: 0.00   Years: 0.00   Pack years: 0.00   Types: Cigarettes  Smokeless Tobacco Former   Types: Chew   Quit date: 1990    Goals Met:  Independence with exercise equipment Exercise tolerated well No report of concerns or symptoms today  Goals Unmet:  Not Applicable  Comments: Pt able to follow exercise prescription today without complaint.  Will continue to monitor for progression.    Dr. Emily Filbert is Medical Director for [place].  Dr. Ottie Glazier is Medical Director for [facility] Pulmonary Rehabilitation.

## 2020-12-10 ENCOUNTER — Other Ambulatory Visit: Payer: Self-pay

## 2020-12-10 DIAGNOSIS — I213 ST elevation (STEMI) myocardial infarction of unspecified site: Secondary | ICD-10-CM

## 2020-12-10 DIAGNOSIS — Z955 Presence of coronary angioplasty implant and graft: Secondary | ICD-10-CM

## 2020-12-10 NOTE — Progress Notes (Signed)
Daily Session Note  Patient Details  Name: Joshua Ferrell. MRN: [id number] Date of Birth: Jun 04, 1945 Referring Provider:   Flowsheet Row Cardiac Rehab from 11/03/2020 in [facility]  Referring Provider Isaias Cowman MD       Encounter Date: 12/10/2020  Check In:  Session Check In - 12/10/20 0918       Check-In   Supervising physician immediately available to respond to emergencies See telemetry face sheet for immediately available ER MD    Location [facility]-Cardiac & Pulmonary Rehab    Staff Present Birdie Sons, MPA, Elveria Rising, BA, ACSM CEP, Exercise Physiologist;Joseph Tessie Fass, [place]    Virtual Visit No    Medication changes reported     No    Fall or balance concerns reported    No    Tobacco Cessation No Change    Warm-up and Cool-down Performed on first and last piece of equipment    Resistance Training Performed Yes    VAD Patient? No    PAD/SET Patient? No      Pain Assessment   Currently in Pain? No/denies                Social History   Tobacco Use  Smoking Status Former   Packs/day: 0.00   Years: 0.00   Pack years: 0.00   Types: Cigarettes  Smokeless Tobacco Former   Types: Chew   Quit date: 1990    Goals Met:  Independence with exercise equipment Exercise tolerated well No report of concerns or symptoms today Strength training completed today  Goals Unmet:  Not Applicable  Comments: Pt able to follow exercise prescription today without complaint.  Will continue to monitor for progression.    Dr. Emily Filbert is Medical Director for [place].  Dr. Ottie Glazier is Medical Director for [facility] Pul[facility].

## 2020-12-17 ENCOUNTER — Encounter: Payer: Self-pay | Admitting: *Deleted

## 2020-12-17 ENCOUNTER — Other Ambulatory Visit: Payer: Self-pay

## 2020-12-17 DIAGNOSIS — I213 ST elevation (STEMI) myocardial infarction of unspecified site: Secondary | ICD-10-CM | POA: Diagnosis not present

## 2020-12-17 DIAGNOSIS — Z955 Presence of coronary angioplasty implant and graft: Secondary | ICD-10-CM

## 2020-12-17 NOTE — Progress Notes (Signed)
Daily Session Note  Patient Details  Name: Joshua Ferrell. MRN: [id number] Date of Birth: 1945/12/17 Referring Provider:   Flowsheet Row Cardiac Rehab from 11/03/2020 in [facility] Cardiac and Pulmonary Rehab  Referring Provider Isaias Cowman MD       Encounter Date: 12/17/2020  Check In:  Session Check In - 12/17/20 0914       Check-In   Supervising physician immediately available to respond to emergencies See telemetry face sheet for immediately available ER MD    Location [facility] Pul[facility] Re[facility]    Staff Present Birdie Sons, MPA, Elveria Rising, BA, ACSM CEP, Exercise Physiologist;Joseph Tessie Fass, [place]    Virtual Visit No    Medication changes reported     No    Fall or balance concerns reported    No    Tobacco Cessation No Change    Warm-up and Cool-down Performed on first and last piece of equipment    Resistance Training Performed Yes    VAD Patient? No    PAD/SET Patient? No      Pain Assessment   Currently in Pain? No/denies                Social History   Tobacco Use  Smoking Status Former   Packs/day: 0.00   Years: 0.00   Pack years: 0.00   Types: Cigarettes  Smokeless Tobacco Former   Types: Chew   Quit date: 1990    Goals Met:  Independence with exercise equipment Exercise tolerated well No report of concerns or symptoms today Strength training completed today  Goals Unmet:  Not Applicable  Comments: Pt able to follow exercise prescription today without complaint.  Will continue to monitor for progression.    Dr. Emily Filbert is Medical Director for [place].  Dr. Ottie Glazier is Medical Director for [facility] Pulmonary Rehabilitation.

## 2020-12-17 NOTE — Progress Notes (Signed)
Cardiac Individual Treatment Plan  Patient Details  Name: Joshua Ferrell. MRN: [id number] Date of Birth: [birth year] Referring Provider:   Flowsheet Row Cardiac Rehab from 11/03/2020 in [facility] Cardiac and Pulmonary Rehab  Referring Provider Isaias Cowman MD       Initial Encounter Date:  Flowsheet Row Cardiac Rehab from 11/03/2020 in [facility] Cardiac and Pulmonary Rehab  Date 11/03/20       Visit Diagnosis: ST elevation myocardial infarction (STEMI), unspecified artery [facility])  Status post coronary artery stent placement  Patient's Home Medications on Admission:  Current Outpatient Medications:    acetaminophen (TYLENOL) 650 MG CR tablet, Take by mouth. (Patient not taking: Reported on 10/24/2020), Disp: , Rfl:    amLODipine (NORVASC) 5 MG tablet, Take 5 mg by mouth daily., Disp: , Rfl:    aspirin EC 81 MG tablet, Take 81 mg by mouth daily., Disp: , Rfl:    clopidogrel (PLAVIX) 75 MG tablet, Take 1 tablet (75 mg total) by mouth daily with breakfast., Disp: 30 tablet, Rfl: 1   ezetimibe (ZETIA) 10 MG tablet, Take 1 tablet (10 mg total) by mouth daily., Disp: 30 tablet, Rfl: 1   glimepiride (AMARYL) 2 MG tablet, Take 2 mg by mouth daily with breakfast., Disp: , Rfl:    Investigational - Study Medication, Take 14 mg by mouth daily before breakfast. Study name: semaglutide 14 mg or placebo, Disp: , Rfl:    metFORMIN (GLUCOPHAGE) 500 MG tablet, Take 1,000 mg by mouth 2 (two) times daily with a meal., Disp: , Rfl:    metoprolol succinate (TOPROL-XL) 50 MG 24 hr tablet, Take 1 tablet (50 mg total) by mouth daily. Take with or immediately following a meal., Disp: 30 tablet, Rfl: 1   nitroGLYCERIN (NITROSTAT) 0.4 MG SL tablet, Place 0.4 mg under the tongue every 5 (five) minutes as needed for chest pain., Disp: , Rfl:    valsartan-hydrochlorothiazide (DIOVAN-HCT) 160-12.5 MG tablet, Take 1 tablet by mouth daily., Disp: , Rfl:   Past Medical History: Past Medical History:  Diagnosis  Date   Arthritis    Coronary artery disease    COVID-19 12/2018   Diabetes mellitus without complication (HCC)    Dyspnea    with exertion   GERD (gastroesophageal reflux disease)    minimal episodes   Headache(784.0)    Heart disease    History of blood transfusion 1990   internal injuries after a fall requiring blood   History of colonic polyps    Hyperglycemia    Hyperlipidemia    Hypertension    Myocardial infarction ([place]) 2003   PONV (postoperative nausea and vomiting)    one time only (surgery in 1990)    Tobacco Use: Social History   Tobacco Use  Smoking Status Former   Packs/day: 0.00   Years: 0.00   Pack years: 0.00   Types: Cigarettes  Smokeless Tobacco Former   Types: Chew   Quit date: 1990    Labs: 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   Exercise Prescription Goal: Initial exercise prescription builds to 30-45 minutes a day of aerobic activity, 2-3 days per week.  Home exercise guidelines will be  given to patient during program as part of exercise prescription that the participant will acknowledge.   Education: Aerobic Exercise: - Group verbal and visual presentation on the components of exercise prescription. Introduces F.I.T.T principle from ACSM for exercise prescriptions.  Reviews F.I.T.T. principles of aerobic exercise including progression. Written material given at graduation. Flowsheet Row Cardiac Rehab from 12/10/2020 in [facility] Cardiac and Pulmonary Rehab  Education need identified 11/03/20  Date 11/12/20  Educator [facility]  Instruction Review Code 1- Verbalizes Understanding       Education: Resistance Exercise: - Group verbal and visual  presentation on the components of exercise prescription. Introduces F.I.T.T principle from ACSM for exercise prescriptions  Reviews F.I.T.T. principles of resistance exercise including progression. Written material given at graduation.    Education: Exercise & Equipment Safety: - Individual verbal instruction and demonstration of equipment use and safety with use of the equipment. Flowsheet Row Cardiac Rehab from 12/10/2020 in [facility] Cardiac and Pulmonary Rehab  Date 11/03/20  Educator [facility]  Instruction Review Code 1- Verbalizes Understanding       Education: Exercise Physiology & General Exercise Guidelines: - Group verbal and written instruction with models to review the exercise physiology of the cardiovascular system and associated critical values. Provides general exercise guidelines with specific guidelines to those with heart or lung disease.    Education: Flexibility, Balance, Mind/Body Relaxation: - Group verbal and visual presentation with interactive activity on the components of exercise prescription. Introduces F.I.T.T principle from ACSM for exercise prescriptions. Reviews F.I.T.T. principles of flexibility and balance exercise training including progression. Also discusses the mind body connection.  Reviews various relaxation techniques to help reduce and manage stress (i.e. Deep breathing, progressive muscle relaxation, and visualization). Balance handout provided to take home. Written material given at graduation. Flowsheet Row Cardiac Rehab from 12/10/2020 in [facility] Cardiac and Pulmonary Rehab  Date 11/26/20  Educator [facility]  Instruction Review Code 1- Verbalizes Understanding       Activity Barriers & Risk Stratification:  Activity Barriers & Cardiac Risk Stratification - 10/24/20 1304       Activity Barriers & Cardiac Risk Stratification   Activity Barriers Arthritis;Joint Problems    Cardiac Risk Stratification High             6 Minute Walk:  6 Minute Walk      Row Name 11/03/20 0948         6 Minute Walk   Phase Initial     Distance 1220 feet     Walk Time 6 minutes     # of Rest Breaks 0     MPH 2.31     METS 2.24     RPE 11     VO2 Peak 7.85     Symptoms Yes (comment)     Comments chronic hip pain 5/10     Resting HR 62 bpm     Resting BP 126/72     Resting Oxygen Saturation  97 %     Exercise Oxygen Saturation  during 6 min walk 96 %     Max Ex. HR 97 bpm     Max Ex. BP 158/74     2 Minute Post BP 134/66              Oxygen Initial Assessment:   Oxygen Re-Evaluation:   Oxygen Discharge (Final Oxygen Re-Evaluation):   Initial Exercise Prescription:  Initial Exercise Prescription - 11/03/20 0900       Date of Initial Exercise RX and Referring Provider  Date 11/03/20    Referring Provider Paraschos, Alexander MD      Oxygen   Maintain Oxygen Saturation 88% or higher      Treadmill   MPH 2    Grade 0.5    Minutes 15    METs 2.67      Recumbant Bike   Level 2    RPM 50    Watts 10    Minutes 15    METs 2      REL-XR   Level 2    Speed 50    Minutes 15    METs 2      Prescription Details   Frequency (times per week) 3    Duration Progress to 30 minutes of continuous aerobic without signs/symptoms of physical distress      Intensity   THRR 40-80% of Max Heartrate 94-127    Ratings of Perceived Exertion 11-13    Perceived Dyspnea 0-4      Progression   Progression Continue to progress workloads to maintain intensity without signs/symptoms of physical distress.      Resistance Training   Training Prescription Yes    Weight 4 lb    Reps 10-15             Perform Capillary Blood Glucose checks as needed.  Exercise Prescription Changes:   Exercise Prescription Changes     Row Name 11/03/20 0900 11/17/20 1000 12/02/20 1400 12/16/20 0800       Response to Exercise   Blood Pressure (Admit) 126/72 122/64 132/70 110/62    Blood Pressure (Exercise) 158/74 142/74 150/92 --    Blood  Pressure (Exit) 134/66 120/64 126/58 110/60    Heart Rate (Admit) 62 bpm 70 bpm 65 bpm 64 bpm    Heart Rate (Exercise) 97 bpm 95 bpm 113 bpm 98 bpm    Heart Rate (Exit) 60 bpm 69 bpm 73 bpm 87 bpm    Oxygen Saturation (Admit) 97 % -- -- --    Oxygen Saturation (Exercise) 96 % -- -- --    Rating of Perceived Exertion (Exercise) _0 Symptoms chronic hip pain 5/10 none none none    Comments walk test results 2nd full week of exercise -- --    Duration -- Progress to 30 minutes of  aerobic without signs/symptoms of physical distress Progress to 30 minutes of  aerobic without signs/symptoms of physical distress Continue with 30 min of aerobic exercise without signs/symptoms of physical distress.    Intensity -- THRR unchanged THRR unchanged THRR unchanged      Progression   Progression -- Continue to progress workloads to maintain intensity without signs/symptoms of physical distress. Continue to progress workloads to maintain intensity without signs/symptoms of physical distress. Continue to progress workloads to maintain intensity without signs/symptoms of physical distress.    Average METs -- 3.11 3.35 3.36      Resistance Training   Training Prescription -- Yes Yes Yes    Weight -- 4 lb 5 lb 5 lb    Reps -- 10-15 10-15 10-15      Interval Training   Interval Training -- No No No      Treadmill   MPH -- 2.5 2.6 3    Grade -- 0.5 1 1.5    Minutes -- _1 METs -- 3.09 3.35 3.92      Recumbant Bike   Level -- 2 -- 3    Watts -- -- --  25    Minutes -- 15 -- 15    METs -- 2.31 -- 2.92      REL-XR   Level -- _0 Minutes -- _1 METs -- 3.2 -- --             Exercise Comments:   Exercise Comments     Row Name 11/05/20 0925           Exercise Comments First full day of exercise!  Patient was oriented to gym and equipment including functions, settings, policies, and procedures.  Patient's individual exercise prescription and treatment plan  were reviewed.  All starting workloads were established based on the results of the 6 minute walk test done at initial orientation visit.  The plan for exercise progression was also introduced and progression will be customized based on patient's performance and goals.                Exercise Goals and Review:   Exercise Goals     Row Name 11/03/20 1002             Exercise Goals   Increase Physical Activity Yes       Intervention Provide advice, education, support and counseling about physical activity/exercise needs.;Develop an individualized exercise prescription for aerobic and resistive training based on initial evaluation findings, risk stratification, comorbidities and participant's personal goals.       Expected Outcomes Short Term: Attend rehab on a regular basis to increase amount of physical activity.;Long Term: Add in home exercise to make exercise part of routine and to increase amount of physical activity.;Long Term: Exercising regularly at least 3-5 days a week.       Increase Strength and Stamina Yes       Intervention Provide advice, education, support and counseling about physical activity/exercise needs.;Develop an individualized exercise prescription for aerobic and resistive training based on initial evaluation findings, risk stratification, comorbidities and participant's personal goals.       Expected Outcomes Short Term: Increase workloads from initial exercise prescription for resistance, speed, and METs.;Short Term: Perform resistance training exercises routinely during rehab and add in resistance training at home;Long Term: Improve cardiorespiratory fitness, muscular endurance and strength as measured by increased METs and functional capacity (6MWT)       Able to understand and use rate of perceived exertion (RPE) scale Yes       Intervention Provide education and explanation on how to use RPE scale       Expected Outcomes Short Term: Able to use RPE daily in  rehab to express subjective intensity level;Long Term:  Able to use RPE to guide intensity level when exercising independently       Able to understand and use Dyspnea scale Yes       Intervention Provide education and explanation on how to use Dyspnea scale       Expected Outcomes Short Term: Able to use Dyspnea scale daily in rehab to express subjective sense of shortness of breath during exertion;Long Term: Able to use Dyspnea scale to guide intensity level when exercising independently       Knowledge and understanding of Target Heart Rate Range (THRR) Yes       Intervention Provide education and explanation of THRR including how the numbers were predicted and where they are located for reference       Expected Outcomes Short Term: Able to state/look up THRR;Short Term: Able to use daily  as guideline for intensity in rehab;Long Term: Able to use THRR to govern intensity when exercising independently       Able to check pulse independently Yes       Intervention Provide education and demonstration on how to check pulse in carotid and radial arteries.;Review the importance of being able to check your own pulse for safety during independent exercise       Expected Outcomes Short Term: Able to explain why pulse checking is important during independent exercise;Long Term: Able to check pulse independently and accurately       Understanding of Exercise Prescription Yes       Intervention Provide education, explanation, and written materials on patient's individual exercise prescription       Expected Outcomes Long Term: Able to explain home exercise prescription to exercise independently;Short Term: Able to explain program exercise prescription                Exercise Goals Re-Evaluation :  Exercise Goals Re-Evaluation     Row Name 11/05/20 0925 11/17/20 1017 12/02/20 1441 12/16/20 0834       Exercise Goal Re-Evaluation   Exercise Goals Review Increase Physical Activity;Understanding of  Exercise Prescription;Knowledge and understanding of Target Heart Rate Range (THRR);Able to understand and use rate of perceived exertion (RPE) scale;Increase Strength and Stamina;Able to understand and use Dyspnea scale;Able to check pulse independently Increase Physical Activity;Increase Strength and Stamina Increase Physical Activity;Increase Strength and Stamina Increase Physical Activity;Increase Strength and Stamina;Understanding of Exercise Prescription    Comments Reviewed RPE and dyspnea scales, THR and program prescription with pt today.  Pt voiced understanding and was given a copy of goals to take home. Elison is doing well for the first couple of weeks that he has been here. He is tolerating his exercise prescription well and is hitting his [place] so far. Will continue to monitor as he progresses through the program. [place] continues to do well and has increased TM to 2.6 mph and 1% incline.  He uses 5 lb for strength training. We will monitor progress. [place] is doing well in rehab.  He is up to 3 mph on the treadmill.  He has not been turning in his METs from the XR and we will work with him on this.  We will continue to monitor his progress.    Expected Outcomes Short: Use RPE daily to regulate intensity. Long: Follow program prescription in THR. Short: Continue current exercise prescription and start to increase loads as tolerated Long: Increase overall MET level Short:  attend consistently and reach THR range Long:  build overall stamina Short: Turn in METs from XR Long: Continue to improve stamina.             Discharge Exercise Prescription (Final Exercise Prescription Changes):  Exercise Prescription Changes - 12/16/20 0800       Response to Exercise   Blood Pressure (Admit) 110/62    Blood Pressure (Exit) 110/60    Heart Rate (Admit) 64 bpm    Heart Rate (Exercise) 98 bpm    Heart Rate (Exit) 87 bpm    Rating of Perceived Exertion (Exercise) 12    Symptoms none    Duration  Continue with 30 min of aerobic exercise without signs/symptoms of physical distress.    Intensity THRR unchanged      Progression   Progression Continue to progress workloads to maintain intensity without signs/symptoms of physical distress.    Average METs 3.36      Resistance  Training   Training Prescription Yes    Weight 5 lb    Reps 10-15      Interval Training   Interval Training No      Treadmill   MPH 3    Grade 1.5    Minutes 15    METs 3.92      Recumbant Bike   Level 3    Watts 25    Minutes 15    METs 2.92      REL-XR   Level 4    Minutes 15             Nutrition:  Target Goals: Understanding of nutrition guidelines, daily intake of sodium <1569m, cholesterol <2059m calories 30% from fat and 7% or less from saturated fats, daily to have 5 or more servings of fruits and vegetables.  Education: All About Nutrition: -Group instruction provided by verbal, written material, interactive activities, discussions, models, and posters to present general guidelines for heart healthy nutrition including fat, fiber, MyPlate, the role of sodium in heart healthy nutrition, utilization of the nutrition label, and utilization of this knowledge for meal planning. Follow up email sent as well. Written material given at graduation. Flow[facility]  Education need identified 11/03/20  Date 12/03/20  Educator MCButlerInstruction Review Code 1- Verbalizes Understanding       Biometrics:  Pre Biometrics - 11/03/20 1002       Pre Biometrics   Height 5' 8.2" (1.732 m)    Weight 216 lb 14.4 oz (98.4 kg)    BMI (Calculated) 32.8    Single Leg Stand 2.5 seconds              Nutrition Therapy Plan and Nutrition Goals:  Nutrition Therapy & Goals - 11/10/20 0845       Nutrition Therapy   Drug/Food Interactions Food/Disease    Protein (specify units) 75g    Fiber 30 grams    Whole Grain Foods 3  servings    Saturated Fats 12 max. grams    Fruits and Vegetables 8 servings/day    Sodium 1.5 grams      Personal Nutrition Goals   Nutrition Goal ST: 2-4 CHO servings at meals (make sure to have at least 2 CHO servings for breakfast to avoid BG lows during exercise) LT: Include at least 5 fruit/vegetable servings per day, limit processed meat to <3x/week    Comments On investigational study medication: semaglutide 14 mg or placebo, Historical Med. Also on Metformin. A1C 5.8 per patient last month. B: may or may not cook - bacon or sausage with eggs and sometimes toast (whole wheat bread). Ham and egg on bun 1x/week. Cereal once in a while. Always coffee (black) S: cookie with maybe a diet coke L: Ham or tu[place]wich. Cooks chicken wings or leftovers. D: grills chicken, hamburger eveyr once in a while, fish. Green beans, sugar peas, corn, cauliflower, cabbage, collards, "whatever strikes me". Drinks: 1-3 beers before dinner and water. Discussed heart healthy eating and focused on diabetes friendly eating due to time constraints. Patient will need review/follow up education on general heart healhty eating and meal planning.      Intervention Plan   Intervention Prescribe, educate and counsel regarding individualized specific dietary modifications aiming towards targeted core components such as weight, hypertension, lipid management, diabetes, heart failure and other comorbidities.    Expected Outcomes Short Term Goal: Understand basic principles of dietary content,  such as calories, fat, sodium, cholesterol and nutrients.;Short Term Goal: A plan has been developed with personal nutrition goals set during dietitian appointment.;Long Term Goal: Adherence to prescribed nutrition plan.             Nutrition Assessments:  MEDIFICTS Score Key: ?70 Need to make dietary changes  40-70 Heart Healthy Diet ? 40 Therapeutic Level Cholesterol Diet  Flowsheet Row Cardiac Rehab from 11/03/2020 in [facility]  Cardiac and Pulmonary Rehab  Picture Your Plate Total Score on Admission 72      Picture Your Plate Scores: <92 Unhealthy dietary pattern with much room for improvement. 41-50 Dietary pattern unlikely to meet recommendations for good health and room for improvement. 51-60 More healthful dietary pattern, with some room for improvement.  >60 Healthy dietary pattern, although there may be some specific behaviors that could be improved.    Nutrition Goals Re-Evaluation:   Nutrition Goals Discharge (Final Nutrition Goals Re-Evaluation):   Psychosocial: Target Goals: Acknowledge presence or absence of significant depression and/or stress, maximize coping skills, provide positive support system. Participant is able to verbalize types and ability to use techniques and skills needed for reducing stress and depression.   Education: Stress, Anxiety, and Depression - Group verbal and visual presentation to define topics covered.  Reviews how body is impacted by stress, anxiety, and depression.  Also discusses healthy ways to reduce stress and to treat/manage anxiety and depression.  Written material given at graduation. Flowsheet Row Cardiac Rehab from 12/10/2020 in [facility] Cardiac and Pulmonary Rehab  Education need identified 11/03/20       Education: Sleep Hygiene -Provides group verbal and written instruction about how sleep can affect your health.  Define sleep hygiene, discuss sleep cycles and impact of sleep habits. Review good sleep hygiene tips.    Initial Review & Psychosocial Screening:  Initial Psych Review & Screening - 10/24/20 1307       Initial Review   Current issues with None Identified      Family Dynamics   Good Support System? Yes   friends     Barriers   Psychosocial barriers to participate in program There are no identifiable barriers or psychosocial needs.;The patient should benefit from training in stress management and relaxation.      Screening Interventions    Interventions Encouraged to exercise;Provide feedback about the scores to participant;To provide support and resources with identified psychosocial needs    Expected Outcomes Short Term goal: Utilizing psychosocial counselor, staff and physician to assist with identification of specific Stressors or current issues interfering with healing process. Setting desired goal for each stressor or current issue identified.;Long Term Goal: Stressors or current issues are controlled or eliminated.;Short Term goal: Identification and review with participant of any Quality of Life or Depression concerns found by scoring the questionnaire.;Long Term goal: The participant improves quality of Life and PHQ9 Scores as seen by post scores and/or verbalization of changes             Quality of Life Scores:   Quality of Life - 11/03/20 1003       Quality of Life   Select Quality of Life      Quality of Life Scores   Health/Function Pre 26.4 %    Socioeconomic Pre 30 %    Psych/Spiritual Pre 30 %    Family Pre 30 %    GLOBAL Pre 28.41 %            Scores of 19 and below usually indicate  a poorer quality of life in these areas.  A difference of  2-3 points is a clinically meaningful difference.  A difference of 2-3 points in the total score of the Quality of Life Index has been associated with significant improvement in overall quality of life, self-image, physical symptoms, and general health in studies assessing change in quality of life.  PHQ-9: Recent Review Flowsheet Data     Depression screen [facility] 2/9 11/03/2020   Decreased Interest 0   Down, Depressed, Hopeless 0   PHQ - 2 Score 0   Altered sleeping 0   Tired, decreased energy 0   Change in appetite 0   Feeling bad or failure about yourself  0   Trouble concentrating 0   Moving slowly or fidgety/restless 0   Suicidal thoughts 0   PHQ-9 Score 0   Difficult doing work/chores Not difficult at all      Interpretation of Total Score   Total Score Depression Severity:  1-4 = Minimal depression, 5-9 = Mild depression, 10-14 = Moderate depression, 15-19 = Moderately severe depression, 20-27 = Severe depression   Psychosocial Evaluation and Intervention:  Psychosocial Evaluation - 10/24/20 1319       Psychosocial Evaluation & Interventions   Interventions Encouraged to exercise with the program and follow exercise prescription    Comments Mr. Mort reports doing well post MI in May of this year. He is back to working outside which keeps him busy most days. He does not report any stress concerns. He has a lot of friends that make sure he is doing well, four that stop by daily to socialize. 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 He is proud of himself and is motivated to keep working to feel better. He wants to lose some weight and maintain his stamina and independence    Expected Outcomes Short: attend cardiac rehab for education and exercise. Long: develop and maintain positive self care habits.    Continue Psychosocial Services  Follow up required by staff             Psychosocial Re-Evaluation:   Psychosocial Discharge (Final Psychosocial Re-Evaluation):   Vocational Rehabilitation: Provide vocational rehab assistance to qualifying candidates.   Vocational Rehab Evaluation & Intervention:  Vocational Rehab - 10/24/20 1318       Initial Vocational Rehab Evaluation & Intervention   Assessment shows need for Vocational Rehabilitation No             Education: Education Goals: Education classes will be provided on a variety of topics geared toward better understanding of heart health and risk factor modification. Participant will state understanding/return demonstration of topics presented as noted by education test scores.  Learning Barriers/Preferences:  Learning Barriers/Preferences - 10/24/20 1318        Learning Barriers/Preferences   Learning Barriers None    Learning Preferences None             General Cardiac Education Topics:  AED/CPR: - Group verbal and written instruction with the use of models to demonstrate the basic use of the AED with the basic ABC's of resuscitation.   Anatomy and Cardiac Procedures: - Group verbal and visual presentation and models provide information about basic cardiac anatomy and function. Reviews the testing methods done to diagnose heart disease and the outcomes of the test results. Describes the treatment choices: Medical Management,  Angioplasty, or Coronary Bypass Surgery for treating various heart conditions including Myocardial Infarction, Angina, Valve Disease, and Cardiac Arrhythmias.  Written material given at graduation. Flowsheet Row Cardiac Rehab from 12/10/2020 in [facility] Cardiac and Pulmonary Rehab  Education need identified 11/03/20       Medication Safety: - Group verbal and visual instruction to review commonly prescribed medications for heart and lung disease. Reviews the medication, class of the drug, and side effects. Includes the steps to properly store meds and maintain the prescription regimen.  Written material given at graduation.   Intimacy: - Group verbal instruction through game format to discuss how heart and lung disease can affect sexual intimacy. Written material given at graduation.. Flowsheet Row Cardiac Rehab from 12/10/2020 in [facility] Cardiac and Pulmonary Rehab  Date 11/12/20  Educator [facility]  Instruction Review Code 1- Verbalizes Understanding       Know Your Numbers and Heart Failure: - Group verbal and visual instruction to discuss disease risk factors for cardiac and pulmonary disease and treatment options.  Reviews associated critical values for Overweight/Obesity, Hypertension, Cholesterol, and Diabetes.  Discusses basics of heart failure: signs/symptoms and treatments.  Introduces Heart Failure Zone chart for  action plan for heart failure.  Written material given at graduation.   Infection Prevention: - Provides verbal and written material to individual with discussion of infection control including proper hand washing and proper equipment cleaning during exercise session. Flowsheet Row Cardiac Rehab from 12/10/2020 in [facility] Cardiac and Pulmonary Rehab  Date 11/03/20  Educator [facility]  Instruction Review Code 1- Verbalizes Understanding       Falls Prevention: - Provides verbal and written material to individual with discussion of falls prevention and safety. Flowsheet Row Cardiac Rehab from 12/10/2020 in [facility] Cardiac and Pulmonary Rehab  Date 11/03/20  Educator [facility]  Instruction Review Code 1- Verbalizes Understanding       Other: -Provides group and verbal instruction on various topics (see comments)   Knowledge Questionnaire Score:  Knowledge Questionnaire Score - 11/03/20 1004       Knowledge Questionnaire Score   Pre Score 22/26             Core Components/Risk Factors/Patient Goals at Admission:  Personal Goals and Risk Factors at Admission - 11/03/20 1004       Core Components/Risk Factors/Patient Goals on Admission    Weight Management Yes;Weight Loss;Obesity    Intervention Weight Management: Develop a combined nutrition and exercise program designed to reach desired caloric intake, while maintaining appropriate intake of nutrient and fiber, sodium and fats, and appropriate energy expenditure required for the weight goal.;Weight Management: Provide education and appropriate resources to help participant work on and attain dietary goals.;Weight Management/Obesity: Establish reasonable short term and long term weight goals.;Obesity: Provide education and appropriate resources to help participant work on and attain dietary goals.    Admit Weight 216 lb 14.4 oz (98.4 kg)    Goal Weight: Short Term 210 lb (95.3 kg)    Goal Weight: Long Term 170 lb (77.1 kg)    Expected  Outcomes Long Term: Adherence to nutrition and physical activity/exercise program aimed toward attainment of established weight goal;Short Term: Continue to assess and modify interventions until short term weight is achieved;Understanding recommendations for meals to include 15-35% energy as protein, 25-35% energy from fat, 35-60% energy from carbohydrates, less than 232m of dietary cholesterol, 20-35 gm of total fiber daily;Weight Loss: Understanding of general recommendations for a balanced deficit meal plan, which promotes 1-2 lb weight loss per  week and includes a negative energy balance of [phone] kcal/d;Understanding of distribution of calorie intake throughout the day with the consumption of 4-5 meals/snacks    Diabetes Yes    Intervention Provide education about signs/symptoms and action to take for hypo/hyperglycemia.;Provide education about proper nutrition, including hydration, and aerobic/resistive exercise prescription along with prescribed medications to achieve blood glucose in normal ranges: Fasting glucose 65-99 mg/dL    Expected Outcomes Short Term: Participant verbalizes understanding of the signs/symptoms and immediate care of hyper/hypoglycemia, proper foot care and importance of medication, aerobic/resistive exercise and nutrition plan for blood glucose control.;Long Term: Attainment of HbA1C < 7%.    Heart Failure Yes    Intervention Provide a combined exercise and nutrition program that is supplemented with education, support and counseling about heart failure. Directed toward relieving symptoms such as shortness of breath, decreased exercise tolerance, and extremity edema.    Expected Outcomes Improve functional capacity of life;Short term: Attendance in program 2-3 days a week with increased exercise capacity. Reported lower sodium intake. Reported increased fruit and vegetable intake. Reports medication compliance.;Short term: Daily weights obtained and reported for increase.  Utilizing diuretic protocols set by physician.;Long term: Adoption of self-care skills and reduction of barriers for early signs and symptoms recognition and intervention leading to self-care maintenance.    Hypertension Yes    Intervention Provide education on lifestyle modifcations including regular physical activity/exercise, weight management, moderate sodium restriction and increased consumption of fresh fruit, vegetables, and low fat dairy, alcohol moderation, and smoking cessation.;Monitor prescription use compliance.    Expected Outcomes Short Term: Continued assessment and intervention until BP is < 140/62m HG in hypertensive participants. < 130/891mHG in hypertensive participants with diabetes, heart failure or chronic kidney disease.;Long Term: Maintenance of blood pressure at goal levels.    Lipids Yes    Intervention Provide education and support for participant on nutrition & aerobic/resistive exercise along with prescribed medications to achieve LDL <7063mHDL >33m48m  Expected Outcomes Short Term: Participant states understanding of desired cholesterol values and is compliant with medications prescribed. Participant is following exercise prescription and nutrition guidelines.;Long Term: Cholesterol controlled with medications as prescribed, with individualized exercise RX and with personalized nutrition plan. Value goals: LDL < 70mg56mL > 40 mg.             Education:Diabetes - Individual verbal and written instruction to review signs/symptoms of diabetes, desired ranges of glucose level fasting, after meals and with exercise. Acknowledge that pre and post exercise glucose checks will be done for 3 sessions at entry of program. [facility]  Date 10/24/20  Educator [facility] Code 1- Verbalizes Understanding       Core Components/Risk Factors/Patient Goals Review:    Core Components/Risk  Factors/Patient Goals at Discharge (Final Review):    ITP Comments:  ITP Comments     Row Name 10/24/20 1322 11/03/20 0948 11/05/20 0924 11/10/20 0930 11/19/20 0641   ITP Comments Initial telephone orientation completed. Diagnosis can be found in [facility]. EP orientation scheduled for Monday 10/17 at 8am. Completed 6MWT and gym orientation. Initial ITP created and sent for review to Dr. Mark Emily Filbertical Director. First full day of exercise!  Patient was oriented to gym and equipment including functions, settings, policies, and procedures.  Patient's individual exercise prescription and treatment plan were reviewed.  All starting workloads were established based on the results of the 6 minute walk test  done at initial orientation visit.  The plan for exercise progression was also introduced and progression will be customized based on patient's performance and goals. Completed initial RD consultation 30 Day review completed. Medical Director ITP review done, changes made as directed, and signed approval by Medical Director.    Wall Name 12/17/20 0637           ITP Comments 30 Day review completed. Medical Director ITP review done, changes made as directed, and signed approval by Medical Director.                Comments:

## 2020-12-18 DIAGNOSIS — M25511 Pain in right shoulder: Secondary | ICD-10-CM | POA: Diagnosis not present

## 2020-12-18 DIAGNOSIS — M159 Polyosteoarthritis, unspecified: Secondary | ICD-10-CM | POA: Diagnosis not present

## 2020-12-18 DIAGNOSIS — G8929 Other chronic pain: Secondary | ICD-10-CM | POA: Diagnosis not present

## 2020-12-22 ENCOUNTER — Encounter: Payer: PPO | Attending: Cardiology | Admitting: *Deleted

## 2020-12-22 ENCOUNTER — Other Ambulatory Visit: Payer: Self-pay

## 2020-12-22 DIAGNOSIS — Z955 Presence of coronary angioplasty implant and graft: Secondary | ICD-10-CM | POA: Diagnosis not present

## 2020-12-22 DIAGNOSIS — Z48812 Encounter for surgical aftercare following surgery on the circulatory system: Secondary | ICD-10-CM | POA: Insufficient documentation

## 2020-12-22 DIAGNOSIS — I252 Old myocardial infarction: Secondary | ICD-10-CM | POA: Diagnosis not present

## 2020-12-22 DIAGNOSIS — I213 ST elevation (STEMI) myocardial infarction of unspecified site: Secondary | ICD-10-CM

## 2020-12-22 NOTE — Progress Notes (Signed)
Daily Session Note  Patient Details  Name: Joshua Ferrell. MRN: [id number] Date of Birth: 12-11-1945 Referring Provider:   Flowsheet Row Cardiac Rehab from 11/03/2020 in [facility] Cardiac and Pulmonary Rehab  Referring Provider Isaias Cowman MD       Encounter Date: 12/22/2020  Check In:  Session Check In - 12/22/20 1007       Check-In   Supervising physician immediately available to respond to emergencies See telemetry face sheet for immediately available ER MD    Location [facility]-Cardiac & Pulmonary Rehab    Staff Present Heath Lark, RN, BSN, CCRP;Joseph Stryker, RCP,RRT,BSRT;Kelly Plato, BS, ACSM CEP, Exercise Physiologist    Virtual Visit No    Medication changes reported     No    Fall or balance concerns reported    No    Warm-up and Cool-down Performed on first and last piece of equipment    Resistance Training Performed Yes    VAD Patient? No    PAD/SET Patient? No      Pain Assessment   Currently in Pain? No/denies                Social History   Tobacco Use  Smoking Status Former   Packs/day: 0.00   Years: 0.00   Pack years: 0.00   Types: Cigarettes  Smokeless Tobacco Former   Types: Chew   Quit date: 1990    Goals Met:  Independence with exercise equipment Exercise tolerated well No report of concerns or symptoms today  Goals Unmet:  Not Applicable  Comments: Pt able to follow exercise prescription today without complaint.  Will continue to monitor for progression.    Dr. Emily Filbert is Medical Director for [place].  Dr. Ottie Glazier is Medical Director for [facility] Pulmonary Rehabilitation.

## 2020-12-24 ENCOUNTER — Other Ambulatory Visit: Payer: Self-pay

## 2020-12-24 DIAGNOSIS — Z955 Presence of coronary angioplasty implant and graft: Secondary | ICD-10-CM

## 2020-12-24 DIAGNOSIS — I213 ST elevation (STEMI) myocardial infarction of unspecified site: Secondary | ICD-10-CM

## 2020-12-24 DIAGNOSIS — Z48812 Encounter for surgical aftercare following surgery on the circulatory system: Secondary | ICD-10-CM | POA: Diagnosis not present

## 2020-12-24 NOTE — Progress Notes (Signed)
Daily Session Note  Patient Details  Name: Joshua Ferrell. MRN: [id number] Date of Birth: 1945/08/15 Referring Provider:   Flowsheet Row Cardiac Rehab from 11/03/2020 in [facility] Cardiac and Pulmonary Rehab  Referring Provider Isaias Cowman MD       Encounter Date: 12/24/2020  Check In:  Session Check In - 12/24/20 0920       Check-In   Supervising physician immediately available to respond to emergencies See telemetry face sheet for immediately available ER MD    Location [facility]-Cardiac & Pulmonary Rehab    Staff Present Birdie Sons, MPA, RN;Amanda Sommer, BA, ACSM CEP, Exercise Physiologist;Melissa Tilford Pillar, RDN, LDN    Virtual Visit No    Medication changes reported     No    Fall or balance concerns reported    Yes    Comments fell while walking in the clinic, shoulder is sore    Tobacco Cessation No Change    Warm-up and Cool-down Performed on first and last piece of equipment    Resistance Training Performed Yes    VAD Patient? No    PAD/SET Patient? No      Pain Assessment   Currently in Pain? No/denies                Social History   Tobacco Use  Smoking Status Former   Packs/day: 0.00   Years: 0.00   Pack years: 0.00   Types: Cigarettes  Smokeless Tobacco Former   Types: Chew   Quit date: 1990    Goals Met:  Independence with exercise equipment Exercise tolerated well No report of concerns or symptoms today Strength training completed today  Goals Unmet:  Not Applicable  Comments: Pt able to follow exercise prescription today without complaint.  Will continue to monitor for progression.    Dr. Emily Filbert is Medical Director for [place].  Dr. Ottie Glazier is Medical Director for [facility] Pulmonary Rehabilitation.

## 2020-12-29 ENCOUNTER — Other Ambulatory Visit: Payer: Self-pay

## 2020-12-29 DIAGNOSIS — I213 ST elevation (STEMI) myocardial infarction of unspecified site: Secondary | ICD-10-CM

## 2020-12-29 DIAGNOSIS — Z48812 Encounter for surgical aftercare following surgery on the circulatory system: Secondary | ICD-10-CM | POA: Diagnosis not present

## 2020-12-29 DIAGNOSIS — Z955 Presence of coronary angioplasty implant and graft: Secondary | ICD-10-CM

## 2020-12-29 DIAGNOSIS — M19012 Primary osteoarthritis, left shoulder: Secondary | ICD-10-CM | POA: Diagnosis not present

## 2020-12-29 DIAGNOSIS — M25512 Pain in left shoulder: Secondary | ICD-10-CM | POA: Diagnosis not present

## 2020-12-29 NOTE — Progress Notes (Signed)
Daily Session Note  Patient Details  Name: Joshua Ferrell. MRN: [id number] Date of Birth: 1945-06-20 Referring Provider:   Flowsheet Row Cardiac Rehab from 11/03/2020 in [facility] Cardiac and Pulmonary Rehab  Referring Provider Isaias Cowman MD       Encounter Date: 12/29/2020  Check In:  Session Check In - 12/29/20 0920       Check-In   Supervising physician immediately available to respond to emergencies See telemetry face sheet for immediately available ER MD    Location [facility]-Cardiac & Pulmonary Rehab    Staff Present Birdie Sons, MPA, RN;Joseph Dunn, RCP,RRT,BSRT;Jaymz Traywick Liberty, BS, ACSM CEP, Exercise Physiologist    Virtual Visit No    Medication changes reported     No    Fall or balance concerns reported    No    Tobacco Cessation No Change    Warm-up and Cool-down Performed on first and last piece of equipment    Resistance Training Performed Yes    VAD Patient? No    PAD/SET Patient? No      Pain Assessment   Currently in Pain? No/denies                Social History   Tobacco Use  Smoking Status Former   Packs/day: 0.00   Years: 0.00   Pack years: 0.00   Types: Cigarettes  Smokeless Tobacco Former   Types: Chew   Quit date: 1990    Goals Met:  Independence with exercise equipment Exercise tolerated well No report of concerns or symptoms today Strength training completed today  Goals Unmet:  Not Applicable  Comments: Pt able to follow exercise prescription today without complaint.  Will continue to monitor for progression.    Dr. Emily Filbert is Medical Director for [place].  Dr. Ottie Glazier is Medical Director for [facility] Pulmonary Rehabilitation.

## 2020-12-31 ENCOUNTER — Other Ambulatory Visit: Payer: Self-pay

## 2020-12-31 DIAGNOSIS — Z48812 Encounter for surgical aftercare following surgery on the circulatory system: Secondary | ICD-10-CM | POA: Diagnosis not present

## 2020-12-31 DIAGNOSIS — Z955 Presence of coronary angioplasty implant and graft: Secondary | ICD-10-CM

## 2020-12-31 DIAGNOSIS — I213 ST elevation (STEMI) myocardial infarction of unspecified site: Secondary | ICD-10-CM

## 2020-12-31 NOTE — Progress Notes (Signed)
Daily Session Note  Patient Details  Name: Joshua Ferrell. MRN: [id number] Date of Birth: [birth year] Referring Provider:   Flowsheet Row Cardiac Rehab from 11/03/2020 in [facility] Cardiac and Pulmonary Rehab  Referring Provider Isaias Cowman MD       Encounter Date: 12/31/2020  Check In:  Session Check In - 12/31/20 0911       Check-In   Supervising physician immediately available to respond to emergencies See telemetry face sheet for immediately available ER MD    Location [facility]-Cardiac & Pulmonary Rehab    Staff Present Birdie Sons, MPA, Elveria Rising, BA, ACSM CEP, Exercise Physiologist;Joseph Declo, RCP,RRT,BSRT;Melissa Delavan, RDN, LDN    Virtual Visit No    Medication changes reported     No    Fall or balance concerns reported    No    Tobacco Cessation No Change    Warm-up and Cool-down Performed on first and last piece of equipment    Resistance Training Performed Yes    VAD Patient? No    PAD/SET Patient? No      Pain Assessment   Currently in Pain? No/denies                Social History   Tobacco Use  Smoking Status Former   Packs/day: 0.00   Years: 0.00   Pack years: 0.00   Types: Cigarettes  Smokeless Tobacco Former   Types: Chew   Quit date: 1990    Goals Met:  Independence with exercise equipment Exercise tolerated well No report of concerns or symptoms today Strength training completed today  Goals Unmet:  Not Applicable  Comments: Pt able to follow exercise prescription today without complaint.  Will continue to monitor for progression.    Dr. Emily Filbert is Medical Director for [place].  Dr. Ottie Glazier is Medical Director for [facility] Pulmonary Rehabilitation.

## 2021-01-02 ENCOUNTER — Other Ambulatory Visit: Payer: Self-pay

## 2021-01-02 DIAGNOSIS — Z48812 Encounter for surgical aftercare following surgery on the circulatory system: Secondary | ICD-10-CM | POA: Diagnosis not present

## 2021-01-02 DIAGNOSIS — Z955 Presence of coronary angioplasty implant and graft: Secondary | ICD-10-CM

## 2021-01-02 DIAGNOSIS — I213 ST elevation (STEMI) myocardial infarction of unspecified site: Secondary | ICD-10-CM

## 2021-01-02 NOTE — Progress Notes (Signed)
Daily Session Note  Patient Details  Name: Joshua Ferrell. MRN: [id number] Date of Birth: 1945-03-30 Referring Provider:   Flowsheet Row Cardiac Rehab from 11/03/2020 in [facility] Cardiac and Pulmonary Rehab  Referring Provider Isaias Cowman MD       Encounter Date: 01/02/2021  Check In:  Session Check In - 01/02/21 0945       Check-In   Supervising physician immediately available to respond to emergencies See telemetry face sheet for immediately available ER MD    Location [facility]-Cardiac & Pulmonary Rehab    Staff Present Vida Rigger, RN, BSN;Joseph [place], RCP,RRT,BSRT;Jessica [place], [place], RCEP, CCRP, CCET    Virtual Visit No    Medication changes reported     No    Fall or balance concerns reported    No    Tobacco Cessation No Change    Warm-up and Cool-down Performed on first and last piece of equipment    Resistance Training Performed Yes    VAD Patient? No    PAD/SET Patient? No      Pain Assessment   Currently in Pain? No/denies                Social History   Tobacco Use  Smoking Status Former   Packs/day: 0.00   Years: 0.00   Pack years: 0.00   Types: Cigarettes  Smokeless Tobacco Former   Types: Chew   Quit date: 1990    Goals Met:  Independence with exercise equipment Exercise tolerated well No report of concerns or symptoms today Strength training completed today  Goals Unmet:  Not Applicable  Comments: Pt able to follow exercise prescription today without complaint.  Will continue to monitor for progression.   Dr. Emily Filbert is Medical Director for [place].  Dr. Ottie Glazier is Medical Director for [facility] Pulmonary Rehabilitation.

## 2021-01-05 ENCOUNTER — Other Ambulatory Visit: Payer: Self-pay

## 2021-01-05 ENCOUNTER — Encounter: Payer: PPO | Admitting: *Deleted

## 2021-01-05 DIAGNOSIS — Z955 Presence of coronary angioplasty implant and graft: Secondary | ICD-10-CM

## 2021-01-05 DIAGNOSIS — Z48812 Encounter for surgical aftercare following surgery on the circulatory system: Secondary | ICD-10-CM | POA: Diagnosis not present

## 2021-01-05 DIAGNOSIS — I213 ST elevation (STEMI) myocardial infarction of unspecified site: Secondary | ICD-10-CM

## 2021-01-05 NOTE — Progress Notes (Signed)
Daily Session Note  Patient Details  Name: Tavius Turgeon. MRN: [id number] Date of Birth: February 03, 1945 Referring Provider:   Flowsheet Row Cardiac Rehab from 11/03/2020 in [facility] Cardiac and Pulmonary Rehab  Referring Provider Isaias Cowman MD       Encounter Date: 01/05/2021  Check In:  Session Check In - 01/05/21 0930       Check-In   Supervising physician immediately available to respond to emergencies See telemetry face sheet for immediately available ER MD    Location [facility]-Cardiac & Pulmonary Rehab    Staff Present Heath Lark, RN, BSN, Laveda Norman, BS, ACSM CEP, Exercise Physiologist;Joseph [place], [place]    Virtual Visit No    Medication changes reported     No    Fall or balance concerns reported    No    Warm-up and Cool-down Performed on first and last piece of equipment    Resistance Training Performed Yes    VAD Patient? No    PAD/SET Patient? No      Pain Assessment   Currently in Pain? No/denies                Social History   Tobacco Use  Smoking Status Former   Packs/day: 0.00   Years: 0.00   Pack years: 0.00   Types: Cigarettes  Smokeless Tobacco Former   Types: Chew   Quit date: 1990    Goals Met:  Independence with exercise equipment Exercise tolerated well No report of concerns or symptoms today  Goals Unmet:  Not Applicable  Comments: Pt able to follow exercise prescription today without complaint.  Will continue to monitor for progression.    Dr. Emily Filbert is Medical Director for [place].  Dr. Ottie Glazier is Medical Director for [facility] Pulmonary Rehabilitation.

## 2021-01-07 ENCOUNTER — Other Ambulatory Visit: Payer: Self-pay

## 2021-01-07 DIAGNOSIS — Z955 Presence of coronary angioplasty implant and graft: Secondary | ICD-10-CM

## 2021-01-07 DIAGNOSIS — Z48812 Encounter for surgical aftercare following surgery on the circulatory system: Secondary | ICD-10-CM | POA: Diagnosis not present

## 2021-01-07 DIAGNOSIS — I213 ST elevation (STEMI) myocardial infarction of unspecified site: Secondary | ICD-10-CM

## 2021-01-07 NOTE — Progress Notes (Signed)
Daily Session Note  Patient Details  Name: Joshua Ferrell. MRN: [id number] Date of Birth: 1945/11/30 Referring Provider:   Flowsheet Row Cardiac Rehab from 11/03/2020 in [facility] Cardiac and Pulmonary Rehab  Referring Provider Isaias Cowman MD       Encounter Date: 01/07/2021  Check In:  Session Check In - 01/07/21 0914       Check-In   Supervising physician immediately available to respond to emergencies See telemetry face sheet for immediately available ER MD    Location [facility]-Cardiac & Pulmonary Rehab    Staff Present Birdie Sons, MPA, Elveria Rising, BA, ACSM CEP, Exercise Physiologist;Joseph Tessie [place], [place]    Virtual Visit No    Medication changes reported     No    Fall or balance concerns reported    No    Tobacco Cessation No Change    Warm-up and Cool-down Performed on first and last piece of equipment    Resistance Training Performed Yes    VAD Patient? No    PAD/SET Patient? No      Pain Assessment   Currently in Pain? No/denies                Social History   Tobacco Use  Smoking Status Former   Packs/day: 0.00   Years: 0.00   Pack years: 0.00   Types: Cigarettes  Smokeless Tobacco Former   Types: Chew   Quit date: 1990    Goals Met:  Independence with exercise equipment Exercise tolerated well No report of concerns or symptoms today Strength training completed today  Goals Unmet:  Not Applicable  Comments: Pt able to follow exercise prescription today without complaint.  Will continue to monitor for progression.    Dr. Emily Filbert is Medical Director for [place].  Dr. Ottie Glazier is Medical Director for [facility] Pulmonary Rehabilitation.

## 2021-01-09 ENCOUNTER — Other Ambulatory Visit: Payer: Self-pay

## 2021-01-09 ENCOUNTER — Encounter: Payer: PPO | Admitting: *Deleted

## 2021-01-09 DIAGNOSIS — Z955 Presence of coronary angioplasty implant and graft: Secondary | ICD-10-CM

## 2021-01-09 DIAGNOSIS — Z48812 Encounter for surgical aftercare following surgery on the circulatory system: Secondary | ICD-10-CM | POA: Diagnosis not present

## 2021-01-09 DIAGNOSIS — I213 ST elevation (STEMI) myocardial infarction of unspecified site: Secondary | ICD-10-CM

## 2021-01-09 NOTE — Progress Notes (Signed)
Daily Session Note  Patient Details  Name: Joshua Ferrell. MRN: [id number] Date of Birth: Jan 17, 1946 Referring Provider:   Flowsheet Row Cardiac Rehab from 11/03/2020 in [facility] Cardiac and Pulmonary Rehab  Referring Provider Isaias Cowman MD       Encounter Date: 01/09/2021  Check In:  Session Check In - 01/09/21 9629       Check-In   Supervising physician immediately available to respond to emergencies See telemetry face sheet for immediately available ER MD    Location [facility]-Cardiac & Pulmonary Rehab    Staff Present Renita Papa, RN BSN;Joseph [place], RCP,RRT,BSRT;Jessica [place], [place], RCEP, CCRP, CCET    Virtual Visit No    Medication changes reported     No    Fall or balance concerns reported    No    Warm-up and Cool-down Performed on first and last piece of equipment    Resistance Training Performed Yes    VAD Patient? No    PAD/SET Patient? No      Pain Assessment   Currently in Pain? No/denies                Social History   Tobacco Use  Smoking Status Former   Packs/day: 0.00   Years: 0.00   Pack years: 0.00   Types: Cigarettes  Smokeless Tobacco Former   Types: Chew   Quit date: 1990    Goals Met:  Independence with exercise equipment Exercise tolerated well No report of concerns or symptoms today Strength training completed today  Goals Unmet:  Not Applicable  Comments: Pt able to follow exercise prescription today without complaint.  Will continue to monitor for progression.    Dr. Emily Filbert is Medical Director for [place].  Dr. Ottie Glazier is Medical Director for [facility] Pulmonary Rehabilitation.

## 2021-01-14 ENCOUNTER — Encounter: Payer: PPO | Admitting: *Deleted

## 2021-01-14 ENCOUNTER — Encounter: Payer: Self-pay | Admitting: *Deleted

## 2021-01-14 ENCOUNTER — Other Ambulatory Visit: Payer: Self-pay

## 2021-01-14 DIAGNOSIS — Z955 Presence of coronary angioplasty implant and graft: Secondary | ICD-10-CM

## 2021-01-14 DIAGNOSIS — I213 ST elevation (STEMI) myocardial infarction of unspecified site: Secondary | ICD-10-CM

## 2021-01-14 DIAGNOSIS — Z48812 Encounter for surgical aftercare following surgery on the circulatory system: Secondary | ICD-10-CM | POA: Diagnosis not present

## 2021-01-14 NOTE — Progress Notes (Signed)
Cardiac Individual Treatment Plan  Patient Details  Name: Joshua Ferrell. MRN: [id number] Date of Birth: 04-28-1945 Referring Provider:   Flowsheet Row Cardiac Rehab from 11/03/2020 in [facility] Cardiac and Pulmonary Rehab  Referring Provider Isaias Cowman MD       Initial Encounter Date:  Flowsheet Row Cardiac Rehab from 11/03/2020 in [facility] Cardiac and Pulmonary Rehab  Date 11/03/20       Visit Diagnosis: ST elevation myocardial infarction (STEMI), unspecified artery [facility])  Status post coronary artery stent placement  Patient's Home Medications on Admission:  Current Outpatient Medications:    acetaminophen (TYLENOL) 650 MG CR tablet, Take by mouth. (Patient not taking: Reported on 10/24/2020), Disp: , Rfl:    amLODipine (NORVASC) 5 MG tablet, Take 5 mg by mouth daily., Disp: , Rfl:    aspirin EC 81 MG tablet, Take 81 mg by mouth daily., Disp: , Rfl:    clopidogrel (PLAVIX) 75 MG tablet, Take 1 tablet (75 mg total) by mouth daily with breakfast., Disp: 30 tablet, Rfl: 1   ezetimibe (ZETIA) 10 MG tablet, Take 1 tablet (10 mg total) by mouth daily., Disp: 30 tablet, Rfl: 1   glimepiride (AMARYL) 2 MG tablet, Take 2 mg by mouth daily with breakfast., Disp: , Rfl:    Investigational - Study Medication, Take 14 mg by mouth daily before breakfast. Study name: semaglutide 14 mg or placebo, Disp: , Rfl:    metFORMIN (GLUCOPHAGE) 500 MG tablet, Take 1,000 mg by mouth 2 (two) times daily with a meal., Disp: , Rfl:    metoprolol succinate (TOPROL-XL) 50 MG 24 hr tablet, Take 1 tablet (50 mg total) by mouth daily. Take with or immediately following a meal., Disp: 30 tablet, Rfl: 1   nitroGLYCERIN (NITROSTAT) 0.4 MG SL tablet, Place 0.4 mg under the tongue every 5 (five) minutes as needed for chest pain., Disp: , Rfl:    valsartan-hydrochlorothiazide (DIOVAN-HCT) 160-12.5 MG tablet, Take 1 tablet by mouth daily., Disp: , Rfl:   Past Medical History: Past Medical History:  Diagnosis  Date   Arthritis    Coronary artery disease    COVID-19 12/2018   Diabetes mellitus without complication (HCC)    Dyspnea    with exertion   GERD (gastroesophageal reflux disease)    minimal episodes   Headache(784.0)    Heart disease    History of blood transfusion 1990   internal injuries after a fall requiring blood   History of colonic polyps    Hyperglycemia    Hyperlipidemia    Hypertension    Myocardial infarction ([place]) 2003   PONV (postoperative nausea and vomiting)    one time only (surgery in 1990)    Tobacco Use: Social History   Tobacco Use  Smoking Status Former   Packs/day: 0.00   Years: 0.00   Pack years: 0.00   Types: Cigarettes  Smokeless Tobacco Former   Types: Chew   Quit date: 1990    Labs: 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   Exercise Prescription Goal: Initial exercise prescription builds to 30-45 minutes a day of aerobic activity, 2-3 days per week.  Home exercise guidelines will be  given to patient during program as part of exercise prescription that the participant will acknowledge.   Education: Aerobic Exercise: - Group verbal and visual presentation on the components of exercise prescription. Introduces F.I.T.T principle from ACSM for exercise prescriptions.  Reviews F.I.T.T. principles of aerobic exercise including progression. Written material given at graduation. Flowsheet Row Cardiac Rehab from 01/07/2021 in [facility] Cardiac and Pulmonary Rehab  Education need identified 11/03/20  Date 11/12/20  Educator [facility]  Instruction Review Code 1- Verbalizes Understanding       Education: Resistance Exercise: - Group verbal and visual  presentation on the components of exercise prescription. Introduces F.I.T.T principle from ACSM for exercise prescriptions  Reviews F.I.T.T. principles of resistance exercise including progression. Written material given at graduation.    Education: Exercise & Equipment Safety: - Individual verbal instruction and demonstration of equipment use and safety with use of the equipment. Flowsheet Row Cardiac Rehab from 01/07/2021 in [facility] Cardiac and Pulmonary Rehab  Date 11/03/20  Educator [facility]  Instruction Review Code 1- Verbalizes Understanding       Education: Exercise Physiology & General Exercise Guidelines: - Group verbal and written instruction with models to review the exercise physiology of the cardiovascular system and associated critical values. Provides general exercise guidelines with specific guidelines to those with heart or lung disease.  Flowsheet Row Cardiac Rehab from 01/07/2021 in [facility] Cardiac and Pulmonary Rehab  Date 01/07/21  Educator [facility]  Instruction Review Code 1- Verbalizes Understanding       Education: Flexibility, Balance, Mind/Body Relaxation: - Group verbal and visual presentation with interactive activity on the components of exercise prescription. Introduces F.I.T.T principle from ACSM for exercise prescriptions. Reviews F.I.T.T. principles of flexibility and balance exercise training including progression. Also discusses the mind body connection.  Reviews various relaxation techniques to help reduce and manage stress (i.e. Deep breathing, progressive muscle relaxation, and visualization). Balance handout provided to take home. Written material given at graduation. Flowsheet Row Cardiac Rehab from 01/07/2021 in [facility] Cardiac and Pulmonary Rehab  Date 11/26/20  Educator [facility]  Instruction Review Code 1- Verbalizes Understanding       Activity Barriers & Risk Stratification:  Activity Barriers & Cardiac Risk Stratification - 10/24/20 1304       Activity Barriers  & Cardiac Risk Stratification   Activity Barriers Arthritis;Joint Problems    Cardiac Risk Stratification High             6 Minute Walk:  6 Minute Walk     Row Name 11/03/20 0948 01/07/21 1232       6 Minute Walk   Phase Initial Discharge    Distance 1220 feet 1400 feet    Distance % Change -- 14.7 %    Distance Feet Change -- 180 ft    Walk Time 6 minutes 6 minutes    # of Rest Breaks 0 0    MPH 2.31 2.65    METS 2.24 2.77    RPE 11 13    Perceived Dyspnea  -- 1    VO2 Peak 7.85 9.7    Symptoms Yes (comment) No    Comments chronic hip pain 5/10 --    Resting HR 62 bpm 90 bpm    Resting BP 126/72 122/64    Resting Oxygen Saturation  97 % 98 %    Exercise Oxygen Saturation  during 6 min walk 96 % 96 %    Max Ex. HR 97 bpm 109 bpm    Max Ex. BP 158/74 164/78  2 Minute Post BP 134/66 --             Oxygen Initial Assessment:   Oxygen Re-Evaluation:   Oxygen Discharge (Final Oxygen Re-Evaluation):   Initial Exercise Prescription:  Initial Exercise Prescription - 11/03/20 0900       Date of Initial Exercise RX and Referring Provider   Date 11/03/20    Referring Provider Paraschos, Alexander MD      Oxygen   Maintain Oxygen Saturation 88% or higher      Treadmill   MPH 2    Grade 0.5    Minutes 15    METs 2.67      Recumbant Bike   Level 2    RPM 50    Watts 10    Minutes 15    METs 2      REL-XR   Level 2    Speed 50    Minutes 15    METs 2      Prescription Details   Frequency (times per week) 3    Duration Progress to 30 minutes of continuous aerobic without signs/symptoms of physical distress      Intensity   THRR 40-80% of Max Heartrate 94-127    Ratings of Perceived Exertion 11-13    Perceived Dyspnea 0-4      Progression   Progression Continue to progress workloads to maintain intensity without signs/symptoms of physical distress.      Resistance Training   Training Prescription Yes    Weight 4 lb    Reps 10-15              Perform Capillary Blood Glucose checks as needed.  Exercise Prescription Changes:   Exercise Prescription Changes     Row Name 11/03/20 0900 11/17/20 1000 12/02/20 1400 12/16/20 0800 12/29/20 0900     Response to Exercise   Blood Pressure (Admit) 126/72 122/64 132/70 110/62 126/78   Blood Pressure (Exercise) 158/74 142/74 150/92 -- --   Blood Pressure (Exit) 134/66 120/64 126/58 110/60 124/68   Heart Rate (Admit) 62 bpm 70 bpm 65 bpm 64 bpm 69 bpm   Heart Rate (Exercise) 97 bpm 95 bpm 113 bpm 98 bpm 100 bpm   Heart Rate (Exit) 60 bpm 69 bpm 73 bpm 87 bpm 77 bpm   Oxygen Saturation (Admit) 97 % -- -- -- --   Oxygen Saturation (Exercise) 96 % -- -- -- --   Rating of Perceived Exertion (Exercise) _0 Symptoms chronic hip pain 5/10 none none none none   Comments walk test results 2nd full week of exercise -- -- --   Duration -- Progress to 30 minutes of  aerobic without signs/symptoms of physical distress Progress to 30 minutes of  aerobic without signs/symptoms of physical distress Continue with 30 min of aerobic exercise without signs/symptoms of physical distress. Continue with 30 min of aerobic exercise without signs/symptoms of physical distress.   Intensity -- THRR unchanged THRR unchanged THRR unchanged THRR unchanged     Progression   Progression -- Continue to progress workloads to maintain intensity without signs/symptoms of physical distress. Continue to progress workloads to maintain intensity without signs/symptoms of physical distress. Continue to progress workloads to maintain intensity without signs/symptoms of physical distress. Continue to progress workloads to maintain intensity without signs/symptoms of physical distress.   Average METs -- 3.11 3.35 3.36 3.35     Resistance Training   Training Prescription -- Yes Yes Yes Yes  Weight -- 4 lb 5 lb 5 lb 5 lb   Reps -- 10-15 10-15 10-15 10-15     Interval Training   Interval Training -- No  No No No     Treadmill   MPH -- 2.5 2._0 Grade -- 0.5 1 1.5 1.5   Minutes -- _1 METs -- 3.09 3.35 3.92 3.92     Recumbant Bike   Level -- 2 -- 3 3   Watts -- -- -- 25 26   Minutes -- 15 -- 15 15   METs -- 2.31 -- 2.92 2.92     REL-XR   Level -- _2 Minutes -- _3 METs -- 3.2 -- -- --     Home Exercise Plan   Plans to continue exercise at -- -- -- -- Home (comment)  walking, join gym   Frequency -- -- -- -- Add 2 additional days to program exercise sessions.   Initial Home Exercises Provided -- -- -- -- 12/22/20            Exercise Comments:   Exercise Comments     Row Name 11/05/20 0925           Exercise Comments First full day of exercise!  Patient was oriented to gym and equipment including functions, settings, policies, and procedures.  Patient's individual exercise prescription and treatment plan were reviewed.  All starting workloads were established based on the results of the 6 minute walk test done at initial orientation visit.  The plan for exercise progression was also introduced and progression will be customized based on patient's performance and goals.                Exercise Goals and Review:   Exercise Goals     Row Name 11/03/20 1002             Exercise Goals   Increase Physical Activity Yes       Intervention Provide advice, education, support and counseling about physical activity/exercise needs.;Develop an individualized exercise prescription for aerobic and resistive training based on initial evaluation findings, risk stratification, comorbidities and participant's personal goals.       Expected Outcomes Short Term: Attend rehab on a regular basis to increase amount of physical activity.;Long Term: Add in home exercise to make exercise part of routine and to increase amount of physical activity.;Long Term: Exercising regularly at least 3-5 days a week.       Increase Strength and Stamina Yes        Intervention Provide advice, education, support and counseling about physical activity/exercise needs.;Develop an individualized exercise prescription for aerobic and resistive training based on initial evaluation findings, risk stratification, comorbidities and participant's personal goals.       Expected Outcomes Short Term: Increase workloads from initial exercise prescription for resistance, speed, and METs.;Short Term: Perform resistance training exercises routinely during rehab and add in resistance training at home;Long Term: Improve cardiorespiratory fitness, muscular endurance and strength as measured by increased METs and functional capacity (6MWT)       Able to understand and use rate of perceived exertion (RPE) scale Yes       Intervention Provide education and explanation on how to use RPE scale       Expected Outcomes Short Term: Able to use RPE daily in rehab to express subjective intensity level;Long Term:  Able to use RPE to  guide intensity level when exercising independently       Able to understand and use Dyspnea scale Yes       Intervention Provide education and explanation on how to use Dyspnea scale       Expected Outcomes Short Term: Able to use Dyspnea scale daily in rehab to express subjective sense of shortness of breath during exertion;Long Term: Able to use Dyspnea scale to guide intensity level when exercising independently       Knowledge and understanding of Target Heart Rate Range (THRR) Yes       Intervention Provide education and explanation of THRR including how the numbers were predicted and where they are located for reference       Expected Outcomes Short Term: Able to state/look up THRR;Short Term: Able to use daily as guideline for intensity in rehab;Long Term: Able to use THRR to govern intensity when exercising independently       Able to check pulse independently Yes       Intervention Provide education and demonstration on how to check pulse in carotid and  radial arteries.;Review the importance of being able to check your own pulse for safety during independent exercise       Expected Outcomes Short Term: Able to explain why pulse checking is important during independent exercise;Long Term: Able to check pulse independently and accurately       Understanding of Exercise Prescription Yes       Intervention Provide education, explanation, and written materials on patient's individual exercise prescription       Expected Outcomes Long Term: Able to explain home exercise prescription to exercise independently;Short Term: Able to explain program exercise prescription                Exercise Goals Re-Evaluation :  Exercise Goals Re-Evaluation     Row Name 11/05/20 0925 11/17/20 1017 12/02/20 1441 12/16/20 0834 12/22/20 0930     Exercise Goal Re-Evaluation   Exercise Goals Review Increase Physical Activity;Understanding of Exercise Prescription;Knowledge and understanding of Target Heart Rate Range (THRR);Able to understand and use rate of perceived exertion (RPE) scale;Increase Strength and Stamina;Able to understand and use Dyspnea scale;Able to check pulse independently Increase Physical Activity;Increase Strength and Stamina Increase Physical Activity;Increase Strength and Stamina Increase Physical Activity;Increase Strength and Stamina;Understanding of Exercise Prescription Increase Physical Activity;Increase Strength and Stamina   Comments Reviewed RPE and dyspnea scales, THR and program prescription with pt today.  Pt voiced understanding and was given a copy of goals to take home. Joshua Ferrell is doing well for the first couple of weeks that he has been here. He is tolerating his exercise prescription well and is hitting his [place] so far. Will continue to monitor as he progresses through the program. Joshua Ferrell continues to do well and has increased TM to 2.6 mph and 1% incline.  He uses 5 lb for strength training. We will monitor progress. Joshua Ferrell is doing well  in rehab.  He is up to 3 mph on the treadmill.  He has not been turning in his METs from the XR and we will work with him on this.  We will continue to monitor his progress. Joshua Ferrell has visited [facility] or [place] to maintain exercise after completing HT.   Expected Outcomes Short: Use RPE daily to regulate intensity. Long: Follow program prescription in THR. Short: Continue current exercise prescription and start to increase loads as tolerated Long: Increase overall MET level Short:  attend consistently and reach  THR range Long:  build overall stamina Short: Turn in METs from XR Long: Continue to improve stamina. --    Row Name 12/22/20 0944 12/29/20 0932           Exercise Goal Re-Evaluation   Exercise Goals Review -- Increase Physical Activity;Increase Strength and Stamina;Understanding of Exercise Prescription      Comments Reviewed home exercise with pt today.  Pt plans to join a fitness center for exercise.  Reviewed THR, pulse, RPE, sign and symptoms, pulse oximetery and when to call 911 or MD.  Also discussed weather considerations and indoor options.  Pt voiced understanding. Joshua Ferrell is doing well in rehab.  He is up to 3.0 mph on the treadmill and on level 3 for the bike.  We will continue to monitor his progress.      Expected Outcomes Short:  add in 2-3 days of exercise outside program sessions Long:  maintain exercise on his own Short: Report METs from XR Long: continue to improve stamina               Discharge Exercise Prescription (Final Exercise Prescription Changes):  Exercise Prescription Changes - 12/29/20 0900       Response to Exercise   Blood Pressure (Admit) 126/78    Blood Pressure (Exit) 124/68    Heart Rate (Admit) 69 bpm    Heart Rate (Exercise) 100 bpm    Heart Rate (Exit) 77 bpm    Rating of Perceived Exertion (Exercise) 13    Symptoms none    Duration Continue with 30 min of aerobic exercise without signs/symptoms of physical distress.     Intensity THRR unchanged      Progression   Progression Continue to progress workloads to maintain intensity without signs/symptoms of physical distress.    Average METs 3.35      Resistance Training   Training Prescription Yes    Weight 5 lb    Reps 10-15      Interval Training   Interval Training No      Treadmill   MPH 3    Grade 1.5    Minutes 15    METs 3.92      Recumbant Bike   Level 3    Watts 26    Minutes 15    METs 2.92      REL-XR   Level 4    Minutes 15      Home Exercise Plan   Plans to continue exercise at Home (comment)   walking, join gym   Frequency Add 2 additional days to program exercise sessions.    Initial Home Exercises Provided 12/22/20             Nutrition:  Target Goals: Understanding of nutrition guidelines, daily intake of sodium <1572m, cholesterol <2030m calories 30% from fat and 7% or less from saturated fats, daily to have 5 or more servings of fruits and vegetables.  Education: All About Nutrition: -Group instruction provided by verbal, written material, interactive activities, discussions, models, and posters to present general guidelines for heart healthy nutrition including fat, fiber, MyPlate, the role of sodium in heart healthy nutrition, utilization of the nutrition label, and utilization of this knowledge for meal planning. Follow up email sent as well. Written material given at graduation. Flow[facility]  Education need identified 11/03/20  Date 12/03/20  Educator [facility] Clinic[facility]ruction Review Code 1- VeUnited States Steel Corporationnderstanding  Biometrics:  Pre Biometrics - 11/03/20 1002       Pre Biometrics   Height 5' 8.2" (1.732 m)    Weight 216 lb 14.4 oz (98.4 kg)    BMI (Calculated) 32.8    Single Leg Stand 2.5 seconds              Nutrition Therapy Plan and Nutrition Goals:  Nutrition Therapy & Goals - 11/10/20 0845       Nutrition Therapy    Drug/Food Interactions Food/Disease    Protein (specify units) 75g    Fiber 30 grams    Whole Grain Foods 3 servings    Saturated Fats 12 max. grams    Fruits and Vegetables 8 servings/day    Sodium 1.5 grams      Personal Nutrition Goals   Nutrition Goal ST: 2-4 CHO servings at meals (make sure to have at least 2 CHO servings for breakfast to avoid BG lows during exercise) LT: Include at least 5 fruit/vegetable servings per day, limit processed meat to <3x/week    Comments On investigational study medication: semaglutide 14 mg or placebo, Historical Med. Also on Metformin. A1C 5.8 per patient last month. B: may or may not cook - bacon or sausage with eggs and sometimes toast (whole wheat bread). Ham and egg on bun 1x/week. Cereal once in a while. Always coffee (black) S: cookie with maybe a diet coke L: Ham or [place] sandwich. Cooks chicken wings or leftovers. D: grills chicken, hamburger eveyr once in a while, fish. Green beans, sugar peas, corn, cauliflower, cabbage, collards, "whatever strikes me". Drinks: 1-3 beers before dinner and water. Discussed heart healthy eating and focused on diabetes friendly eating due to time constraints. Patient will need review/follow up education on general heart healhty eating and meal planning.      Intervention Plan   Intervention Prescribe, educate and counsel regarding individualized specific dietary modifications aiming towards targeted core components such as weight, hypertension, lipid management, diabetes, heart failure and other comorbidities.    Expected Outcomes Short Term Goal: Understand basic principles of dietary content, such as calories, fat, sodium, cholesterol and nutrients.;Short Term Goal: A plan has been developed with personal nutrition goals set during dietitian appointment.;Long Term Goal: Adherence to prescribed nutrition plan.             Nutrition Assessments:  MEDIFICTS Score Key: ?70 Need to make dietary changes  40-70  Heart Healthy Diet ? 40 Therapeutic Level Cholesterol Diet  Flowsheet Row Cardiac Rehab from 11/03/2020 in [facility] Cardiac and Pulmonary Rehab  Picture Your Plate Total Score on Admission 72      Picture Your Plate Scores: <44 Unhealthy dietary pattern with much room for improvement. 41-50 Dietary pattern unlikely to meet recommendations for good health and room for improvement. 51-60 More healthful dietary pattern, with some room for improvement.  >60 Healthy dietary pattern, although there may be some specific behaviors that could be improved.    Nutrition Goals Re-Evaluation:  Nutrition Goals Re-Evaluation     [place] Name 12/22/20 0927             Goals   Comment Joshua Ferrell is still working on improving diet.  He tries to eat small portions.       Expected Outcome Short: continue to add in fruits and vegetables Long:  maintain heart healthy diet                Nutrition Goals Discharge (Final Nutrition Goals Re-Evaluation):  Nutrition Goals Re-Evaluation -  12/22/20 8850       Goals   Comment Joshua Ferrell is still working on improving diet.  He tries to eat small portions.    Expected Outcome Short: continue to add in fruits and vegetables Long:  maintain heart healthy diet             Psychosocial: Target Goals: Acknowledge presence or absence of significant depression and/or stress, maximize coping skills, provide positive support system. Participant is able to verbalize types and ability to use techniques and skills needed for reducing stress and depression.   Education: Stress, Anxiety, and Depression - Group verbal and visual presentation to define topics covered.  Reviews how body is impacted by stress, anxiety, and depression.  Also discusses healthy ways to reduce stress and to treat/manage anxiety and depression.  Written material given at graduation. Flowsheet Row Cardiac Rehab from 01/07/2021 in [facility] Cardiac and Pulmonary Rehab  Education need identified 11/03/20        Education: Sleep Hygiene -Provides group verbal and written instruction about how sleep can affect your health.  Define sleep hygiene, discuss sleep cycles and impact of sleep habits. Review good sleep hygiene tips.    Initial Review & Psychosocial Screening:  Initial Psych Review & Screening - 10/24/20 1307       Initial Review   Current issues with None Identified      Family Dynamics   Good Support System? Yes   friends     Barriers   Psychosocial barriers to participate in program There are no identifiable barriers or psychosocial needs.;The patient should benefit from training in stress management and relaxation.      Screening Interventions   Interventions Encouraged to exercise;Provide feedback about the scores to participant;To provide support and resources with identified psychosocial needs    Expected Outcomes Short Term goal: Utilizing psychosocial counselor, staff and physician to assist with identification of specific Stressors or current issues interfering with healing process. Setting desired goal for each stressor or current issue identified.;Long Term Goal: Stressors or current issues are controlled or eliminated.;Short Term goal: Identification and review with participant of any Quality of Life or Depression concerns found by scoring the questionnaire.;Long Term goal: The participant improves quality of Life and PHQ9 Scores as seen by post scores and/or verbalization of changes             Quality of Life Scores:   Quality of Life - 11/03/20 1003       Quality of Life   Select Quality of Life      Quality of Life Scores   Health/Function Pre 26.4 %    Socioeconomic Pre 30 %    Psych/Spiritual Pre 30 %    Family Pre 30 %    GLOBAL Pre 28.41 %            Scores of 19 and below usually indicate a poorer quality of life in these areas.  A difference of  2-3 points is a clinically meaningful difference.  A difference of 2-3 points in the total score of  the Quality of Life Index has been associated with significant improvement in overall quality of life, self-image, physical symptoms, and general health in studies assessing change in quality of life.  PHQ-9: Recent Review Flowsheet Data     Depression screen [facility] 2/9 11/03/2020   Decreased Interest 0   Down, Depressed, Hopeless 0   PHQ - 2 Score 0   Altered sleeping 0   Tired, decreased energy 0  Change in appetite 0   Feeling bad or failure about yourself  0   Trouble concentrating 0   Moving slowly or fidgety/restless 0   Suicidal thoughts 0   PHQ-9 Score 0   Difficult doing work/chores Not difficult at all      Interpretation of Total Score  Total Score Depression Severity:  1-4 = Minimal depression, 5-9 = Mild depression, 10-14 = Moderate depression, 15-19 = Moderately severe depression, 20-27 = Severe depression   Psychosocial Evaluation and Intervention:  Psychosocial Evaluation - 10/24/20 1319       Psychosocial Evaluation & Interventions   Interventions Encouraged to exercise with the program and follow exercise prescription    Comments Mr. 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 He is back to working outside which keeps him busy most days. He does not report any stress concerns. He has a lot of friends that make sure he is doing well, four that stop by daily to socialize. 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 He is proud of himself and is motivated to keep working to feel better. He wants to lose some weight and maintain his stamina and independence    Expected Outcomes Short: attend cardiac rehab for education and exercise. Long: develop and maintain positive self care habits.    Continue Psychosocial Services  Follow up required by staff             Psychosocial Re-Evaluation:  Psychosocial Re-Evaluation     [place] Name 12/22/20 0929              Psychosocial Re-Evaluation   Comments Joshua Ferrell reports no symptoms of stress, anxiety or depression.  He says he sleeps well.       Expected Outcomes Short: notify staff if any changes in mental health status Long:  maintain positive outlook                Psychosocial Discharge (Final Psychosocial Re-Evaluation):  Psychosocial Re-Evaluation - 12/22/20 0929       Psychosocial Re-Evaluation   Comments Joshua Ferrell reports no symptoms of stress, anxiety or depression.  He says he sleeps well.    Expected Outcomes Short: notify staff if any changes in mental health status Long:  maintain positive outlook             Vocational Rehabilitation: Provide vocational rehab assistance to qualifying candidates.   Vocational Rehab Evaluation & Intervention:  Vocational Rehab - 10/24/20 1318       Initial Vocational Rehab Evaluation & Intervention   Assessment shows need for Vocational Rehabilitation No             Education: Education Goals: Education classes will be provided on a variety of topics geared toward better understanding of heart health and risk factor modification. Participant will state understanding/return demonstration of topics presented as noted by education test scores.  Learning Barriers/Preferences:  Learning Barriers/Preferences - 10/24/20 1318       Learning Barriers/Preferences   Learning Barriers None    Learning Preferences None             General Cardiac Education Topics:  AED/CPR: - Group verbal and written instruction with the use of models to demonstrate the basic use of the AED with the basic ABC's of resuscitation.   Anatomy and Cardiac Procedures: - Group verbal and visual  presentation and models provide information about basic cardiac anatomy and function. Reviews the testing methods done to diagnose heart disease and the outcomes of the test results. Describes the treatment choices: Medical Management, Angioplasty, or Coronary Bypass Surgery  for treating various heart conditions including Myocardial Infarction, Angina, Valve Disease, and Cardiac Arrhythmias.  Written material given at graduation. Flowsheet Row Cardiac Rehab from 01/07/2021 in [facility] Cardiac and Pulmonary Rehab  Education need identified 11/03/20       Medication Safety: - Group verbal and visual instruction to review commonly prescribed medications for heart and lung disease. Reviews the medication, class of the drug, and side effects. Includes the steps to properly store meds and maintain the prescription regimen.  Written material given at graduation.   Intimacy: - Group verbal instruction through game format to discuss how heart and lung disease can affect sexual intimacy. Written material given at graduation.. Flowsheet Row Cardiac Rehab from 01/07/2021 in [facility] Cardiac and Pulmonary Rehab  Date 11/12/20  Educator [facility]  Instruction Review Code 1- Verbalizes Understanding       Know Your Numbers and Heart Failure: - Group verbal and visual instruction to discuss disease risk factors for cardiac and pulmonary disease and treatment options.  Reviews associated critical values for Overweight/Obesity, Hypertension, Cholesterol, and Diabetes.  Discusses basics of heart failure: signs/symptoms and treatments.  Introduces Heart Failure Zone chart for action plan for heart failure.  Written material given at graduation. Flowsheet Row Cardiac Rehab from 01/07/2021 in [facility] Cardiac and Pulmonary Rehab  Date 12/17/20  Educator [facility]  Instruction Review Code 1- Verbalizes Understanding       Infection Prevention: - Provides verbal and written material to individual with discussion of infection control including proper hand washing and proper equipment cleaning during exercise session. Flowsheet Row Cardiac Rehab from 01/07/2021 in [facility] Cardiac and Pulmonary Rehab  Date 11/03/20  Educator [facility]  Instruction Review Code 1- Verbalizes Understanding       Falls  Prevention: - Provides verbal and written material to individual with discussion of falls prevention and safety. Flowsheet Row Cardiac Rehab from 01/07/2021 in [facility] Cardiac and Pulmonary Rehab  Date 11/03/20  Educator [facility]  Instruction Review Code 1- Verbalizes Understanding       Other: -Provides group and verbal instruction on various topics (see comments)   Knowledge Questionnaire Score:  Knowledge Questionnaire Score - 11/03/20 1004       Knowledge Questionnaire Score   Pre Score 22/26             Core Components/Risk Factors/Patient Goals at Admission:  Personal Goals and Risk Factors at Admission - 11/03/20 1004       Core Components/Risk Factors/Patient Goals on Admission    Weight Management Yes;Weight Loss;Obesity    Intervention Weight Management: Develop a combined nutrition and exercise program designed to reach desired caloric intake, while maintaining appropriate intake of nutrient and fiber, sodium and fats, and appropriate energy expenditure required for the weight goal.;Weight Management: Provide education and appropriate resources to help participant work on and attain dietary goals.;Weight Management/Obesity: Establish reasonable short term and long term weight goals.;Obesity: Provide education and appropriate resources to help participant work on and attain dietary goals.    Admit Weight 216 lb 14.4 oz (98.4 kg)    Goal Weight: Short Term 210 lb (95.3 kg)    Goal Weight: Long Term 170 lb (77.1 kg)    Expected Outcomes Long Term: Adherence to nutrition and physical activity/exercise program aimed toward attainment of established  weight goal;Short Term: Continue to assess and modify interventions until short term weight is achieved;Understanding recommendations for meals to include 15-35% energy as protein, 25-35% energy from fat, 35-60% energy from carbohydrates, less than 2102m of dietary cholesterol, 20-35 gm of total fiber daily;Weight Loss: Understanding of  general recommendations for a balanced deficit meal plan, which promotes 1-2 lb weight loss per week and includes a negative energy balance of [phone] kcal/d;Understanding of distribution of calorie intake throughout the day with the consumption of 4-5 meals/snacks    Diabetes Yes    Intervention Provide education about signs/symptoms and action to take for hypo/hyperglycemia.;Provide education about proper nutrition, including hydration, and aerobic/resistive exercise prescription along with prescribed medications to achieve blood glucose in normal ranges: Fasting glucose 65-99 mg/dL    Expected Outcomes Short Term: Participant verbalizes understanding of the signs/symptoms and immediate care of hyper/hypoglycemia, proper foot care and importance of medication, aerobic/resistive exercise and nutrition plan for blood glucose control.;Long Term: Attainment of HbA1C < 7%.    Heart Failure Yes    Intervention Provide a combined exercise and nutrition program that is supplemented with education, support and counseling about heart failure. Directed toward relieving symptoms such as shortness of breath, decreased exercise tolerance, and extremity edema.    Expected Outcomes Improve functional capacity of life;Short term: Attendance in program 2-3 days a week with increased exercise capacity. Reported lower sodium intake. Reported increased fruit and vegetable intake. Reports medication compliance.;Short term: Daily weights obtained and reported for increase. Utilizing diuretic protocols set by physician.;Long term: Adoption of self-care skills and reduction of barriers for early signs and symptoms recognition and intervention leading to self-care maintenance.    Hypertension Yes    Intervention Provide education on lifestyle modifcations including regular physical activity/exercise, weight management, moderate sodium restriction and increased consumption of fresh fruit, vegetables, and low fat dairy, alcohol  moderation, and smoking cessation.;Monitor prescription use compliance.    Expected Outcomes Short Term: Continued assessment and intervention until BP is < 140/934mHG in hypertensive participants. < 130/802mG in hypertensive participants with diabetes, heart failure or chronic kidney disease.;Long Term: Maintenance of blood pressure at goal levels.    Lipids Yes    Intervention Provide education and support for participant on nutrition & aerobic/resistive exercise along with prescribed medications to achieve LDL <95m81mDL >40mg56m Expected Outcomes Short Term: Participant states understanding of desired cholesterol values and is compliant with medications prescribed. Participant is following exercise prescription and nutrition guidelines.;Long Term: Cholesterol controlled with medications as prescribed, with individualized exercise RX and with personalized nutrition plan. Value goals: LDL < 95mg,13m > 40 mg.             Education:Diabetes - Individual verbal and written instruction to review signs/symptoms of diabetes, desired ranges of glucose level fasting, after meals and with exercise. Acknowledge that pre and post exercise glucose checks will be done for 3 sessions at entry of program. [place]  Date 10/24/20  Educator [facility] Code 1- Verbalizes Understanding       Core Components/Risk Factors/Patient Goals Review:   Goals and Risk Factor Review     Row Name 12/22/20 0923 12/22/20 0924           Core Components/Risk Factors/Patient Goals Review   Personal Goals Review Weight Management/Obesity;Diabetes;Hypertension --      Review Joshua Ferrell does check BG and BP at home.  He says average fasting  is about 120-130. His weight is staying steady.  He can tell his stamina is better.  He can walk to the mailbox with no problem and move better in general.      Expected Outcomes -- Short:  continue to  work on weight loss and maintain heart healthy diet Long:  keep BG and BP in optimal range               Core Components/Risk Factors/Patient Goals at Discharge (Final Review):   Goals and Risk Factor Review - 12/22/20 0924       Core Components/Risk Factors/Patient Goals Review   Review His weight is staying steady.  He can tell his stamina is better.  He can walk to the mailbox with no problem and move better in general.    Expected Outcomes Short:  continue to work on weight loss and maintain heart healthy diet Long:  keep BG and BP in optimal range             ITP Comments:  ITP Comments     Row Name 10/24/20 1322 11/03/20 0948 11/05/20 0924 11/10/20 0930 11/19/20 0641   ITP Comments Initial telephone orientation completed. Diagnosis can be found in [facility] 9/29. EP orientation scheduled for Monday 10/17 at 8am. Completed 6MWT and gym orientation. Initial ITP created and sent for review to Dr. Emily Filbert, Medical Director. First full day of exercise!  Patient was oriented to gym and equipment including functions, settings, policies, and procedures.  Patient's individual exercise prescription and treatment plan were reviewed.  All starting workloads were established based on the results of the 6 minute walk test done at initial orientation visit.  The plan for exercise progression was also introduced and progression will be customized based on patient's performance and goals. Completed initial RD consultation 30 Day review completed. Medical Director ITP review done, changes made as directed, and signed approval by Medical Director.    [place] Name 12/17/20 [phone] 01/14/21 0658         ITP Comments 30 Day review completed. Medical Director ITP review done, changes made as directed, and signed approval by Medical Director. 30 Day review completed. Medical Director ITP review done, changes made as directed, and signed approval by Medical Director.               Comments:

## 2021-01-14 NOTE — Progress Notes (Signed)
Daily Session Note  Patient Details  Name: Joshua Ferrell. MRN: [id number] Date of Birth: [birth year] Referring Provider:   Flowsheet Row Cardiac Rehab from 11/03/2020 in [facility] Cardiac and Pulmonary Rehab  Referring Provider Isaias Cowman MD       Encounter Date: 01/14/2021  Check In:  Session Check In - 01/14/21 0933       Check-In   Supervising physician immediately available to respond to emergencies See telemetry face sheet for immediately available ER MD    Location [facility]-Cardiac & Pulmonary Rehab    Staff Present Heath Lark, RN, BSN, CCRP;Melissa Risco, RDN, LDN;Joseph [place], [place]    Virtual Visit No    Medication changes reported     No    Fall or balance concerns reported    No    Warm-up and Cool-down Performed on first and last piece of equipment    Resistance Training Performed Yes    VAD Patient? No    PAD/SET Patient? No      Pain Assessment   Currently in Pain? No/denies                Social History   Tobacco Use  Smoking Status Former   Packs/day: 0.00   Years: 0.00   Pack years: 0.00   Types: Cigarettes  Smokeless Tobacco Former   Types: Chew   Quit date: 1990    Goals Met:  Independence with exercise equipment Exercise tolerated well No report of concerns or symptoms today  Goals Unmet:  Not Applicable  Comments: Pt able to follow exercise prescription today without complaint.  Will continue to monitor for progression.    Dr. Emily Filbert is Medical Director for [facility].  Dr. Ottie Glazier is Medical Director for [facility] Pulmonary Rehabilitation.

## 2021-01-16 ENCOUNTER — Encounter: Payer: PPO | Admitting: *Deleted

## 2021-01-16 ENCOUNTER — Other Ambulatory Visit: Payer: Self-pay

## 2021-01-16 DIAGNOSIS — Z48812 Encounter for surgical aftercare following surgery on the circulatory system: Secondary | ICD-10-CM | POA: Diagnosis not present

## 2021-01-16 DIAGNOSIS — Z955 Presence of coronary angioplasty implant and graft: Secondary | ICD-10-CM

## 2021-01-16 DIAGNOSIS — I213 ST elevation (STEMI) myocardial infarction of unspecified site: Secondary | ICD-10-CM

## 2021-01-16 NOTE — Progress Notes (Signed)
Daily Session Note  Patient Details  Name: Joshua Ferrell. MRN: [id number] Date of Birth: 11-06-1945 Referring Provider:   Flowsheet Row Cardiac Rehab from 11/03/2020 in [facility] Cardiac and Pulmonary Rehab  Referring Provider Isaias Cowman MD       Encounter Date: 01/16/2021  Check In:  Session Check In - 01/16/21 0911       Check-In   Supervising physician immediately available to respond to emergencies See telemetry face sheet for immediately available ER MD    Location [facility]-Cardiac & Pulmonary Rehab    Staff Present Renita Papa, RN BSN;Joseph [place], RCP,RRT,BSRT;Jessica [place], [place], RCEP, CCRP, CCET    Virtual Visit No    Medication changes reported     No    Fall or balance concerns reported    No    Warm-up and Cool-down Performed on first and last piece of equipment    Resistance Training Performed Yes    VAD Patient? No    PAD/SET Patient? No      Pain Assessment   Currently in Pain? No/denies                Social History   Tobacco Use  Smoking Status Former   Packs/day: 0.00   Years: 0.00   Pack years: 0.00   Types: Cigarettes  Smokeless Tobacco Former   Types: Chew   Quit date: 1990    Goals Met:  Independence with exercise equipment Exercise tolerated well No report of concerns or symptoms today Strength training completed today  Goals Unmet:  Not Applicable  Comments: Pt able to follow exercise prescription today without complaint.  Will continue to monitor for progression.    Dr. Emily Filbert is Medical Director for [facility].  Dr. Ottie Glazier is Medical Director for [facility] Pulmonary Rehabilitation.

## 2021-01-21 ENCOUNTER — Other Ambulatory Visit: Payer: Self-pay

## 2021-01-21 ENCOUNTER — Encounter: Payer: PPO | Attending: Cardiology

## 2021-01-21 VITALS — Ht 68.2 in | Wt 214.3 lb

## 2021-01-21 DIAGNOSIS — Z5189 Encounter for other specified aftercare: Secondary | ICD-10-CM | POA: Insufficient documentation

## 2021-01-21 DIAGNOSIS — I213 ST elevation (STEMI) myocardial infarction of unspecified site: Secondary | ICD-10-CM

## 2021-01-21 DIAGNOSIS — I252 Old myocardial infarction: Secondary | ICD-10-CM | POA: Diagnosis not present

## 2021-01-21 DIAGNOSIS — Z955 Presence of coronary angioplasty implant and graft: Secondary | ICD-10-CM

## 2021-01-21 NOTE — Progress Notes (Signed)
Daily Session Note  Patient Details  Name: Joshua Ferrell. MRN: [id number] Date of Birth: 26-Apr-1945 Referring Provider:   Flowsheet Row Cardiac Rehab from 11/03/2020 in [facility] Cardiac and Pulmonary Rehab  Referring Provider Isaias Cowman MD       Encounter Date: 01/21/2021  Check In:  Session Check In - 01/21/21 0920       Check-In   Supervising physician immediately available to respond to emergencies See telemetry face sheet for immediately available ER MD    Location [facility]-Cardiac & Pulmonary Rehab    Staff Present Birdie Sons, MPA, Elveria Rising, BA, ACSM CEP, Exercise Physiologist;Joseph Tessie Fass, [place]    Virtual Visit No    Medication changes reported     No    Fall or balance concerns reported    No    Tobacco Cessation No Change    Warm-up and Cool-down Performed on first and last piece of equipment    Resistance Training Performed Yes    VAD Patient? No    PAD/SET Patient? No      Pain Assessment   Currently in Pain? No/denies                Social History   Tobacco Use  Smoking Status Former   Packs/day: 0.00   Years: 0.00   Pack years: 0.00   Types: Cigarettes  Smokeless Tobacco Former   Types: Chew   Quit date: 1990    Goals Met:  Independence with exercise equipment Exercise tolerated well No report of concerns or symptoms today Strength training completed today  Goals Unmet:  Not Applicable  Comments: Pt able to follow exercise prescription today without complaint.  Will continue to monitor for progression.    Dr. Emily Filbert is Medical Director for [place].  Dr. Ottie Glazier is Medical Director for [facility] Pulmonary Rehabilitation.

## 2021-01-22 NOTE — Patient Instructions (Signed)
Discharge Patient Instructions  Patient Details  Name: Joshua Ferrell. MRN: [id number] Date of Birth: April 26, 1945 Referring Provider:  Isaias Cowman, MD   Number of Visits: 53  Reason for Discharge:  Patient reached a stable level of exercise. Patient independent in their exercise. Patient has met program and personal goals.  Smoking History:  Social History   Tobacco Use  Smoking Status Former   Packs/day: 0.00   Years: 0.00   Pack years: 0.00   Types: Cigarettes  Smokeless Tobacco Former   Types: Chew   Quit date: 1990    Diagnosis:  ST elevation myocardial infarction (STEMI), unspecified artery ([place])  Status post coronary artery stent placement  Initial Exercise Prescription:  Initial Exercise Prescription - 11/03/20 0900       Date of Initial Exercise RX and Referring Provider   Date 11/03/20    Referring Provider Paraschos, Alexander MD      Oxygen   Maintain Oxygen Saturation 88% or higher      Treadmill   MPH 2    Grade 0.5    Minutes 15    METs 2.67      Recumbant Bike   Level 2    RPM 50    Watts 10    Minutes 15    METs 2      REL-XR   Level 2    Speed 50    Minutes 15    METs 2      Prescription Details   Frequency (times per week) 3    Duration Progress to 30 minutes of continuous aerobic without signs/symptoms of physical distress      Intensity   THRR 40-80% of Max Heartrate 94-127    Ratings of Perceived Exertion 11-13    Perceived Dyspnea 0-4      Progression   Progression Continue to progress workloads to maintain intensity without signs/symptoms of physical distress.      Resistance Training   Training Prescription Yes    Weight 4 lb    Reps 10-15             Discharge Exercise Prescription (Final Exercise Prescription Changes):  Exercise Prescription Changes - 01/14/21 1300       Response to Exercise   Blood Pressure (Admit) 104/60    Blood Pressure (Exit) 110/70    Heart Rate (Admit) 68 bpm     Heart Rate (Exercise) 94 bpm    Heart Rate (Exit) 76 bpm    Rating of Perceived Exertion (Exercise) 13    Symptoms none    Duration Continue with 30 min of aerobic exercise without signs/symptoms of physical distress.    Intensity THRR unchanged      Progression   Progression Continue to progress workloads to maintain intensity without signs/symptoms of physical distress.    Average METs 2.9      Resistance Training   Training Prescription Yes    Weight 5 lb    Reps 10-15      Interval Training   Interval Training No      Treadmill   MPH 2.5    Grade 1.5    Minutes 15    METs 3.43      Recumbant Bike   Level 3    Watts 20    Minutes 15    METs 2.33      Home Exercise Plan   Plans to continue exercise at Home (comment)   walking, join gym   Frequency Add  2 additional days to program exercise sessions.    Initial Home Exercises Provided 12/22/20             Functional Capacity:  6 Minute Walk     Row Name 11/03/20 0948 01/07/21 1232       6 Minute Walk   Phase Initial Discharge    Distance 1220 feet 1400 feet    Distance % Change -- 14.7 %    Distance Feet Change -- 180 ft    Walk Time 6 minutes 6 minutes    # of Rest Breaks 0 0    MPH 2.31 2.65    METS 2.24 2.77    RPE 11 13    Perceived Dyspnea  -- 1    VO2 Peak 7.85 9.7    Symptoms Yes (comment) No    Comments chronic hip pain 5/10 --    Resting HR 62 bpm 90 bpm    Resting BP 126/72 122/64    Resting Oxygen Saturation  97 % 98 %    Exercise Oxygen Saturation  during 6 min walk 96 % 96 %    Max Ex. HR 97 bpm 109 bpm    Max Ex. BP 158/74 164/78    2 Minute Post BP 134/66 --             Quality of Life:  Quality of Life - 01/21/21 1005       Quality of Life Scores   Health/Function Pre 26.4 %    Health/Function Post 15.03 %    Health/Function % Change -43.07 %    Socioeconomic Pre 30 %    Socioeconomic Post 18.25 %    Socioeconomic % Change  -39.17 %    Psych/Spiritual Pre 30 %     Psych/Spiritual Post 17.5 %    Psych/Spiritual % Change -41.67 %    Family Pre 30 %    Family Post 18 %    Family % Change -40 %    GLOBAL Pre 28.41 %    GLOBAL Post 16.69 %    GLOBAL % Change -41.25 %              Nutrition & Weight - Outcomes:  Pre Biometrics - 11/03/20 1002       Pre Biometrics   Height 5' 8.2" (1.732 m)    Weight 216 lb 14.4 oz (98.4 kg)    BMI (Calculated) 32.8    Single Leg Stand 2.5 seconds             Nutrition:  Nutrition Therapy & Goals - 11/10/20 0845       Nutrition Therapy   Drug/Food Interactions Food/Disease    Protein (specify units) 75g    Fiber 30 grams    Whole Grain Foods 3 servings    Saturated Fats 12 max. grams    Fruits and Vegetables 8 servings/day    Sodium 1.5 grams      Personal Nutrition Goals   Nutrition Goal ST: 2-4 CHO servings at meals (make sure to have at least 2 CHO servings for breakfast to avoid BG lows during exercise) LT: Include at least 5 fruit/vegetable servings per day, limit processed meat to <3x/week    Comments On investigational study medication: semaglutide 14 mg or placebo, Historical Med. Also on Metformin. A1C 5.8 per patient last month. B: may or may not cook - bacon or sausage with eggs and sometimes toast (whole wheat bread). Ham and egg on bun 1x/week. Cereal  once in a while. Always coffee (black) S: cookie with maybe a diet coke L: Ham or [place] sandwich. Cooks chicken wings or leftovers. D: grills chicken, hamburger eveyr once in a while, fish. Green beans, sugar peas, corn, cauliflower, cabbage, collards, "whatever strikes me". Drinks: 1-3 beers before dinner and water. Discussed heart healthy eating and focused on diabetes friendly eating due to time constraints. Patient will need review/follow up education on general heart healhty eating and meal planning.      Intervention Plan   Intervention Prescribe, educate and counsel regarding individualized specific dietary modifications aiming  towards targeted core components such as weight, hypertension, lipid management, diabetes, heart failure and other comorbidities.    Expected Outcomes Short Term Goal: Understand basic principles of dietary content, such as calories, fat, sodium, cholesterol and nutrients.;Short Term Goal: A plan has been developed with personal nutrition goals set during dietitian appointment.;Long Term Goal: Adherence to prescribed nutrition plan.             Goals reviewed with patient; copy given to patient.

## 2021-01-23 ENCOUNTER — Other Ambulatory Visit: Payer: Self-pay

## 2021-01-23 ENCOUNTER — Encounter: Payer: PPO | Admitting: *Deleted

## 2021-01-23 DIAGNOSIS — I213 ST elevation (STEMI) myocardial infarction of unspecified site: Secondary | ICD-10-CM

## 2021-01-23 DIAGNOSIS — Z5189 Encounter for other specified aftercare: Secondary | ICD-10-CM | POA: Diagnosis not present

## 2021-01-23 DIAGNOSIS — Z955 Presence of coronary angioplasty implant and graft: Secondary | ICD-10-CM

## 2021-01-23 NOTE — Progress Notes (Signed)
Cardiac Individual Treatment Plan  Patient Details  Name: Joshua Ferrell. MRN: [id number] Date of Birth: 04-28-1945 Referring Provider:   Flowsheet Row Cardiac Rehab from 11/03/2020 in [facility] Cardiac and Pulmonary Rehab  Referring Provider Isaias Cowman MD       Initial Encounter Date:  Flowsheet Row Cardiac Rehab from 11/03/2020 in [facility] Cardiac and Pulmonary Rehab  Date 11/03/20       Visit Diagnosis: ST elevation myocardial infarction (STEMI), unspecified artery [facility])  Status post coronary artery stent placement  Patient's Home Medications on Admission:  Current Outpatient Medications:    acetaminophen (TYLENOL) 650 MG CR tablet, Take by mouth. (Patient not taking: Reported on 10/24/2020), Disp: , Rfl:    amLODipine (NORVASC) 5 MG tablet, Take 5 mg by mouth daily., Disp: , Rfl:    aspirin EC 81 MG tablet, Take 81 mg by mouth daily., Disp: , Rfl:    clopidogrel (PLAVIX) 75 MG tablet, Take 1 tablet (75 mg total) by mouth daily with breakfast., Disp: 30 tablet, Rfl: 1   ezetimibe (ZETIA) 10 MG tablet, Take 1 tablet (10 mg total) by mouth daily., Disp: 30 tablet, Rfl: 1   glimepiride (AMARYL) 2 MG tablet, Take 2 mg by mouth daily with breakfast., Disp: , Rfl:    Investigational - Study Medication, Take 14 mg by mouth daily before breakfast. Study name: semaglutide 14 mg or placebo, Disp: , Rfl:    metFORMIN (GLUCOPHAGE) 500 MG tablet, Take 1,000 mg by mouth 2 (two) times daily with a meal., Disp: , Rfl:    metoprolol succinate (TOPROL-XL) 50 MG 24 hr tablet, Take 1 tablet (50 mg total) by mouth daily. Take with or immediately following a meal., Disp: 30 tablet, Rfl: 1   nitroGLYCERIN (NITROSTAT) 0.4 MG SL tablet, Place 0.4 mg under the tongue every 5 (five) minutes as needed for chest pain., Disp: , Rfl:    valsartan-hydrochlorothiazide (DIOVAN-HCT) 160-12.5 MG tablet, Take 1 tablet by mouth daily., Disp: , Rfl:   Past Medical History: Past Medical History:  Diagnosis  Date   Arthritis    Coronary artery disease    COVID-19 12/2018   Diabetes mellitus without complication (HCC)    Dyspnea    with exertion   GERD (gastroesophageal reflux disease)    minimal episodes   Headache(784.0)    Heart disease    History of blood transfusion 1990   internal injuries after a fall requiring blood   History of colonic polyps    Hyperglycemia    Hyperlipidemia    Hypertension    Myocardial infarction ([place]) 2003   PONV (postoperative nausea and vomiting)    one time only (surgery in 1990)    Tobacco Use: Social History   Tobacco Use  Smoking Status Former   Packs/day: 0.00   Years: 0.00   Pack years: 0.00   Types: Cigarettes  Smokeless Tobacco Former   Types: Chew   Quit date: 1990    Labs: 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   Exercise Prescription Goal: Initial exercise prescription builds to 30-45 minutes a day of aerobic activity, 2-3 days per week.  Home exercise guidelines will be  given to patient during program as part of exercise prescription that the participant will acknowledge.   Education: Aerobic Exercise: - Group verbal and visual presentation on the components of exercise prescription. Introduces F.I.T.T principle from ACSM for exercise prescriptions.  Reviews F.I.T.T. principles of aerobic exercise including progression. Written material given at graduation. Flowsheet Row Cardiac Rehab from 01/21/2021 in [facility] Cardiac and Pulmonary Rehab  Education need identified 11/03/20  Date 11/12/20  Educator [facility]  Instruction Review Code 1- Verbalizes Understanding       Education: Resistance Exercise: - Group verbal and visual  presentation on the components of exercise prescription. Introduces F.I.T.T principle from ACSM for exercise prescriptions  Reviews F.I.T.T. principles of resistance exercise including progression. Written material given at graduation. Flowsheet Row Cardiac Rehab from 01/21/2021 in [facility] Cardiac and Pulmonary Rehab  Date 01/21/21  Educator [facility]  Instruction Review Code 1- Verbalizes Understanding        Education: Exercise & Equipment Safety: - Individual verbal instruction and demonstration of equipment use and safety with use of the equipment. Flowsheet Row Cardiac Rehab from 01/21/2021 in [facility] Cardiac and Pulmonary Rehab  Date 11/03/20  Educator [facility]  Instruction Review Code 1- Verbalizes Understanding       Education: Exercise Physiology & General Exercise Guidelines: - Group verbal and written instruction with models to review the exercise physiology of the cardiovascular system and associated critical values. Provides general exercise guidelines with specific guidelines to those with heart or lung disease.  Flowsheet Row Cardiac Rehab from 01/21/2021 in [facility] Cardiac and Pulmonary Rehab  Date 01/07/21  Educator [facility]  Instruction Review Code 1- Verbalizes Understanding       Education: Flexibility, Balance, Mind/Body Relaxation: - Group verbal and visual presentation with interactive activity on the components of exercise prescription. Introduces F.I.T.T principle from ACSM for exercise prescriptions. Reviews F.I.T.T. principles of flexibility and balance exercise training including progression. Also discusses the mind body connection.  Reviews various relaxation techniques to help reduce and manage stress (i.e. Deep breathing, progressive muscle relaxation, and visualization). Balance handout provided to take home. Written material given at graduation. Flowsheet Row Cardiac Rehab from 01/21/2021 in [facility] Cardiac and Pulmonary Rehab  Date 11/26/20  Educator [facility]  Instruction Review Code 1-  Verbalizes Understanding       Activity Barriers & Risk Stratification:  Activity Barriers & Cardiac Risk Stratification - 10/24/20 1304       Activity Barriers & Cardiac Risk Stratification   Activity Barriers Arthritis;Joint Problems    Cardiac Risk Stratification High             6 Minute Walk:  6 Minute Walk     Row Name 11/03/20 0948 01/07/21 1232       6 Minute Walk   Phase Initial Discharge    Distance 1220 feet 1400 feet    Distance % Change -- 14.7 %    Distance Feet Change -- 180 ft    Walk Time 6 minutes 6 minutes    # of Rest Breaks 0 0    MPH 2.31 2.65    METS 2.24 2.77    RPE 11 13    Perceived Dyspnea  -- 1    VO2 Peak 7.85 9.7    Symptoms Yes (comment) No    Comments chronic hip pain 5/10 --    Resting HR 62 bpm 90 bpm    Resting BP 126/72 122/64    Resting Oxygen Saturation  97 % 98 %    Exercise  Oxygen Saturation  during 6 min walk 96 % 96 %    Max Ex. HR 97 bpm 109 bpm    Max Ex. BP 158/74 164/78    2 Minute Post BP 134/66 --             Oxygen Initial Assessment:   Oxygen Re-Evaluation:   Oxygen Discharge (Final Oxygen Re-Evaluation):   Initial Exercise Prescription:  Initial Exercise Prescription - 11/03/20 0900       Date of Initial Exercise RX and Referring Provider   Date 11/03/20    Referring Provider Paraschos, Alexander MD      Oxygen   Maintain Oxygen Saturation 88% or higher      Treadmill   MPH 2    Grade 0.5    Minutes 15    METs 2.67      Recumbant Bike   Level 2    RPM 50    Watts 10    Minutes 15    METs 2      REL-XR   Level 2    Speed 50    Minutes 15    METs 2      Prescription Details   Frequency (times per week) 3    Duration Progress to 30 minutes of continuous aerobic without signs/symptoms of physical distress      Intensity   THRR 40-80% of Max Heartrate 94-127    Ratings of Perceived Exertion 11-13    Perceived Dyspnea 0-4      Progression   Progression Continue to  progress workloads to maintain intensity without signs/symptoms of physical distress.      Resistance Training   Training Prescription Yes    Weight 4 lb    Reps 10-15             Perform Capillary Blood Glucose checks as needed.  Exercise Prescription Changes:   Exercise Prescription Changes     Row Name 11/03/20 0900 11/17/20 1000 12/02/20 1400 12/16/20 0800 12/29/20 0900     Response to Exercise   Blood Pressure (Admit) 126/72 122/64 132/70 110/62 126/78   Blood Pressure (Exercise) 158/74 142/74 150/92 -- --   Blood Pressure (Exit) 134/66 120/64 126/58 110/60 124/68   Heart Rate (Admit) 62 bpm 70 bpm 65 bpm 64 bpm 69 bpm   Heart Rate (Exercise) 97 bpm 95 bpm 113 bpm 98 bpm 100 bpm   Heart Rate (Exit) 60 bpm 69 bpm 73 bpm 87 bpm 77 bpm   Oxygen Saturation (Admit) 97 % -- -- -- --   Oxygen Saturation (Exercise) 96 % -- -- -- --   Rating of Perceived Exertion (Exercise) 11 12 13 12 13    Symptoms chronic hip pain 5/10 none none none none   Comments walk test results 2nd full week of exercise -- -- --   Duration -- Progress to 30 minutes of  aerobic without signs/symptoms of physical distress Progress to 30 minutes of  aerobic without signs/symptoms of physical distress Continue with 30 min of aerobic exercise without signs/symptoms of physical distress. Continue with 30 min of aerobic exercise without signs/symptoms of physical distress.   Intensity -- THRR unchanged THRR unchanged THRR unchanged THRR unchanged     Progression   Progression -- Continue to progress workloads to maintain intensity without signs/symptoms of physical distress. Continue to progress workloads to maintain intensity without signs/symptoms of physical distress. Continue to progress workloads to maintain intensity without signs/symptoms of physical distress. Continue to progress workloads to maintain  intensity without signs/symptoms of physical distress.   Average METs -- 3.11 3.35 3.36 3.35      Resistance Training   Training Prescription -- Yes Yes Yes Yes   Weight -- 4 lb 5 lb 5 lb 5 lb   Reps -- 10-15 10-15 10-15 10-15     Interval Training   Interval Training -- No No No No     Treadmill   MPH -- 2.5 2.$Remove'6 3 3   'kYsDIjw$ Grade -- 0.5 1 1.5 1.5   Minutes -- $RemoveBe'15 15 15 15   'RoyErPUAI$ METs -- 3.09 3.35 3.92 3.92     Recumbant Bike   Level -- 2 -- 3 3   Watts -- -- -- 25 26   Minutes -- 15 -- 15 15   METs -- 2.31 -- 2.92 2.92     REL-XR   Level -- $Remove'1 4 4 4   'qzimjsx$ Minutes -- $RemoveBe'15 15 15 15   'dTwSjMYls$ METs -- 3.2 -- -- --     Home Exercise Plan   Plans to continue exercise at -- -- -- -- Home (comment)  walking, join gym   Frequency -- -- -- -- Add 2 additional days to program exercise sessions.   Initial Home Exercises Provided -- -- -- -- 12/22/20    Row Name 01/14/21 1300             Response to Exercise   Blood Pressure (Admit) 104/60       Blood Pressure (Exit) 110/70       Heart Rate (Admit) 68 bpm       Heart Rate (Exercise) 94 bpm       Heart Rate (Exit) 76 bpm       Rating of Perceived Exertion (Exercise) 13       Symptoms none       Duration Continue with 30 min of aerobic exercise without signs/symptoms of physical distress.       Intensity THRR unchanged         Progression   Progression Continue to progress workloads to maintain intensity without signs/symptoms of physical distress.       Average METs 2.9         Resistance Training   Training Prescription Yes       Weight 5 lb       Reps 10-15         Interval Training   Interval Training No         Treadmill   MPH 2.5       Grade 1.5       Minutes 15       METs 3.43         Recumbant Bike   Level 3       Watts 20       Minutes 15       METs 2.33         Home Exercise Plan   Plans to continue exercise at Home (comment)  walking, join gym       Frequency Add 2 additional days to program exercise sessions.       Initial Home Exercises Provided 12/22/20                Exercise Comments:   Exercise Comments      Row Name 11/05/20 8676           Exercise Comments First full day of exercise!  Patient was oriented to gym and equipment including  functions, settings, policies, and procedures.  Patient's individual exercise prescription and treatment plan were reviewed.  All starting workloads were established based on the results of the 6 minute walk test done at initial orientation visit.  The plan for exercise progression was also introduced and progression will be customized based on patient's performance and goals.                Exercise Goals and Review:   Exercise Goals     Row Name 11/03/20 1002             Exercise Goals   Increase Physical Activity Yes       Intervention Provide advice, education, support and counseling about physical activity/exercise needs.;Develop an individualized exercise prescription for aerobic and resistive training based on initial evaluation findings, risk stratification, comorbidities and participant's personal goals.       Expected Outcomes Short Term: Attend rehab on a regular basis to increase amount of physical activity.;Long Term: Add in home exercise to make exercise part of routine and to increase amount of physical activity.;Long Term: Exercising regularly at least 3-5 days a week.       Increase Strength and Stamina Yes       Intervention Provide advice, education, support and counseling about physical activity/exercise needs.;Develop an individualized exercise prescription for aerobic and resistive training based on initial evaluation findings, risk stratification, comorbidities and participant's personal goals.       Expected Outcomes Short Term: Increase workloads from initial exercise prescription for resistance, speed, and METs.;Short Term: Perform resistance training exercises routinely during rehab and add in resistance training at home;Long Term: Improve cardiorespiratory fitness, muscular endurance and strength as measured by increased  METs and functional capacity (6MWT)       Able to understand and use rate of perceived exertion (RPE) scale Yes       Intervention Provide education and explanation on how to use RPE scale       Expected Outcomes Short Term: Able to use RPE daily in rehab to express subjective intensity level;Long Term:  Able to use RPE to guide intensity level when exercising independently       Able to understand and use Dyspnea scale Yes       Intervention Provide education and explanation on how to use Dyspnea scale       Expected Outcomes Short Term: Able to use Dyspnea scale daily in rehab to express subjective sense of shortness of breath during exertion;Long Term: Able to use Dyspnea scale to guide intensity level when exercising independently       Knowledge and understanding of Target Heart Rate Range (THRR) Yes       Intervention Provide education and explanation of THRR including how the numbers were predicted and where they are located for reference       Expected Outcomes Short Term: Able to state/look up THRR;Short Term: Able to use daily as guideline for intensity in rehab;Long Term: Able to use THRR to govern intensity when exercising independently       Able to check pulse independently Yes       Intervention Provide education and demonstration on how to check pulse in carotid and radial arteries.;Review the importance of being able to check your own pulse for safety during independent exercise       Expected Outcomes Short Term: Able to explain why pulse checking is important during independent exercise;Long Term: Able to check pulse independently and accurately  Understanding of Exercise Prescription Yes       Intervention Provide education, explanation, and written materials on patient's individual exercise prescription       Expected Outcomes Long Term: Able to explain home exercise prescription to exercise independently;Short Term: Able to explain program exercise prescription                 Exercise Goals Re-Evaluation :  Exercise Goals Re-Evaluation     Row Name 11/05/20 0925 11/17/20 1017 12/02/20 1441 12/16/20 0834 12/22/20 0930     Exercise Goal Re-Evaluation   Exercise Goals Review Increase Physical Activity;Understanding of Exercise Prescription;Knowledge and understanding of Target Heart Rate Range (THRR);Able to understand and use rate of perceived exertion (RPE) scale;Increase Strength and Stamina;Able to understand and use Dyspnea scale;Able to check pulse independently Increase Physical Activity;Increase Strength and Stamina Increase Physical Activity;Increase Strength and Stamina Increase Physical Activity;Increase Strength and Stamina;Understanding of Exercise Prescription Increase Physical Activity;Increase Strength and Stamina   Comments Reviewed RPE and dyspnea scales, THR and program prescription with pt today.  Pt voiced understanding and was given a copy of goals to take home. Jakobee is doing well for the first couple of weeks that he has been here. He is tolerating his exercise prescription well and is hitting his [place] so far. Will continue to monitor as he progresses through the program. Rush Landmark continues to do well and has increased TM to 2.6 mph and 1% incline.  He uses 5 lb for strength training. We will monitor progress. Rush Landmark is doing well in rehab.  He is up to 3 mph on the treadmill.  He has not been turning in his METs from the XR and we will work with him on this.  We will continue to monitor his progress. Rush Landmark has visited [facility] or [place] to maintain exercise after completing HT.   Expected Outcomes Short: Use RPE daily to regulate intensity. Long: Follow program prescription in THR. Short: Continue current exercise prescription and start to increase loads as tolerated Long: Increase overall MET level Short:  attend consistently and reach THR range Long:  build overall stamina Short: Turn in METs from XR Long: Continue to improve  stamina. --    Row Name 12/22/20 0944 12/29/20 0932 01/14/21 1304         Exercise Goal Re-Evaluation   Exercise Goals Review -- Increase Physical Activity;Increase Strength and Stamina;Understanding of Exercise Prescription Increase Physical Activity;Increase Strength and Stamina     Comments Reviewed home exercise with pt today.  Pt plans to join a fitness center for exercise.  Reviewed THR, pulse, RPE, sign and symptoms, pulse oximetery and when to call 911 or MD.  Also discussed weather considerations and indoor options.  Pt voiced understanding. Rush Landmark is doing well in rehab.  He is up to 3.0 mph on the treadmill and on level 3 for the bike.  We will continue to monitor his progress. Rush Landmark is close to completing the program.  He improved post walk by 180 feet!     Expected Outcomes Short:  add in 2-3 days of exercise outside program sessions Long:  maintain exercise on his own Short: Report METs from XR Long: continue to improve stamina Short: complete [facility] Long: maintain exercise on his own              Discharge Exercise Prescription (Final Exercise Prescription Changes):  Exercise Prescription Changes - 01/14/21 1300       Response to Exercise   Blood  Pressure (Admit) 104/60    Blood Pressure (Exit) 110/70    Heart Rate (Admit) 68 bpm    Heart Rate (Exercise) 94 bpm    Heart Rate (Exit) 76 bpm    Rating of Perceived Exertion (Exercise) 13    Symptoms none    Duration Continue with 30 min of aerobic exercise without signs/symptoms of physical distress.    Intensity THRR unchanged      Progression   Progression Continue to progress workloads to maintain intensity without signs/symptoms of physical distress.    Average METs 2.9      Resistance Training   Training Prescription Yes    Weight 5 lb    Reps 10-15      Interval Training   Interval Training No      Treadmill   MPH 2.5    Grade 1.5    Minutes 15    METs 3.43      Recumbant Bike   Level 3    Watts  20    Minutes 15    METs 2.33      Home Exercise Plan   Plans to continue exercise at Home (comment)   walking, join gym   Frequency Add 2 additional days to program exercise sessions.    Initial Home Exercises Provided 12/22/20             Nutrition:  Target Goals: Understanding of nutrition guidelines, daily intake of sodium '1500mg'$ , cholesterol '200mg'$ , calories 30% from fat and 7% or less from saturated fats, daily to have 5 or more servings of fruits and vegetables.  Education: All About Nutrition: -Group instruction provided by verbal, written material, interactive activities, discussions, models, and posters to present general guidelines for heart healthy nutrition including fat, fiber, MyPlate, the role of sodium in heart healthy nutrition, utilization of the nutrition label, and utilization of this knowledge for meal planning. Follow up email sent as well. Written material given at graduation. Flowsheet Row Cardiac Rehab from 01/21/2021 in [facility] Cardiac and Pulmonary Rehab  Education need identified 11/03/20  Date 12/03/20  Educator [place]  Instruction Review Code 1- Verbalizes Understanding       Biometrics:  Pre Biometrics - 11/03/20 1002       Pre Biometrics   Height 5' 8.2" (1.732 m)    Weight 216 lb 14.4 oz (98.4 kg)    BMI (Calculated) 32.8    Single Leg Stand 2.5 seconds             Post Biometrics - 01/22/21 1516        Post  Biometrics   Height 5' 8.2" (1.732 m)    Weight 214 lb 4.8 oz (97.2 kg)    BMI (Calculated) 32.4    Single Leg Stand 15.75 seconds             Nutrition Therapy Plan and Nutrition Goals:  Nutrition Therapy & Goals - 11/10/20 0845       Nutrition Therapy   Drug/Food Interactions Food/Disease    Protein (specify units) 75g    Fiber 30 grams    Whole Grain Foods 3 servings    Saturated Fats 12 max. grams    Fruits and Vegetables 8 servings/day    Sodium 1.5 grams      Personal Nutrition Goals   Nutrition Goal ST: 2-4  CHO servings at meals (make sure to have at least 2 CHO servings for breakfast to avoid BG lows during exercise) LT: Include at least 5 fruit/vegetable  servings per day, limit processed meat to <3x/week    Comments On investigational study medication: semaglutide 14 mg or placebo, Historical Med. Also on Metformin. A1C 5.8 per patient last month. B: may or may not cook - bacon or sausage with eggs and sometimes toast (whole wheat bread). Ham and egg on bun 1x/week. Cereal once in a while. Always coffee (black) S: cookie with maybe a diet coke L: Ham or [place] sandwich. Cooks chicken wings or leftovers. D: grills chicken, hamburger eveyr once in a while, fish. Green beans, sugar peas, corn, cauliflower, cabbage, collards, "whatever strikes me". Drinks: 1-3 beers before dinner and water. Discussed heart healthy eating and focused on diabetes friendly eating due to time constraints. Patient will need review/follow up education on general heart healhty eating and meal planning.      Intervention Plan   Intervention Prescribe, educate and counsel regarding individualized specific dietary modifications aiming towards targeted core components such as weight, hypertension, lipid management, diabetes, heart failure and other comorbidities.    Expected Outcomes Short Term Goal: Understand basic principles of dietary content, such as calories, fat, sodium, cholesterol and nutrients.;Short Term Goal: A plan has been developed with personal nutrition goals set during dietitian appointment.;Long Term Goal: Adherence to prescribed nutrition plan.             Nutrition Assessments:  MEDIFICTS Score Key: ?70 Need to make dietary changes  40-70 Heart Healthy Diet ? 40 Therapeutic Level Cholesterol Diet  Flowsheet Row Cardiac Rehab from 01/14/2021 in [facility] Cardiac and Pulmonary Rehab  Picture Your Plate Total Score on Admission 72  Picture Your Plate Total Score on Discharge 52      Picture Your Plate  Scores: <32 Unhealthy dietary pattern with much room for improvement. 41-50 Dietary pattern unlikely to meet recommendations for good health and room for improvement. 51-60 More healthful dietary pattern, with some room for improvement.  >60 Healthy dietary pattern, although there may be some specific behaviors that could be improved.    Nutrition Goals Re-Evaluation:  Nutrition Goals Re-Evaluation     [place] Name 12/22/20 0927             Goals   Comment Rush Landmark is still working on improving diet.  He tries to eat small portions.       Expected Outcome Short: continue to add in fruits and vegetables Long:  maintain heart healthy diet                Nutrition Goals Discharge (Final Nutrition Goals Re-Evaluation):  Nutrition Goals Re-Evaluation - 12/22/20 0927       Goals   Comment Rush Landmark is still working on improving diet.  He tries to eat small portions.    Expected Outcome Short: continue to add in fruits and vegetables Long:  maintain heart healthy diet             Psychosocial: Target Goals: Acknowledge presence or absence of significant depression and/or stress, maximize coping skills, provide positive support system. Participant is able to verbalize types and ability to use techniques and skills needed for reducing stress and depression.   Education: Stress, Anxiety, and Depression - Group verbal and visual presentation to define topics covered.  Reviews how body is impacted by stress, anxiety, and depression.  Also discusses healthy ways to reduce stress and to treat/manage anxiety and depression.  Written material given at graduation. Flowsheet Row Cardiac Rehab from 01/21/2021 in [facility] Cardiac and Pulmonary Rehab  Education need identified 11/03/20  Education: Sleep Hygiene -Provides group verbal and written instruction about how sleep can affect your health.  Define sleep hygiene, discuss sleep cycles and impact of sleep habits. Review good sleep hygiene tips.     Initial Review & Psychosocial Screening:  Initial Psych Review & Screening - 10/24/20 1307       Initial Review   Current issues with None Identified      Family Dynamics   Good Support System? Yes   friends     Barriers   Psychosocial barriers to participate in program There are no identifiable barriers or psychosocial needs.;The patient should benefit from training in stress management and relaxation.      Screening Interventions   Interventions Encouraged to exercise;Provide feedback about the scores to participant;To provide support and resources with identified psychosocial needs    Expected Outcomes Short Term goal: Utilizing psychosocial counselor, staff and physician to assist with identification of specific Stressors or current issues interfering with healing process. Setting desired goal for each stressor or current issue identified.;Long Term Goal: Stressors or current issues are controlled or eliminated.;Short Term goal: Identification and review with participant of any Quality of Life or Depression concerns found by scoring the questionnaire.;Long Term goal: The participant improves quality of Life and PHQ9 Scores as seen by post scores and/or verbalization of changes             Quality of Life Scores:   Quality of Life - 01/21/21 1005       Quality of Life Scores   Health/Function Pre 26.4 %    Health/Function Post 15.03 %    Health/Function % Change -43.07 %    Socioeconomic Pre 30 %    Socioeconomic Post 18.25 %    Socioeconomic % Change  -39.17 %    Psych/Spiritual Pre 30 %    Psych/Spiritual Post 17.5 %    Psych/Spiritual % Change -41.67 %    Family Pre 30 %    Family Post 18 %    Family % Change -40 %    GLOBAL Pre 28.41 %    GLOBAL Post 16.69 %    GLOBAL % Change -41.25 %            Scores of 19 and below usually indicate a poorer quality of life in these areas.  A difference of  2-3 points is a clinically meaningful difference.  A  difference of 2-3 points in the total score of the Quality of Life Index has been associated with significant improvement in overall quality of life, self-image, physical symptoms, and general health in studies assessing change in quality of life.  PHQ-9: Recent Review Flowsheet Data     Depression screen [facility] 2/9 01/14/2021 11/03/2020   Decreased Interest 0 0   Down, Depressed, Hopeless 0 0   PHQ - 2 Score 0 0   Altered sleeping 0 0   Tired, decreased energy 0 0   Change in appetite 0 0   Feeling bad or failure about yourself  0 0   Trouble concentrating 0 0   Moving slowly or fidgety/restless 0 0   Suicidal thoughts 0 0   PHQ-9 Score 0 0   Difficult doing work/chores Not difficult at all Not difficult at all      Interpretation of Total Score  Total Score Depression Severity:  1-4 = Minimal depression, 5-9 = Mild depression, 10-14 = Moderate depression, 15-19 = Moderately severe depression, 20-27 = Severe depression   Psychosocial Evaluation and  Intervention:  Psychosocial Evaluation - 10/24/20 1319       Psychosocial Evaluation & Interventions   Interventions Encouraged to exercise with the program and follow exercise prescription    Comments Mr. Yabut reports doing well post MI in May of this year. He is back to working outside which keeps him busy most days. He does not report any stress concerns. He has a lot of friends that make sure he is doing well, four that stop by daily to socialize. 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 He is proud of himself and is motivated to keep working to feel better. He wants to lose some weight and maintain his stamina and independence    Expected Outcomes Short: attend cardiac rehab for education and exercise. Long: develop and maintain positive self care habits.    Continue Psychosocial Services  Follow up required by staff              Psychosocial Re-Evaluation:  Psychosocial Re-Evaluation     [place] Name 12/22/20 0929             Psychosocial Re-Evaluation   Comments Rush Landmark reports no symptoms of stress, anxiety or depression.  He says he sleeps well.       Expected Outcomes Short: notify staff if any changes in mental health status Long:  maintain positive outlook                Psychosocial Discharge (Final Psychosocial Re-Evaluation):  Psychosocial Re-Evaluation - 12/22/20 0929       Psychosocial Re-Evaluation   Comments Bill reports no symptoms of stress, anxiety or depression.  He says he sleeps well.    Expected Outcomes Short: notify staff if any changes in mental health status Long:  maintain positive outlook             Vocational Rehabilitation: Provide vocational rehab assistance to qualifying candidates.   Vocational Rehab Evaluation & Intervention:  Vocational Rehab - 01/14/21 0939       Initial Vocational Rehab Evaluation & Intervention   Assessment shows need for Vocational Rehabilitation No      Discharge Vocational Rehab   Discharge Vocational Rehabilitation retired             Education: Education Goals: Education classes will be provided on a variety of topics geared toward better understanding of heart health and risk factor modification. Participant will state understanding/return demonstration of topics presented as noted by education test scores.  Learning Barriers/Preferences:  Learning Barriers/Preferences - 10/24/20 1318       Learning Barriers/Preferences   Learning Barriers None    Learning Preferences None             General Cardiac Education Topics:  AED/CPR: - Group verbal and written instruction with the use of models to demonstrate the basic use of the AED with the basic ABC's of resuscitation.   Anatomy and Cardiac Procedures: - Group verbal and visual presentation and models provide information about basic cardiac anatomy and  function. Reviews the testing methods done to diagnose heart disease and the outcomes of the test results. Describes the treatment choices: Medical Management, Angioplasty, or Coronary Bypass Surgery for treating various heart conditions including Myocardial Infarction, Angina, Valve Disease, and Cardiac Arrhythmias.  Written material given at graduation. Flowsheet Row Cardiac Rehab from 01/21/2021 in [facility] Cardiac and Pulmonary Rehab  Education need identified 11/03/20  Date 01/21/21  Educator SB  Instruction Review Code 1- Verbalizes Understanding       Medication Safety: - Group verbal and visual instruction to review commonly prescribed medications for heart and lung disease. Reviews the medication, class of the drug, and side effects. Includes the steps to properly store meds and maintain the prescription regimen.  Written material given at graduation.   Intimacy: - Group verbal instruction through game format to discuss how heart and lung disease can affect sexual intimacy. Written material given at graduation.. Flowsheet Row Cardiac Rehab from 01/21/2021 in [facility] Cardiac and Pulmonary Rehab  Date 11/12/20  Educator [facility]  Instruction Review Code 1- Verbalizes Understanding       Know Your Numbers and Heart Failure: - Group verbal and visual instruction to discuss disease risk factors for cardiac and pulmonary disease and treatment options.  Reviews associated critical values for Overweight/Obesity, Hypertension, Cholesterol, and Diabetes.  Discusses basics of heart failure: signs/symptoms and treatments.  Introduces Heart Failure Zone chart for action plan for heart failure.  Written material given at graduation. Flowsheet Row Cardiac Rehab from 01/21/2021 in [facility] Cardiac and Pulmonary Rehab  Date 12/17/20  Educator SB  Instruction Review Code 1- Verbalizes Understanding       Infection Prevention: - Provides verbal and written material to individual with discussion of infection  control including proper hand washing and proper equipment cleaning during exercise session. Flowsheet Row Cardiac Rehab from 01/21/2021 in [facility] Cardiac and Pulmonary Rehab  Date 11/03/20  Educator [facility]  Instruction Review Code 1- Verbalizes Understanding       Falls Prevention: - Provides verbal and written material to individual with discussion of falls prevention and safety. Flowsheet Row Cardiac Rehab from 01/21/2021 in [facility] Cardiac and Pulmonary Rehab  Date 11/03/20  Educator [facility]  Instruction Review Code 1- Verbalizes Understanding       Other: -Provides group and verbal instruction on various topics (see comments)   Knowledge Questionnaire Score:  Knowledge Questionnaire Score - 01/21/21 1005       Knowledge Questionnaire Score   Pre Score 22/26    Post Score 26/26             Core Components/Risk Factors/Patient Goals at Admission:  Personal Goals and Risk Factors at Admission - 11/03/20 1004       Core Components/Risk Factors/Patient Goals on Admission    Weight Management Yes;Weight Loss;Obesity    Intervention Weight Management: Develop a combined nutrition and exercise program designed to reach desired caloric intake, while maintaining appropriate intake of nutrient and fiber, sodium and fats, and appropriate energy expenditure required for the weight goal.;Weight Management: Provide education and appropriate resources to help participant work on and attain dietary goals.;Weight Management/Obesity: Establish reasonable short term and long term weight goals.;Obesity: Provide education and appropriate resources to help participant work on and attain dietary goals.    Admit Weight 216 lb 14.4 oz (98.4 kg)    Goal Weight: Short Term 210 lb (95.3 kg)    Goal Weight: Long Term 170 lb (77.1 kg)    Expected Outcomes Long Term: Adherence to nutrition and physical activity/exercise program aimed toward attainment of established weight goal;Short Term: Continue to assess and  modify interventions until short term weight is achieved;Understanding recommendations for meals to include 15-35% energy as protein, 25-35% energy from fat, 35-60% energy from carbohydrates, less than $RemoveB'200mg'DaTBBrLg$  of dietary cholesterol, 20-35 gm of total fiber daily;Weight Loss: Understanding of general recommendations for a balanced  deficit meal plan, which promotes 1-2 lb weight loss per week and includes a negative energy balance of [phone] kcal/d;Understanding of distribution of calorie intake throughout the day with the consumption of 4-5 meals/snacks    Diabetes Yes    Intervention Provide education about signs/symptoms and action to take for hypo/hyperglycemia.;Provide education about proper nutrition, including hydration, and aerobic/resistive exercise prescription along with prescribed medications to achieve blood glucose in normal ranges: Fasting glucose 65-99 mg/dL    Expected Outcomes Short Term: Participant verbalizes understanding of the signs/symptoms and immediate care of hyper/hypoglycemia, proper foot care and importance of medication, aerobic/resistive exercise and nutrition plan for blood glucose control.;Long Term: Attainment of HbA1C < 7%.    Heart Failure Yes    Intervention Provide a combined exercise and nutrition program that is supplemented with education, support and counseling about heart failure. Directed toward relieving symptoms such as shortness of breath, decreased exercise tolerance, and extremity edema.    Expected Outcomes Improve functional capacity of life;Short term: Attendance in program 2-3 days a week with increased exercise capacity. Reported lower sodium intake. Reported increased fruit and vegetable intake. Reports medication compliance.;Short term: Daily weights obtained and reported for increase. Utilizing diuretic protocols set by physician.;Long term: Adoption of self-care skills and reduction of barriers for early signs and symptoms recognition and intervention  leading to self-care maintenance.    Hypertension Yes    Intervention Provide education on lifestyle modifcations including regular physical activity/exercise, weight management, moderate sodium restriction and increased consumption of fresh fruit, vegetables, and low fat dairy, alcohol moderation, and smoking cessation.;Monitor prescription use compliance.    Expected Outcomes Short Term: Continued assessment and intervention until BP is < 140/49mm HG in hypertensive participants. < 130/73mm HG in hypertensive participants with diabetes, heart failure or chronic kidney disease.;Long Term: Maintenance of blood pressure at goal levels.    Lipids Yes    Intervention Provide education and support for participant on nutrition & aerobic/resistive exercise along with prescribed medications to achieve LDL '70mg'$ , HDL >$Remo'40mg'kbRvK$ .    Expected Outcomes Short Term: Participant states understanding of desired cholesterol values and is compliant with medications prescribed. Participant is following exercise prescription and nutrition guidelines.;Long Term: Cholesterol controlled with medications as prescribed, with individualized exercise RX and with personalized nutrition plan. Value goals: LDL < $Rem'70mg'lKNd$ , HDL > 40 mg.             Education:Diabetes - Individual verbal and written instruction to review signs/symptoms of diabetes, desired ranges of glucose level fasting, after meals and with exercise. Acknowledge that pre and post exercise glucose checks will be done for 3 sessions at entry of program. [place] from 01/21/2021 in [facility] Cardiac and Pulmonary Rehab  Date 10/24/20  Educator [facility]  Instruction Review Code 1- Verbalizes Understanding       Core Components/Risk Factors/Patient Goals Review:   Goals and Risk Factor Review     Row Name 12/22/20 0923 12/22/20 0924           Core Components/Risk Factors/Patient Goals Review   Personal Goals Review Weight  Management/Obesity;Diabetes;Hypertension --      Review Bill does check BG and BP at home.  He says average fasting is about 120-130. His weight is staying steady.  He can tell his stamina is better.  He can walk to the mailbox with no problem and move better in general.      Expected Outcomes -- Short:  continue to work on weight loss and maintain heart healthy diet  Long:  keep BG and BP in optimal range               Core Components/Risk Factors/Patient Goals at Discharge (Final Review):   Goals and Risk Factor Review - 12/22/20 0924       Core Components/Risk Factors/Patient Goals Review   Review His weight is staying steady.  He can tell his stamina is better.  He can walk to the mailbox with no problem and move better in general.    Expected Outcomes Short:  continue to work on weight loss and maintain heart healthy diet Long:  keep BG and BP in optimal range             ITP Comments:  ITP Comments     Row Name 10/24/20 1322 11/03/20 0948 11/05/20 0924 11/10/20 0930 11/19/20 0641   ITP Comments Initial telephone orientation completed. Diagnosis can be found in [facility] 9/29. EP orientation scheduled for Monday 10/17 at 8am. Completed 6MWT and gym orientation. Initial ITP created and sent for review to Dr. Emily Filbert, Medical Director. First full day of exercise!  Patient was oriented to gym and equipment including functions, settings, policies, and procedures.  Patient's individual exercise prescription and treatment plan were reviewed.  All starting workloads were established based on the results of the 6 minute walk test done at initial orientation visit.  The plan for exercise progression was also introduced and progression will be customized based on patient's performance and goals. Completed initial RD consultation 30 Day review completed. Medical Director ITP review done, changes made as directed, and signed approval by Medical Director.    [place] Name 12/17/20 [phone] 01/14/21 0658  01/23/21 0928       ITP Comments 30 Day review completed. Medical Director ITP review done, changes made as directed, and signed approval by Medical Director. 30 Day review completed. Medical Director ITP review done, changes made as directed, and signed approval by Medical Director. Rudolfo graduated today from  rehab with 36 sessions completed.  Details of the patient's exercise prescription and what He needs to do in order to continue the prescription and progress were discussed with patient.  Patient was given a copy of prescription and goals.  Patient verbalized understanding.  Ranulfo plans to continue to exercise by walking and joining local gym.              Comments: Discharge ITP

## 2021-01-23 NOTE — Progress Notes (Signed)
Discharge Progress Report  Patient Details  Name: Joshua Ferrell. MRN: [id number] Date of Birth: May 15, 1945 Referring Provider:   Flowsheet Row Cardiac Rehab from 11/03/2020 in [facility] Cardiac [facility] Pul[facility] Rehab  Referring Provider Isaias Cowman MD        Number of Visits: 36  Reason for Discharge:  Patient reached a stable level of exercise. Patient independent in their exercise. Patient has met program and personal goals.  Smoking History:  Social History   Tobacco Use  Smoking Status Former   Packs/day: 0.00   Years: 0.00   Pack years: 0.00   Types: Cigarettes  Smokeless Tobacco Former   Types: Chew   Quit date: 1990    Diagnosis:  ST elevation myocardial infarction (STEMI), unspecified artery ([place])  Status post coronary artery stent placement  ADL [facility]:   Initial Exercise Prescription:  Initial Exercise Prescription - 11/03/20 0900       Date of Initial Exercise RX and Referring Provider   Date 11/03/20    Referring Provider Paraschos, Alexander MD      Oxygen   Maintain Oxygen Saturation 88% or higher      Treadmill   MPH 2    Grade 0.5    Minutes 15    METs 2.67      Recumbant Bike   Level 2    RPM 50    Watts 10    Minutes 15    METs 2      REL-XR   Level 2    Speed 50    Minutes 15    METs 2      Prescription Details   Frequency (times per week) 3    Duration Progress to 30 minutes of continuous aerobic without signs/symptoms of physical distress      Intensity   THRR 40-80% of Max Heartrate 94-127    Ratings of Perceived Exertion 11-13    Perceived Dyspnea 0-4      Progression   Progression Continue to progress workloads to maintain intensity without signs/symptoms of physical distress.      Resistance Training   Training Prescription Yes    Weight 4 lb    Reps 10-15             Discharge Exercise Prescription (Final Exercise Prescription Changes):  Exercise Prescription Changes - 01/14/21 1300        Response to Exercise   Blood Pressure (Admit) 104/60    Blood Pressure (Exit) 110/70    Heart Rate (Admit) 68 bpm    Heart Rate (Exercise) 94 bpm    Heart Rate (Exit) 76 bpm    Rating of Perceived Exertion (Exercise) 13    Symptoms none    Duration Continue with 30 min of aerobic exercise without signs/symptoms of physical distress.    Intensity THRR unchanged      Progression   Progression Continue to progress workloads to maintain intensity without signs/symptoms of physical distress.    Average METs 2.9      Resistance Training   Training Prescription Yes    Weight 5 lb    Reps 10-15      Interval Training   Interval Training No      Treadmill   MPH 2.5    Grade 1.5    Minutes 15    METs 3.43      Recumbant Bike   Level 3    Watts 20    Minutes 15    METs 2.33  Home Exercise Plan   Plans to continue exercise at Home (comment)   walking, join gym   Frequency Add 2 additional days to program exercise sessions.    Initial Home Exercises Provided 12/22/20             Functional Capacity:  6 Minute Walk     Row Name 11/03/20 0948 01/07/21 1232       6 Minute Walk   Phase Initial Discharge    Distance 1220 feet 1400 feet    Distance % Change -- 14.7 %    Distance Feet Change -- 180 ft    Walk Time 6 minutes 6 minutes    # of Rest Breaks 0 0    MPH 2.31 2.65    METS 2.24 2.77    RPE 11 13    Perceived Dyspnea  -- 1    VO2 Peak 7.85 9.7    Symptoms Yes (comment) No    Comments chronic hip pain 5/10 --    Resting HR 62 bpm 90 bpm    Resting BP 126/72 122/64    Resting Oxygen Saturation  97 % 98 %    Exercise Oxygen Saturation  during 6 min walk 96 % 96 %    Max Ex. HR 97 bpm 109 bpm    Max Ex. BP 158/74 164/78    2 Minute Post BP 134/66 --             Psychological, QOL, Others - Outcomes: PHQ 2/9: Depression screen [facility] 2/9 01/14/2021 11/03/2020  Decreased Interest 0 0  Down, Depressed, Hopeless 0 0  PHQ - 2 Score 0 0  Altered  sleeping 0 0  Tired, decreased energy 0 0  Change in appetite 0 0  Feeling bad or failure about yourself  0 0  Trouble concentrating 0 0  Moving slowly or fidgety/restless 0 0  Suicidal thoughts 0 0  PHQ-9 Score 0 0  Difficult doing work/chores Not difficult at all Not difficult at all    Quality of Life:  Quality of Life - 01/21/21 1005       Quality of Life Scores   Health/Function Pre 26.4 %    Health/Function Post 15.03 %    Health/Function % Change -43.07 %    Socioeconomic Pre 30 %    Socioeconomic Post 18.25 %    Socioeconomic % Change  -39.17 %    Psych/Spiritual Pre 30 %    Psych/Spiritual Post 17.5 %    Psych/Spiritual % Change -41.67 %    Family Pre 30 %    Family Post 18 %    Family % Change -40 %    GLOBAL Pre 28.41 %    GLOBAL Post 16.69 %    GLOBAL % Change -41.25 %              Nutrition & Weight - Outcomes:  Pre Biometrics - 11/03/20 1002       Pre Biometrics   Height 5' 8.2" (1.732 m)    Weight 216 lb 14.4 oz (98.4 kg)    BMI (Calculated) 32.8    Single Leg Stand 2.5 seconds             Post Biometrics - 01/22/21 1516        Post  Biometrics   Height 5' 8.2" (1.732 m)    Weight 214 lb 4.8 oz (97.2 kg)    BMI (Calculated) 32.4    Single Leg Stand 15.75 seconds  Nutrition:  Nutrition Therapy & Goals - 11/10/20 0845       Nutrition Therapy   Drug/Food Interactions Food/Disease    Protein (specify units) 75g    Fiber 30 grams    Whole Grain Foods 3 servings    Saturated Fats 12 max. grams    Fruits and Vegetables 8 servings/day    Sodium 1.5 grams      Personal Nutrition Goals   Nutrition Goal ST: 2-4 CHO servings at meals (make sure to have at least 2 CHO servings for breakfast to avoid BG lows during exercise) LT: Include at least 5 fruit/vegetable servings per day, limit processed meat to <3x/week    Comments On investigational study medication: semaglutide 14 mg or placebo, Historical Med. Also on  Metformin. A1C 5.8 per patient last month. B: may or may not cook - bacon or sausage with eggs and sometimes toast (whole wheat bread). Ham and egg on bun 1x/week. Cereal once in a while. Always coffee (black) S: cookie with maybe a diet coke L: Ham or [place] sandwich. Cooks chicken wings or leftovers. D: grills chicken, hamburger eveyr once in a while, fish. Green beans, sugar peas, corn, cauliflower, cabbage, collards, "whatever strikes me". Drinks: 1-3 beers before dinner and water. Discussed heart healthy eating and focused on diabetes friendly eating due to time constraints. Patient will need review/follow up education on general heart healhty eating and meal planning.      Intervention Plan   Intervention Prescribe, educate and counsel regarding individualized specific dietary modifications aiming towards targeted core components such as weight, hypertension, lipid management, diabetes, heart failure and other comorbidities.    Expected Outcomes Short Term Goal: Understand basic principles of dietary content, such as calories, fat, sodium, cholesterol and nutrients.;Short Term Goal: A plan has been developed with personal nutrition goals set during dietitian appointment.;Long Term Goal: Adherence to prescribed nutrition plan.              Education Questionnaire Score:  Knowledge Questionnaire Score - 01/21/21 1005       Knowledge Questionnaire Score   Pre Score 22/26    Post Score 26/26             Goals reviewed with patient; copy given to patient.

## 2021-01-23 NOTE — Progress Notes (Signed)
Daily Session Note  Patient Details  Name: Joshua Ferrell. MRN: [id number] Date of Birth: 22-Oct-1945 Referring Provider:   Flowsheet Row Cardiac Rehab from 11/03/2020 in [facility] Cardiac and Pulmonary Rehab  Referring Provider Joshua Cowman MD       Encounter Date: 01/23/2021  Check In:  Session Check In - 01/23/21 0926       Check-In   Supervising physician immediately available to respond to emergencies See telemetry face sheet for immediately available ER MD    Location [facility]-Cardiac & Pulmonary Rehab    Staff Present Renita Papa, RN BSN;Joseph Natoma, RCP,RRT,BSRT;Jessica [place], [place], RCEP, CCRP, CCET    Virtual Visit No    Medication changes reported     No    Fall or balance concerns reported    No    Warm-up and Cool-down Performed on first and last piece of equipment    Resistance Training Performed Yes    VAD Patient? No    PAD/SET Patient? No      Pain Assessment   Currently in Pain? No/denies                Social History   Tobacco Use  Smoking Status Former   Packs/day: 0.00   Years: 0.00   Pack years: 0.00   Types: Cigarettes  Smokeless Tobacco Former   Types: Chew   Quit date: 1990    Goals Met:  Independence with exercise equipment Exercise tolerated well No report of concerns or symptoms today Strength training completed today  Goals Unmet:  Not Applicable  Comments:  Joshua Ferrell graduated today from  rehab with 36 sessions completed.  Details of the patient's exercise prescription and what He needs to do in order to continue the prescription and progress were discussed with patient.  Patient was given a copy of prescription and goals.  Patient verbalized understanding.  Joshua Ferrell plans to continue to exercise by walking and joining local gym.    Dr. Emily Filbert is Medical Director for [place].  Dr. Ottie Glazier is Medical Director for [facility] Pulmonary Rehabilitation.

## 2021-02-11 DIAGNOSIS — M5416 Radiculopathy, lumbar region: Secondary | ICD-10-CM | POA: Diagnosis not present

## 2021-02-11 DIAGNOSIS — M47896 Other spondylosis, lumbar region: Secondary | ICD-10-CM | POA: Diagnosis not present

## 2021-02-11 DIAGNOSIS — M4316 Spondylolisthesis, lumbar region: Secondary | ICD-10-CM | POA: Diagnosis not present

## 2021-02-11 DIAGNOSIS — M545 Low back pain, unspecified: Secondary | ICD-10-CM | POA: Diagnosis not present

## 2021-02-16 DIAGNOSIS — I252 Old myocardial infarction: Secondary | ICD-10-CM | POA: Diagnosis not present

## 2021-02-16 DIAGNOSIS — R079 Chest pain, unspecified: Secondary | ICD-10-CM | POA: Diagnosis not present

## 2021-02-16 DIAGNOSIS — I214 Non-ST elevation (NSTEMI) myocardial infarction: Secondary | ICD-10-CM | POA: Diagnosis not present

## 2021-02-16 DIAGNOSIS — I10 Essential (primary) hypertension: Secondary | ICD-10-CM | POA: Diagnosis not present

## 2021-02-16 DIAGNOSIS — E785 Hyperlipidemia, unspecified: Secondary | ICD-10-CM | POA: Diagnosis not present

## 2021-02-16 DIAGNOSIS — Z23 Encounter for immunization: Secondary | ICD-10-CM | POA: Diagnosis not present

## 2021-02-16 DIAGNOSIS — E1121 Type 2 diabetes mellitus with diabetic nephropathy: Secondary | ICD-10-CM | POA: Diagnosis not present

## 2021-02-16 DIAGNOSIS — Z8673 Personal history of transient ischemic attack (TIA), and cerebral infarction without residual deficits: Secondary | ICD-10-CM | POA: Diagnosis not present

## 2021-02-16 DIAGNOSIS — M5432 Sciatica, left side: Secondary | ICD-10-CM | POA: Diagnosis not present

## 2021-02-16 DIAGNOSIS — I2510 Atherosclerotic heart disease of native coronary artery without angina pectoris: Secondary | ICD-10-CM | POA: Diagnosis not present

## 2021-02-16 DIAGNOSIS — G720 Drug-induced myopathy: Secondary | ICD-10-CM | POA: Diagnosis not present

## 2021-02-16 DIAGNOSIS — T466X5A Adverse effect of antihyperlipidemic and antiarteriosclerotic drugs, initial encounter: Secondary | ICD-10-CM | POA: Diagnosis not present

## 2021-03-03 ENCOUNTER — Other Ambulatory Visit: Payer: Self-pay | Admitting: Physical Medicine & Rehabilitation

## 2021-03-03 DIAGNOSIS — M5442 Lumbago with sciatica, left side: Secondary | ICD-10-CM | POA: Diagnosis not present

## 2021-03-03 DIAGNOSIS — M48062 Spinal stenosis, lumbar region with neurogenic claudication: Secondary | ICD-10-CM | POA: Diagnosis not present

## 2021-03-03 DIAGNOSIS — G8929 Other chronic pain: Secondary | ICD-10-CM | POA: Diagnosis not present

## 2021-03-05 DIAGNOSIS — I2510 Atherosclerotic heart disease of native coronary artery without angina pectoris: Secondary | ICD-10-CM | POA: Diagnosis not present

## 2021-03-05 DIAGNOSIS — E785 Hyperlipidemia, unspecified: Secondary | ICD-10-CM | POA: Diagnosis not present

## 2021-03-05 DIAGNOSIS — E1121 Type 2 diabetes mellitus with diabetic nephropathy: Secondary | ICD-10-CM | POA: Diagnosis not present

## 2021-03-11 ENCOUNTER — Other Ambulatory Visit: Payer: Self-pay

## 2021-03-11 ENCOUNTER — Ambulatory Visit
Admission: RE | Admit: 2021-03-11 | Discharge: 2021-03-11 | Disposition: A | Discharge status: Home / Self Care | Payer: PPO | Source: Ambulatory Visit | Attending: Physical Medicine & Rehabilitation | Admitting: Physical Medicine & Rehabilitation

## 2021-03-11 DIAGNOSIS — M5126 Other intervertebral disc displacement, lumbar region: Secondary | ICD-10-CM | POA: Diagnosis not present

## 2021-03-11 DIAGNOSIS — G8929 Other chronic pain: Secondary | ICD-10-CM | POA: Insufficient documentation

## 2021-03-11 DIAGNOSIS — M48061 Spinal stenosis, lumbar region without neurogenic claudication: Secondary | ICD-10-CM | POA: Diagnosis not present

## 2021-03-11 DIAGNOSIS — M5442 Lumbago with sciatica, left side: Secondary | ICD-10-CM | POA: Insufficient documentation

## 2021-03-12 DIAGNOSIS — T466X5A Adverse effect of antihyperlipidemic and antiarteriosclerotic drugs, initial encounter: Secondary | ICD-10-CM | POA: Diagnosis not present

## 2021-03-12 DIAGNOSIS — Z0000 Encounter for general adult medical examination without abnormal findings: Secondary | ICD-10-CM | POA: Diagnosis not present

## 2021-03-12 DIAGNOSIS — I2510 Atherosclerotic heart disease of native coronary artery without angina pectoris: Secondary | ICD-10-CM | POA: Diagnosis not present

## 2021-03-12 DIAGNOSIS — I10 Essential (primary) hypertension: Secondary | ICD-10-CM | POA: Diagnosis not present

## 2021-03-12 DIAGNOSIS — E1121 Type 2 diabetes mellitus with diabetic nephropathy: Secondary | ICD-10-CM | POA: Diagnosis not present

## 2021-03-12 DIAGNOSIS — G720 Drug-induced myopathy: Secondary | ICD-10-CM | POA: Diagnosis not present

## 2021-03-16 DIAGNOSIS — E1121 Type 2 diabetes mellitus with diabetic nephropathy: Secondary | ICD-10-CM | POA: Diagnosis not present

## 2021-03-16 DIAGNOSIS — M48062 Spinal stenosis, lumbar region with neurogenic claudication: Secondary | ICD-10-CM | POA: Diagnosis not present

## 2021-03-31 DIAGNOSIS — G8929 Other chronic pain: Secondary | ICD-10-CM | POA: Diagnosis not present

## 2021-03-31 DIAGNOSIS — M5442 Lumbago with sciatica, left side: Secondary | ICD-10-CM | POA: Diagnosis not present

## 2021-03-31 DIAGNOSIS — M48062 Spinal stenosis, lumbar region with neurogenic claudication: Secondary | ICD-10-CM | POA: Diagnosis not present

## 2021-04-09 DIAGNOSIS — E1121 Type 2 diabetes mellitus with diabetic nephropathy: Secondary | ICD-10-CM | POA: Diagnosis not present

## 2021-04-09 DIAGNOSIS — M48062 Spinal stenosis, lumbar region with neurogenic claudication: Secondary | ICD-10-CM | POA: Diagnosis not present

## 2021-04-28 DIAGNOSIS — M5442 Lumbago with sciatica, left side: Secondary | ICD-10-CM | POA: Diagnosis not present

## 2021-04-28 DIAGNOSIS — G8929 Other chronic pain: Secondary | ICD-10-CM | POA: Diagnosis not present

## 2021-04-28 DIAGNOSIS — M48062 Spinal stenosis, lumbar region with neurogenic claudication: Secondary | ICD-10-CM | POA: Diagnosis not present

## 2021-05-11 DIAGNOSIS — G8929 Other chronic pain: Secondary | ICD-10-CM | POA: Diagnosis not present

## 2021-05-11 DIAGNOSIS — M5442 Lumbago with sciatica, left side: Secondary | ICD-10-CM | POA: Diagnosis not present

## 2021-05-13 DIAGNOSIS — M6281 Muscle weakness (generalized): Secondary | ICD-10-CM | POA: Diagnosis not present

## 2021-05-13 DIAGNOSIS — G8929 Other chronic pain: Secondary | ICD-10-CM | POA: Diagnosis not present

## 2021-05-13 DIAGNOSIS — M545 Low back pain, unspecified: Secondary | ICD-10-CM | POA: Diagnosis not present

## 2021-05-15 DIAGNOSIS — M545 Low back pain, unspecified: Secondary | ICD-10-CM | POA: Diagnosis not present

## 2021-05-15 DIAGNOSIS — G8929 Other chronic pain: Secondary | ICD-10-CM | POA: Diagnosis not present

## 2021-05-15 DIAGNOSIS — M6281 Muscle weakness (generalized): Secondary | ICD-10-CM | POA: Diagnosis not present

## 2021-05-18 DIAGNOSIS — G8929 Other chronic pain: Secondary | ICD-10-CM | POA: Diagnosis not present

## 2021-05-18 DIAGNOSIS — M6281 Muscle weakness (generalized): Secondary | ICD-10-CM | POA: Diagnosis not present

## 2021-05-18 DIAGNOSIS — M545 Low back pain, unspecified: Secondary | ICD-10-CM | POA: Diagnosis not present

## 2021-05-21 DIAGNOSIS — M545 Low back pain, unspecified: Secondary | ICD-10-CM | POA: Diagnosis not present

## 2021-05-21 DIAGNOSIS — M6281 Muscle weakness (generalized): Secondary | ICD-10-CM | POA: Diagnosis not present

## 2021-05-21 DIAGNOSIS — G8929 Other chronic pain: Secondary | ICD-10-CM | POA: Diagnosis not present

## 2021-05-25 DIAGNOSIS — M545 Low back pain, unspecified: Secondary | ICD-10-CM | POA: Diagnosis not present

## 2021-05-25 DIAGNOSIS — G8929 Other chronic pain: Secondary | ICD-10-CM | POA: Diagnosis not present

## 2021-05-25 DIAGNOSIS — M6281 Muscle weakness (generalized): Secondary | ICD-10-CM | POA: Diagnosis not present

## 2021-05-26 DIAGNOSIS — M4310 Spondylolisthesis, site unspecified: Secondary | ICD-10-CM | POA: Diagnosis not present

## 2021-05-26 DIAGNOSIS — E1121 Type 2 diabetes mellitus with diabetic nephropathy: Secondary | ICD-10-CM | POA: Diagnosis not present

## 2021-05-26 DIAGNOSIS — M5416 Radiculopathy, lumbar region: Secondary | ICD-10-CM | POA: Diagnosis not present

## 2021-05-28 DIAGNOSIS — M6281 Muscle weakness (generalized): Secondary | ICD-10-CM | POA: Diagnosis not present

## 2021-05-28 DIAGNOSIS — G8929 Other chronic pain: Secondary | ICD-10-CM | POA: Diagnosis not present

## 2021-05-28 DIAGNOSIS — M545 Low back pain, unspecified: Secondary | ICD-10-CM | POA: Diagnosis not present

## 2021-05-29 ENCOUNTER — Other Ambulatory Visit: Payer: Self-pay | Admitting: Neurosurgery

## 2021-05-29 DIAGNOSIS — Z01818 Encounter for other preprocedural examination: Secondary | ICD-10-CM

## 2021-06-03 DIAGNOSIS — E1121 Type 2 diabetes mellitus with diabetic nephropathy: Secondary | ICD-10-CM | POA: Diagnosis not present

## 2021-06-04 DIAGNOSIS — G8929 Other chronic pain: Secondary | ICD-10-CM | POA: Diagnosis not present

## 2021-06-04 DIAGNOSIS — M545 Low back pain, unspecified: Secondary | ICD-10-CM | POA: Diagnosis not present

## 2021-06-08 DIAGNOSIS — T466X5A Adverse effect of antihyperlipidemic and antiarteriosclerotic drugs, initial encounter: Secondary | ICD-10-CM | POA: Diagnosis not present

## 2021-06-08 DIAGNOSIS — G720 Drug-induced myopathy: Secondary | ICD-10-CM | POA: Diagnosis not present

## 2021-06-08 DIAGNOSIS — I10 Essential (primary) hypertension: Secondary | ICD-10-CM | POA: Diagnosis not present

## 2021-06-08 DIAGNOSIS — Z01810 Encounter for preprocedural cardiovascular examination: Secondary | ICD-10-CM | POA: Diagnosis not present

## 2021-06-08 DIAGNOSIS — I214 Non-ST elevation (NSTEMI) myocardial infarction: Secondary | ICD-10-CM | POA: Diagnosis not present

## 2021-06-08 DIAGNOSIS — Z01818 Encounter for other preprocedural examination: Secondary | ICD-10-CM | POA: Diagnosis not present

## 2021-06-08 DIAGNOSIS — I2510 Atherosclerotic heart disease of native coronary artery without angina pectoris: Secondary | ICD-10-CM | POA: Diagnosis not present

## 2021-06-11 DIAGNOSIS — M6281 Muscle weakness (generalized): Secondary | ICD-10-CM | POA: Diagnosis not present

## 2021-06-11 DIAGNOSIS — G8929 Other chronic pain: Secondary | ICD-10-CM | POA: Diagnosis not present

## 2021-06-11 DIAGNOSIS — M545 Low back pain, unspecified: Secondary | ICD-10-CM | POA: Diagnosis not present

## 2021-06-12 ENCOUNTER — Encounter
Admission: RE | Admit: 2021-06-12 | Discharge: 2021-06-12 | Disposition: A | Discharge status: Home / Self Care | Payer: PPO | Source: Ambulatory Visit | Attending: Neurosurgery | Admitting: Neurosurgery

## 2021-06-12 VITALS — BP 144/68 | HR 61 | Resp 16 | Ht 68.0 in | Wt 211.6 lb

## 2021-06-12 DIAGNOSIS — I10 Essential (primary) hypertension: Secondary | ICD-10-CM | POA: Diagnosis not present

## 2021-06-12 DIAGNOSIS — I25110 Atherosclerotic heart disease of native coronary artery with unstable angina pectoris: Secondary | ICD-10-CM | POA: Diagnosis not present

## 2021-06-12 DIAGNOSIS — E119 Type 2 diabetes mellitus without complications: Secondary | ICD-10-CM | POA: Diagnosis not present

## 2021-06-12 DIAGNOSIS — Z01812 Encounter for preprocedural laboratory examination: Secondary | ICD-10-CM | POA: Diagnosis not present

## 2021-06-12 DIAGNOSIS — I214 Non-ST elevation (NSTEMI) myocardial infarction: Secondary | ICD-10-CM | POA: Insufficient documentation

## 2021-06-12 DIAGNOSIS — Z8673 Personal history of transient ischemic attack (TIA), and cerebral infarction without residual deficits: Secondary | ICD-10-CM | POA: Insufficient documentation

## 2021-06-12 DIAGNOSIS — Z01818 Encounter for other preprocedural examination: Secondary | ICD-10-CM

## 2021-06-12 LAB — URINALYSIS, ROUTINE W REFLEX MICROSCOPIC
Bilirubin Urine: NEGATIVE
Glucose, UA: NEGATIVE mg/dL
Hgb urine dipstick: NEGATIVE
Ketones, ur: NEGATIVE mg/dL
Leukocytes,Ua: NEGATIVE
Nitrite: NEGATIVE
Protein, ur: NEGATIVE mg/dL
Specific Gravity, Urine: 1.017 (ref 1.005–1.030)
pH: 5 (ref 5.0–8.0)

## 2021-06-12 LAB — CBC
HCT: 41.3 % (ref 39.0–52.0)
Hemoglobin: 13.7 g/dL (ref 13.0–17.0)
MCH: 31.3 pg (ref 26.0–34.0)
MCHC: 33.2 g/dL (ref 30.0–36.0)
MCV: 94.3 fL (ref 80.0–100.0)
Platelets: 180 10*3/uL (ref 150–400)
RBC: 4.38 MIL/uL (ref 4.22–5.81)
RDW: 12.7 % (ref 11.5–15.5)
WBC: 6.8 10*3/uL (ref 4.0–10.5)
nRBC: 0 % (ref 0.0–0.2)

## 2021-06-12 LAB — BASIC METABOLIC PANEL
Anion gap: 10 (ref 5–15)
BUN: 21 mg/dL (ref 8–23)
CO2: 27 mmol/L (ref 22–32)
Calcium: 9.4 mg/dL (ref 8.9–10.3)
Chloride: 102 mmol/L (ref 98–111)
Creatinine, Ser: 0.95 mg/dL (ref 0.61–1.24)
GFR, Estimated: >60 mL/min (ref >60)
Glucose, Bld: 85 mg/dL (ref 70–99)
Potassium: 4 mmol/L (ref 3.5–5.1)
Sodium: 139 mmol/L (ref 135–145)

## 2021-06-12 LAB — SURGICAL PCR SCREEN
MRSA, PCR: NEGATIVE
Staphylococcus aureus: NEGATIVE

## 2021-06-12 NOTE — Patient Instructions (Addendum)
Your procedure is scheduled on:06-24-21 Wednesday Report to the Registration Desk on the 1st floor of the [facility].Then proceed to the 2nd floor Surgery Desk To find out your arrival time, please call [phone] between 1PM - 3PM on:06-23-21 Tuesday If your arrival time is 6:00 am, do not arrive prior to that time as the [place] entrance doors do not open until 6:00 am.  REMEMBER: Instructions that are not followed completely may result in serious medical risk, up to and including death; or upon the discretion of your surgeon and anesthesiologist your surgery may need to be rescheduled.  Do not eat food after midnight the night before surgery.  No gum chewing, lozengers or hard candies.  You may however, drink Water up to 2 hours before you are scheduled to arrive for your surgery. Do not drink anything within 2 hours of your scheduled arrival time.  Type 1 and Type 2 diabetics should only drink water.  TAKE THESE MEDICATIONS THE MORNING OF SURGERY WITH A SIP OF WATER: -amLODipine (NORVASC)   -metoprolol succinate (TOPROL-XL)  Stop your clopidogrel (PLAVIX) 7 days prior to surgery-Last dose on 06-16-21 Tuesday  Continue your 81 mg Aspirin up until the day prior to surgery-Do NOT take the day of surgery  Stop your metFORMIN (GLUCOPHAGE) 2 days prior to surgery-Last dose on 06-21-21 Sunday  One week prior to surgery: Stop Anti-inflammatories (NSAIDS) such as Advil, Aleve, Ibuprofen, Motrin, Naproxen, Naprosyn and Aspirin based products such as Excedrin, Goodys Powder, BC Powder.You may however, take Tylenol if needed for pain up until the day of surgery.  Stop ANY OVER THE COUNTER supplements/vitamins 7 days prior to surgery   No Alcohol for 24 hours before or after surgery.  No Smoking including e-cigarettes for 24 hours prior to surgery.  No chewable tobacco products for at least 6 hours prior to surgery.  No nicotine patches on the day of surgery.  Do not use any  "recreational" drugs for at least a week prior to your surgery.  Please be advised that the combination of cocaine and anesthesia may have negative outcomes, up to and including death. If you test positive for cocaine, your surgery will be cancelled.  On the morning of surgery brush your teeth with toothpaste and water, you may rinse your mouth with mouthwash if you wish. Do not swallow any toothpaste or mouthwash.  Use CHG Soap or wipes as directed on instruction sheet.  Do not wear jewelry, make-up, hairpins, clips or nail polish.  Do not wear lotions, powders, or perfumes.   Do not shave body from the neck down 48 hours prior to surgery just in case you cut yourself which could leave a site for infection.  Also, freshly shaved skin may become irritated if using the CHG soap.  Contact lenses, hearing aids and dentures may not be worn into surgery.  Do not bring valuables to the hospital. [facility] is not responsible for any missing/lost belongings or valuables.   Total Shoulder Arthroplasty:  use Benzolyl Peroxide 5% Gel as directed on instruction sheet.  Fleets enema or bowel prep as directed.  Bring your C-PAP to the hospital with you in case you may have to spend the night.   Notify your doctor if there is any change in your medical condition (cold, fever, infection).  Wear comfortable clothing (specific to your surgery type) to the hospital.  After surgery, you can help prevent lung complications by doing breathing exercises.  Take deep breaths and cough  every 1-2 hours. Your doctor may order a device called an Incentive Spirometer to help you take deep breaths. When coughing or sneezing, hold a pillow firmly against your incision with both hands. This is called "splinting." Doing this helps protect your incision. It also decreases belly discomfort.  If you are being admitted to the hospital overnight, leave your suitcase in the car. After surgery it may be brought to your  room.  If you are being discharged the day of surgery, you will not be allowed to drive home. You will need a responsible adult (18 years or older) to drive you home and stay with you that night.   If you are taking public transportation, you will need to have a responsible adult (18 years or older) with you. Please confirm with your physician that it is acceptable to use public transportation.   Please call the [place] Dept. at [phone] if you have any questions about these instructions.  Surgery Visitation Policy:  Patients undergoing a surgery or procedure may have two family members or support persons with them as long as the person is not COVID-19 positive or experiencing its symptoms.   Inpatient Visitation:    Visiting hours are 7 a.m. to 8 p.m. Up to four visitors are allowed at one time in a patient room, including children. The visitors may rotate out with other people during the day. One designated support person (adult) may remain overnight.

## 2021-06-16 LAB — TYPE AND SCREEN
ABO/RH(D): A POS
Antibody Screen: NEGATIVE

## 2021-06-18 DIAGNOSIS — G8929 Other chronic pain: Secondary | ICD-10-CM | POA: Diagnosis not present

## 2021-06-18 DIAGNOSIS — M545 Low back pain, unspecified: Secondary | ICD-10-CM | POA: Diagnosis not present

## 2021-06-22 ENCOUNTER — Encounter: Payer: Self-pay | Admitting: Neurosurgery

## 2021-06-22 NOTE — Progress Notes (Signed)
Perioperative Services  Pre-Admission/Anesthesia Testing Clinical Review  Date: 06/22/21  Patient Demographics:  Name: Joshua Ferrell. DOB:   December 02, 1945 MRN:   [id number]  Planned Surgical Procedure(s):    Case: [id number] Date/Time: 06/24/21 1422   Procedures:      L4-5 LATERAL LUMBAR INTERBODY FUSION AND POSTERIOR SPINAL FUSION     LEFT L5-S1 LAMINOFORAMINOTOMY     APPLICATION OF INTRAOPERATIVE CT SCAN   Anesthesia type: General   Pre-op diagnosis:      M43.10-Anterolisthesis      M45.16-Lumbar Radiculopathy   Location: [facility] OR ROOM 03 / [place] [facility] FOR ANESTHESIA GROUP   Surgeons: Meade Maw, MD   NOTE: Available PAT nursing documentation and vital signs have been reviewed. Clinical nursing staff has updated patient's PMH/PSHx, current medication list, and drug allergies/intolerances to ensure comprehensive history available to assist in medical decision making as it pertains to the aforementioned surgical procedure and anticipated anesthetic course. Extensive review of available clinical information performed. Joshua Ferrell PMH and PSHx updated with any diagnoses/procedures that  may have been inadvertently omitted during his intake with the pre-admission testing department's nursing staff.  Clinical Discussion:  Joshua Ferrell. is a 76 y.o. male who is submitted for pre-surgical anesthesia review and clearance prior to him undergoing the above procedure. Patient has never been a smoker. Pertinent PMH includes: CAD, MI x 2, diastolic dysfunction, aortic stenosis, aortic atherosclerosis, HTN, HLD, T2DM, DOE, GERD (no daily Tx), OA.  Patient is followed by cardiology Saralyn Pilar, MD). He was last seen in the cardiology clinic on 06/08/2021; notes reviewed.  At the time of his clinic visit, patient doing well overall from a cardiovascular perspective.  He denied any episodes of chest pain, shortness of breath, PND, orthopnea, palpitations, significant peripheral edema,  vertiginous symptoms, or presyncope/syncope.  Patient with a past medical history significant for cardiovascular diagnoses.  Patient suffered an inferior wall STEMI on 06/24/2000.  Diagnostic left heart catheterization was performed with subsequent PCI of the mid RCA placing a single stent (type unknown).  Patient suffered an NSTEMI on 06/12/2020.  Diagnostic left heart catheterization performed revealing a mildly reduced left ventricular systolic function with an EF of 45-50%.  There was multivessel CAD; 100% ostial RCA, 95% ostial to proximal LAD, 70% 2nd LPL, 70% 3rd LPL, 30% proximal LCx, and 50% mid LAD.  PCI performed placing a 2.75 x 15 mm resolute Onyx DES to the ostial to proximal LAD lesion yielding excellent angiographic result and TIMI-3 flow.  Last TTE was performed on 06/13/2020 revealing a mildly decreased left ventricular systolic function with an EF of 45-50%. Diastolic Doppler parameters consistent with abnormal relaxation (G1DD).  There was mild mitral valve regurgitation noted.  Additionally, there was mild aortic valve stenosis (mean gradient 5.0 mmHg.  Following stent placement, patient was prescribed daily DAPT therapy (ASA + clopidogrel).  He remains on the clopidogrel as prescribed, however patient discontinued ASA approximately 3 months prior to visit with cardiology citing that he did not know he needed to continue it.  He was advised to restart ASA dose patient compliant with clinical therapy with no evidence or reports of GI bleeding. Blood pressure well controlled at 136/72 on currently prescribed beta-blocker, CCB, ARB, and diuretic therapies.  Patient with a history of statin myopathy, therefore he is taking ezetimibe alone for his HLD diagnosis and further ASCVD prevention.  T2DM well-controlled on currently prescribed regimen; last HgbA1c was 6.9% when checked on 06/03/2021.  No other changes were made to  his medication regimen.  Patient to follow-up with outpatient  cardiology in 4 months or sooner if needed.  Joshua Crow. is scheduled for an L4-5 LATERAL LUMBAR INTERBODY FUSION AND POSTERIOR SPINAL FUSION; LEFT L5-S1 LAMINOFORAMINOTOMY on 06/24/2021 with Dr. Meade Maw, MD. Given patient's past medical history significant for cardiovascular diagnoses, presurgical cardiac clearance was sought by the performing surgeon's office and PAT team.  Per cardiology, "patient has been medically optimized, is currently asymptomatic, and requires no further cardiovascular evaluation at this time.  He may proceed with planned surgical intervention at an overall ACCEPTABLE risk of significant perioperative cardiovascular complications".  Again, this patient is on daily antiplatelet therapy.  He has been instructed on recommendations for holding his clopidogrel for 7 days prior to his procedure with plans to discontinue postoperatively.  Again, patient advised to restart his ASA dose and continue it throughout his perioperative course.  Patient will be on ASA only postoperatively.  Patient reports previous perioperative complications with anesthesia in the past. Patient has a PMH (+) for PONV. Symptoms and history of PONV will be discussed with patient by anesthesia team on the day of her procedure. Interventions will be ordered as deemed necessary based on patient's individual care needs as determined by anesthesiologist. In review of the available records, it is noted that patient underwent a MAC anesthetic course here at [facility] (ASA III) in 04/2020 without documented complications.      06/12/2021    1:40 PM 01/22/2021    3:16 PM 11/03/2020   10:02 AM  Vitals with BMI  Height _0  5' 8.2" 5' 8.2"  Weight 211 lbs 10 oz 214 lbs 5 oz 216 lbs 14 oz  BMI 32.19 81.8 29.9  Systolic 371    Diastolic 68    Pulse 61      Providers/Specialists:   NOTE: Primary physician provider listed below. Patient may have been seen by APP or partner within same  practice.   PROVIDER ROLE / SPECIALTY LAST Dola Factor, MD Neurosurgery (Surgeon) 05/26/2021  Kirk Ruths, MD Primary Care Provider 03/12/2021  Isaias Cowman, MD Cardiology 06/08/2021  Girtha Hake, MD Physiatry 04/28/2021   Allergies:  Statins and Celebrex [celecoxib]  Current Home Medications:   No current facility-administered medications for this encounter.    acetaminophen (TYLENOL) 650 MG CR tablet   amLODipine (NORVASC) 5 MG tablet   aspirin EC 81 MG tablet   clopidogrel (PLAVIX) 75 MG tablet   ezetimibe (ZETIA) 10 MG tablet   glimepiride (AMARYL) 2 MG tablet   Investigational - Study Medication   metFORMIN (GLUCOPHAGE) 500 MG tablet   metoprolol succinate (TOPROL-XL) 50 MG 24 hr tablet   nitroGLYCERIN (NITROSTAT) 0.4 MG SL tablet   valsartan-hydrochlorothiazide (DIOVAN-HCT) 160-12.5 MG tablet   History:   Past Medical History:  Diagnosis Date   Aortic atherosclerosis (HCC)    Aortic stenosis 06/13/2020   a.)  TTE 06/13/2020: EF 45-50%; mild AS with MPG 5 mmHg; AVA (VTI) = 1.94 cm.   Arthritis    Coronary artery disease    a.) Inferior STEMI 06/24/2000 -> PCI placing stent (unknown type) to mRCA. b.) NSTEMI 06/12/2020 --> LHC 06/12/2020: 100% oLAD, 95% o-pLAD, 70% 2nd LPL, 70% 3rd LPL, 30% pLCx, 50% mLAD --> PCI placing a 2.75 x 15 mm Resolute Onyx DES to o-pLAD.   Diastolic dysfunction    a.)  TTE 06/13/2020: EF 45-50%; G1DD.   DOE (dyspnea on exertion)    GERD (gastroesophageal reflux  disease)    Headache(784.0)    History of 2019 novel coronavirus disease (COVID-19) 12/31/2018   History of blood transfusion 1990   internal injuries after a fall requiring blood   History of colonic polyps    Hyperlipidemia    Hypertension    Long term current use of antithrombotics/antiplatelets    a.) clopidogrel   NSTEMI (non-ST elevated myocardial infarction) ([place]) 06/12/2020   a.) LHC 06/12/2020: 100% oLAD, 95% o-pLAD, 70% 2nd LPL, 70%  3rd LPL, 30% pLCx, 50% mLAD --> PCI placing a 2.75 x 15 mm Resolute Onyx DES to o-pLAD.   PONV (postoperative nausea and vomiting)    one time only (surgery in 1990)   ST elevation myocardial infarction (STEMI) of inferior wall ([place]) 06/24/2000   a.) PCI 06/24/2000 placing stent (unknown type) to mRCA   Statin myopathy    T2DM (type 2 diabetes mellitus) [facility])    Past Surgical History:  Procedure Laterality Date   CATARACT EXTRACTION W/PHACO Right 08/13/2015   Procedure: CATARACT EXTRACTION PHACO AND INTRAOCULAR LENS PLACEMENT ([place]);  Surgeon: Estill Cotta, MD;  Location: [facility] ORS;  Service: Ophthalmology;  Laterality: Right;  [place] 01:08 AP% 24.9 CDE 32.40 fluid pack lot # [id number]   CATARACT EXTRACTION W/PHACO Left 04/23/2020   Procedure: CATARACT EXTRACTION PHACO AND INTRAOCULAR LENS PLACEMENT ([facility])LEFT DIABETIC 14.14 02:15.2 10.5%;  Surgeon: Leandrew Koyanagi, MD;  Location: [facility];  Service: Ophthalmology;  Laterality: Left;  Diabetic - oral meds   COLONOSCOPY     COLONOSCOPY WITH PROPOFOL N/A 08/24/2017   Procedure: COLONOSCOPY WITH PROPOFOL;  Surgeon: Manya Silvas, MD;  Location: [facility] ENDOSCOPY;  Service: Endoscopy;  Laterality: N/A;   CORONARY ANGIOPLASTY WITH STENT PLACEMENT Left 06/24/2000   Procedure: CORONARY ANGIOPLASTY WITH STENT PLACEMENT; Location: [facility]   CORONARY/GRAFT ACUTE MI REVASCULARIZATION N/A 06/12/2020   Procedure: Coronary/Graft Acute MI Revascularization;  Surgeon: Isaias Cowman, MD;  Location: [place] CV LAB;  Service: Cardiovascular;  Laterality: N/A;   LEFT HEART CATH AND CORONARY ANGIOGRAPHY N/A 06/12/2020   Procedure: LEFT HEART CATH AND CORONARY ANGIOGRAPHY;  Surgeon: Isaias Cowman, MD;  Location: [place] CV LAB;  Service: Cardiovascular;  Laterality: N/A;   LUMBAR LAMINECTOMY/ DECOMPRESSION WITH MET-RX N/A 10/30/2018   Procedure: L4-5 LUMBAR LAMINECTOMY;  Surgeon: Deetta Perla, MD;  Location: [facility] ORS;   Service: Neurosurgery;  Laterality: N/A;   LUMBAR LAMINECTOMY/DECOMPRESSION MICRODISCECTOMY Bilateral 10/23/2012   Procedure: LUMBAR LAMINECTOMY/DECOMPRESSION MICRODISCECTOMY 1 LEVEL;  Surgeon: Otilio Connors, MD;  Location: [facility]URO ORS;  Service: Neurosurgery;  Laterality: Bilateral;  LUMBAR LAMINECTOMY/DECOMPRESSION MICRODISCECTOMY 1 LEVEL   PENILE PROSTHESIS IMPLANT  1994   x3   SUPRAPUBIC CATHETER PLACEMENT     and removal   URETHERAL RE-IMPLANTATION     Family History  Problem Relation Age of Onset   CAD Mother    Heart attack Father    Social History   Tobacco Use   Smoking status: Never   Smokeless tobacco: Former    Types: Nurse, children's Use: Never used  Substance Use Topics   Alcohol use: Yes    Alcohol/week: 5.0 standard drinks    Types: 5 Cans of beer per week    Comment: not every day but few times per week   Drug use: No    Pertinent Clinical Results:  LABS: Labs reviewed: Acceptable for surgery.  No visits with results within 3 Day(s) from this visit.  Latest known visit with results is:  Hospital Outpatient Visit  on 06/12/2021  Component Date Value Ref Range Status   WBC 06/12/2021 6.8  4.0 - 10.5 K/uL Final   RBC 06/12/2021 4.38  4.22 - 5.81 MIL/uL Final   Hemoglobin 06/12/2021 13.7  13.0 - 17.0 g/dL Final   HCT 06/12/2021 41.3  39.0 - 52.0 % Final   MCV 06/12/2021 94.3  80.0 - 100.0 fL Final   MCH 06/12/2021 31.3  26.0 - 34.0 pg Final   MCHC 06/12/2021 33.2  30.0 - 36.0 g/dL Final   RDW 06/12/2021 12.7  11.5 - 15.5 % Final   Platelets 06/12/2021 180  150 - 400 K/uL Final   nRBC 06/12/2021 0.0  0.0 - 0.2 % Final   Performed at [facility], [address] [place], [place] [address]   Sodium 06/12/2021 139  135 - 145 mmol/L Final   Potassium 06/12/2021 4.0  3.5 - 5.1 mmol/L Final   Chloride 06/12/2021 102  98 - 111 mmol/L Final   CO2 06/12/2021 27  22 - 32 mmol/L Final   Glucose, Bld 06/12/2021 85  70 - 99 mg/dL Final    Glucose reference range applies only to samples taken after fasting for at least 8 hours.   BUN 06/12/2021 21  8 - 23 mg/dL Final   Creatinine, Ser 06/12/2021 0.95  0.61 - 1.24 mg/dL Final   Calcium 06/12/2021 9.4  8.9 - 10.3 mg/dL Final   GFR, Estimated 06/12/2021 >60  >60 mL/min Final   Comment: (NOTE) Calculated using the CKD-EPI Creatinine Equation (2021)    Anion gap 06/12/2021 10  5 - 15 Final   Performed at [facility], [address] [place], [place] [address]   ABO/RH(D) 06/12/2021 A POS   Final   Antibody Screen 06/12/2021 NEG   Final   Sample Expiration 06/12/2021 06/26/2021, 2359   Final   Extend sample reason 06/12/2021    Final                   Value:NO TRANSFUSIONS OR PREGNANCY IN THE PAST 3 MONTHS Performed at [facility], [address] [place], [place] [address]    Color, Urine 06/12/2021 YELLOW (A)  YELLOW Final   APPearance 06/12/2021 CLEAR (A)  CLEAR Final   Specific Gravity, Urine 06/12/2021 1.017  1.005 - 1.030 Final   pH 06/12/2021 5.0  5.0 - 8.0 Final   Glucose, UA 06/12/2021 NEGATIVE  NEGATIVE mg/dL Final   Hgb urine dipstick 06/12/2021 NEGATIVE  NEGATIVE Final   Bilirubin Urine 06/12/2021 NEGATIVE  NEGATIVE Final   Ketones, ur 06/12/2021 NEGATIVE  NEGATIVE mg/dL Final   Protein, ur 06/12/2021 NEGATIVE  NEGATIVE mg/dL Final   Nitrite 06/12/2021 NEGATIVE  NEGATIVE Final   Leukocytes,Ua 06/12/2021 NEGATIVE  NEGATIVE Final   Performed at [facility], [address] [place], [place] [address]   MRSA, PCR 06/12/2021 NEGATIVE  NEGATIVE Final   Staphylococcus aureus 06/12/2021 NEGATIVE  NEGATIVE Final   Comment: (NOTE) The Xpert SA Assay (FDA approved for NASAL specimens in patients 59 years of age and older), is one component of a comprehensive surveillance program. It is not intended to diagnose infection nor to guide or monitor treatment. Performed at [facility], [address] [place], [place] [address]      ECG: Date: 06/08/2021 Rate: 69 bpm Rhythm:  Sinus rhythm with occasional PVCs Axis (leads I and aVF): Normal Intervals: PR 202 ms. QRS 90 ms. QTc 430 ms. ST segment and T wave changes: Inferior T  wave abnormality.  Evidence of an age undetermined anterior infarct present.   Comparison: Similar to previous tracing obtained on 06/12/2020 NOTE: Tracing obtained at [facility]; unable for review. Above based on cardiologist's interpretation.    IMAGING / PROCEDURES: TRANSTHORACIC ECHOCARDIOGRAM performed on 06/13/2020 Left ventricular ejection fraction, by estimation, is 45 to 50%. The left ventricle has mildly decreased function. The left ventricle has no regional wall motion abnormalities. Left ventricular diastolic parameters are consistent with Grade I diastolic dysfunction (impaired relaxation). Right ventricular systolic function is normal. The right ventricular size is normal. The mitral valve is normal in structure. Mild mitral valve regurgitation. No evidence of mitral stenosis.  The aortic valve is normal in structure. Aortic valve regurgitation is not visualized. Mild aortic valve stenosis. The inferior vena cava is normal in size with greater than 50% respiratory variability, suggesting right atrial pressure of 3 mmHg.   LEFT HEART CATHETERIZATION AND CORONARY ANGIOGRAPHY performed on 06/12/2020 Mildly reduced left ventricular systolic function with an EF of 45-50% Multivessel CAD 100% stenosis of the ostial RCA 95% stenosis of the ostial to proximal LAD 70% stenosis of the second LPL  70% stenosis of the third LPL 30% stenosis of the proximal RCA 50% stenosis of the mid LAD Successful PCI 2.75 x 15 mm RESOLUTE ONYX DES x1 placed to the ostial to proximal LAD lesion with excellent angiographic result and TIMI-3 flow    STRESS ECHOCARDIOGRAM performed on 12/28/2019 LVEF 50% Able to achieve a workload of 10 METS during exercise No valvular stenosis Normal stress  echocardiogram  MYOCARDIAL PERFUSION IMAGING STUDY (LEXISCAN) performed on 10/10/2018 LVEF 38% Anterior, apical, and septal wall hypokinesis No artifact Left ventricular cavity size enlarged Small reversible perfusion abnormality in the lateral region on stress images  Impression and Plan:  Joshua Crow. has been referred for pre-anesthesia review and clearance prior to him undergoing the planned anesthetic and procedural courses. Available labs, pertinent testing, and imaging results were personally reviewed by me. This patient has been appropriately cleared by cardiology with an overall ACCEPTABLE risk of significant perioperative cardiovascular complications.  Based on clinical review performed today (06/22/21), barring any significant acute changes in the patient's overall condition, it is anticipated that he will be able to proceed with the planned surgical intervention. Any acute changes in clinical condition may necessitate his procedure being postponed and/or cancelled. Patient will meet with anesthesia team (MD and/or CRNA) on the day of his procedure for preoperative evaluation/assessment. Questions regarding anesthetic course will be fielded at that time.   Pre-surgical instructions were reviewed with the patient during his PAT appointment and questions were fielded by PAT clinical staff. Patient was advised that if any questions or concerns arise prior to his procedure then he should return a call to PAT and/or his surgeon's office to discuss.  Honor Loh, MSN, APRN, FNP-C, CEN [facility]  Peri-operative Services Nurse Practitioner Phone: [phone] Fax: [phone] 06/22/21 1:20 PM  NOTE: This note has been prepared using Dragon dictation software. Despite my best ability to proofread, there is always the potential that unintentional transcriptional errors may still occur from this process.

## 2021-06-23 MED ORDER — VANCOMYCIN HCL 1500 MG/300ML IV SOLN
1500.0000 mg | INTRAVENOUS | Status: AC
  Administered 2021-06-24 (×2): 1500 mg via INTRAVENOUS
  Filled 2021-06-23: qty 300

## 2021-06-23 MED ORDER — LACTATED RINGERS IV SOLN: INTRAVENOUS | Status: DC

## 2021-06-23 MED ORDER — SODIUM CHLORIDE 0.9 % IV SOLN: INTRAVENOUS | Status: DC

## 2021-06-23 MED ORDER — CHLORHEXIDINE GLUCONATE 0.12 % MT SOLN: 15.0000 mL | Freq: Once | OROMUCOSAL | Status: AC

## 2021-06-23 MED ORDER — CEFAZOLIN SODIUM-DEXTROSE 2-4 GM/100ML-% IV SOLN
2.0000 g | INTRAVENOUS | Status: AC
  Administered 2021-06-24: 2 g via INTRAVENOUS

## 2021-06-23 MED ORDER — FAMOTIDINE 20 MG PO TABS: 20.0000 mg | ORAL_TABLET | Freq: Once | ORAL | Status: AC

## 2021-06-23 MED ORDER — ORAL CARE MOUTH RINSE: 15.0000 mL | Freq: Once | OROMUCOSAL | Status: AC

## 2021-06-23 NOTE — Progress Notes (Signed)
Pharmacy Antibiotic Note  Joshua Ferrell. is a 76 y.o. male admitted on (Not on file) with surgical prophylaxis.  Pharmacy has been consulted for Cefazolin, Vancomycin dosing.  Plan: TBW = 96 kg   Cefazolin 2 gm IV X 1 60 min pre-op ordered for 6/7 @ 0500.  Vancomycin 1500 mg (15 mg/kg) IV X 1 60 min pre-op ordered for 6/7 @ 0500.      No data recorded.  No results for input(s): WBC, CREATININE, LATICACIDVEN, VANCOTROUGH, VANCOPEAK, VANCORANDOM, GENTTROUGH, GENTPEAK, GENTRANDOM, TOBRATROUGH, TOBRAPEAK, TOBRARND, AMIKACINPEAK, AMIKACINTROU, AMIKACIN in the last 168 hours.  Estimated Creatinine Clearance: 72 mL/min (by C-G formula based on SCr of 0.95 mg/dL).    Allergies  Allergen Reactions   Statins     Muscle pain    Celebrex [Celecoxib] Itching    Antimicrobials this admission:   >>    >>   Dose adjustments this admission:   Microbiology results:  BCx:   UCx:    Sputum:    MRSA PCR:   Thank you for allowing pharmacy to be a part of this patient's care.  Foye Haggart D 06/23/2021 10:16 PM

## 2021-06-24 ENCOUNTER — Other Ambulatory Visit: Payer: Self-pay

## 2021-06-24 ENCOUNTER — Encounter
Admission: RE | Disposition: A | Discharge status: Home Health | Payer: Self-pay | Source: Home / Self Care | Attending: Neurosurgery

## 2021-06-24 ENCOUNTER — Inpatient Hospital Stay: Payer: PPO

## 2021-06-24 ENCOUNTER — Encounter: Payer: Self-pay | Admitting: Neurosurgery

## 2021-06-24 ENCOUNTER — Inpatient Hospital Stay: Payer: PPO | Admitting: Urgent Care

## 2021-06-24 ENCOUNTER — Inpatient Hospital Stay
Admission: RE | Admit: 2021-06-24 | Discharge: 2021-06-26 | DRG: 455 | Disposition: A | Discharge status: Home Health | Payer: PPO | Attending: Neurosurgery | Admitting: Neurosurgery

## 2021-06-24 DIAGNOSIS — I10 Essential (primary) hypertension: Secondary | ICD-10-CM | POA: Diagnosis not present

## 2021-06-24 DIAGNOSIS — E119 Type 2 diabetes mellitus without complications: Secondary | ICD-10-CM | POA: Diagnosis present

## 2021-06-24 DIAGNOSIS — M4316 Spondylolisthesis, lumbar region: Secondary | ICD-10-CM | POA: Diagnosis not present

## 2021-06-24 DIAGNOSIS — Z8249 Family history of ischemic heart disease and other diseases of the circulatory system: Secondary | ICD-10-CM

## 2021-06-24 DIAGNOSIS — I252 Old myocardial infarction: Secondary | ICD-10-CM

## 2021-06-24 DIAGNOSIS — Z981 Arthrodesis status: Secondary | ICD-10-CM | POA: Diagnosis not present

## 2021-06-24 DIAGNOSIS — Z7984 Long term (current) use of oral hypoglycemic drugs: Secondary | ICD-10-CM

## 2021-06-24 DIAGNOSIS — M4310 Spondylolisthesis, site unspecified: Secondary | ICD-10-CM | POA: Diagnosis not present

## 2021-06-24 DIAGNOSIS — Z7902 Long term (current) use of antithrombotics/antiplatelets: Secondary | ICD-10-CM | POA: Diagnosis not present

## 2021-06-24 DIAGNOSIS — I700 Atherosclerosis of aorta: Secondary | ICD-10-CM | POA: Diagnosis present

## 2021-06-24 DIAGNOSIS — M1990 Unspecified osteoarthritis, unspecified site: Secondary | ICD-10-CM | POA: Diagnosis not present

## 2021-06-24 DIAGNOSIS — Z955 Presence of coronary angioplasty implant and graft: Secondary | ICD-10-CM | POA: Diagnosis not present

## 2021-06-24 DIAGNOSIS — I350 Nonrheumatic aortic (valve) stenosis: Secondary | ICD-10-CM | POA: Diagnosis not present

## 2021-06-24 DIAGNOSIS — Z7982 Long term (current) use of aspirin: Secondary | ICD-10-CM

## 2021-06-24 DIAGNOSIS — I2510 Atherosclerotic heart disease of native coronary artery without angina pectoris: Secondary | ICD-10-CM | POA: Diagnosis not present

## 2021-06-24 DIAGNOSIS — Z01818 Encounter for other preprocedural examination: Secondary | ICD-10-CM

## 2021-06-24 DIAGNOSIS — Z79899 Other long term (current) drug therapy: Secondary | ICD-10-CM

## 2021-06-24 DIAGNOSIS — E785 Hyperlipidemia, unspecified: Secondary | ICD-10-CM | POA: Diagnosis not present

## 2021-06-24 DIAGNOSIS — M5416 Radiculopathy, lumbar region: Secondary | ICD-10-CM | POA: Diagnosis not present

## 2021-06-24 HISTORY — DX: Atherosclerosis of aorta: I70.0

## 2021-06-24 HISTORY — PX: ANTERIOR LATERAL LUMBAR FUSION WITH PERCUTANEOUS SCREW 1 LEVEL: SHX5553

## 2021-06-24 HISTORY — DX: Other forms of dyspnea: R06.09

## 2021-06-24 HISTORY — DX: Other ill-defined heart diseases: I51.89

## 2021-06-24 HISTORY — DX: Long term (current) use of antithrombotics/antiplatelets: Z79.02

## 2021-06-24 HISTORY — DX: Type 2 diabetes mellitus without complications: E11.9

## 2021-06-24 HISTORY — DX: Drug-induced myopathy: T46.6X5A

## 2021-06-24 HISTORY — PX: LUMBAR LAMINECTOMY/DECOMPRESSION MICRODISCECTOMY: SHX5026

## 2021-06-24 HISTORY — DX: Adverse effect of antihyperlipidemic and antiarteriosclerotic drugs, initial encounter: G72.0

## 2021-06-24 HISTORY — DX: Drug-induced myopathy: G72.0

## 2021-06-24 HISTORY — PX: APPLICATION OF INTRAOPERATIVE CT SCAN: SHX6668

## 2021-06-24 LAB — GLUCOSE, CAPILLARY
Glucose-Capillary: 161 mg/dL — ABNORMAL HIGH (ref 70–99)
Glucose-Capillary: 181 mg/dL — ABNORMAL HIGH (ref 70–99)

## 2021-06-24 SURGERY — ANTERIOR LATERAL LUMBAR FUSION WITH PERCUTANEOUS SCREW 1 LEVEL
Anesthesia: General | Site: Back

## 2021-06-24 MED ORDER — REMIFENTANIL HCL 1 MG IV SOLR
INTRAVENOUS | Status: AC
  Filled 2021-06-24: qty 1000

## 2021-06-24 MED ORDER — ONDANSETRON HCL 4 MG/2ML IJ SOLN
INTRAMUSCULAR | Status: DC | PRN
  Administered 2021-06-24 (×2): 4 mg via INTRAVENOUS

## 2021-06-24 MED ORDER — PHENYLEPHRINE HCL (PRESSORS) 10 MG/ML IV SOLN
INTRAVENOUS | Status: AC
  Filled 2021-06-24: qty 1

## 2021-06-24 MED ORDER — FENTANYL CITRATE (PF) 100 MCG/2ML IJ SOLN
INTRAMUSCULAR | Status: AC
  Filled 2021-06-24: qty 2

## 2021-06-24 MED ORDER — EPHEDRINE 5 MG/ML INJ
INTRAVENOUS | Status: AC
  Filled 2021-06-24: qty 5

## 2021-06-24 MED ORDER — CHLORHEXIDINE GLUCONATE 0.12 % MT SOLN
OROMUCOSAL | Status: AC
  Administered 2021-06-24: 15 mL via OROMUCOSAL
  Filled 2021-06-24: qty 15

## 2021-06-24 MED ORDER — SURGIFLO WITH THROMBIN (HEMOSTATIC MATRIX KIT) OPTIME
TOPICAL | Status: DC | PRN
  Administered 2021-06-24: 1 via TOPICAL

## 2021-06-24 MED ORDER — FENTANYL CITRATE (PF) 100 MCG/2ML IJ SOLN
INTRAMUSCULAR | Status: DC | PRN
  Administered 2021-06-24: 50 ug via INTRAVENOUS

## 2021-06-24 MED ORDER — DEXAMETHASONE SODIUM PHOSPHATE 10 MG/ML IJ SOLN
INTRAMUSCULAR | Status: DC | PRN
  Administered 2021-06-24: 10 mg via INTRAVENOUS

## 2021-06-24 MED ORDER — SUCCINYLCHOLINE CHLORIDE 200 MG/10ML IV SOSY
PREFILLED_SYRINGE | INTRAVENOUS | Status: DC | PRN
  Administered 2021-06-24: 120 mg via INTRAVENOUS

## 2021-06-24 MED ORDER — ONDANSETRON HCL 4 MG/2ML IJ SOLN: 4.0000 mg | Freq: Once | INTRAMUSCULAR | Status: DC | PRN

## 2021-06-24 MED ORDER — EPHEDRINE SULFATE (PRESSORS) 50 MG/ML IJ SOLN
INTRAMUSCULAR | Status: DC | PRN
  Administered 2021-06-24 (×7): 5 mg via INTRAVENOUS

## 2021-06-24 MED ORDER — OXYCODONE HCL 5 MG/5ML PO SOLN: 5.0000 mg | Freq: Once | ORAL | Status: DC | PRN

## 2021-06-24 MED ORDER — REMIFENTANIL HCL 1 MG IV SOLR
INTRAVENOUS | Status: DC | PRN
  Administered 2021-06-24: .1 ug/kg/min via INTRAVENOUS

## 2021-06-24 MED ORDER — PROPOFOL 10 MG/ML IV BOLUS
INTRAVENOUS | Status: AC
  Filled 2021-06-24: qty 20

## 2021-06-24 MED ORDER — PROPOFOL 1000 MG/100ML IV EMUL
INTRAVENOUS | Status: AC
  Filled 2021-06-24: qty 100

## 2021-06-24 MED ORDER — BUPIVACAINE-EPINEPHRINE 0.5% -1:200000 IJ SOLN
INTRAMUSCULAR | Status: DC | PRN
  Administered 2021-06-24: 3 mL
  Administered 2021-06-24: 9 mL

## 2021-06-24 MED ORDER — PHENYLEPHRINE 80 MCG/ML (10ML) SYRINGE FOR IV PUSH (FOR BLOOD PRESSURE SUPPORT)
PREFILLED_SYRINGE | INTRAVENOUS | Status: DC | PRN
  Administered 2021-06-24 (×3): 80 ug via INTRAVENOUS

## 2021-06-24 MED ORDER — PHENYLEPHRINE HCL (PRESSORS) 10 MG/ML IV SOLN
INTRAVENOUS | Status: AC
Start: 2021-06-24 — End: ?
  Filled 2021-06-24: qty 1

## 2021-06-24 MED ORDER — OXYCODONE HCL 5 MG PO TABS: 5.0000 mg | ORAL_TABLET | Freq: Once | ORAL | Status: DC | PRN

## 2021-06-24 MED ORDER — FAMOTIDINE 20 MG PO TABS
ORAL_TABLET | ORAL | Status: AC
  Administered 2021-06-24: 20 mg via ORAL
  Filled 2021-06-24: qty 1

## 2021-06-24 MED ORDER — SURGIPHOR WOUND IRRIGATION SYSTEM - OPTIME
TOPICAL | Status: DC | PRN
  Administered 2021-06-24: 450 mL

## 2021-06-24 MED ORDER — SODIUM CHLORIDE (PF) 0.9 % IJ SOLN
INTRAMUSCULAR | Status: DC | PRN
  Administered 2021-06-24: 60 mL

## 2021-06-24 MED ORDER — FENTANYL CITRATE (PF) 100 MCG/2ML IJ SOLN
INTRAMUSCULAR | Status: AC
  Administered 2021-06-24: 50 ug via INTRAVENOUS
  Filled 2021-06-24: qty 2

## 2021-06-24 MED ORDER — ACETAMINOPHEN 10 MG/ML IV SOLN
INTRAVENOUS | Status: AC
  Filled 2021-06-24: qty 100

## 2021-06-24 MED ORDER — ACETAMINOPHEN 10 MG/ML IV SOLN
INTRAVENOUS | Status: DC | PRN
  Administered 2021-06-24: 1000 mg via INTRAVENOUS

## 2021-06-24 MED ORDER — HYDROMORPHONE HCL 1 MG/ML IJ SOLN
0.5000 mg | INTRAMUSCULAR | Status: DC | PRN
  Administered 2021-06-24: 0.5 mg via INTRAVENOUS

## 2021-06-24 MED ORDER — BUPIVACAINE HCL (PF) 0.5 % IJ SOLN
INTRAMUSCULAR | Status: AC
Start: 2021-06-24 — End: ?
  Filled 2021-06-24: qty 30

## 2021-06-24 MED ORDER — CEFAZOLIN SODIUM-DEXTROSE 2-4 GM/100ML-% IV SOLN
INTRAVENOUS | Status: AC
  Filled 2021-06-24: qty 100

## 2021-06-24 MED ORDER — GLYCOPYRROLATE 0.2 MG/ML IJ SOLN
INTRAMUSCULAR | Status: DC | PRN
  Administered 2021-06-24: .2 mg via INTRAVENOUS

## 2021-06-24 MED ORDER — BUPIVACAINE LIPOSOME 1.3 % IJ SUSP
INTRAMUSCULAR | Status: AC
Start: 2021-06-24 — End: ?
  Filled 2021-06-24: qty 20

## 2021-06-24 MED ORDER — ACETAMINOPHEN 10 MG/ML IV SOLN: 1000.0000 mg | Freq: Once | INTRAVENOUS | Status: DC | PRN

## 2021-06-24 MED ORDER — HYDROMORPHONE HCL 1 MG/ML IJ SOLN
INTRAMUSCULAR | Status: AC
  Administered 2021-06-24: 0.5 mg via INTRAVENOUS
  Filled 2021-06-24: qty 1

## 2021-06-24 MED ORDER — OXYCODONE HCL 5 MG PO TABS
10.0000 mg | ORAL_TABLET | ORAL | Status: DC | PRN
  Administered 2021-06-24 – 2021-06-25 (×3): 10 mg via ORAL
  Filled 2021-06-24 (×3): qty 2

## 2021-06-24 MED ORDER — 0.9 % SODIUM CHLORIDE (POUR BTL) OPTIME
TOPICAL | Status: DC | PRN
  Administered 2021-06-24: 1000 mL

## 2021-06-24 MED ORDER — FENTANYL CITRATE (PF) 100 MCG/2ML IJ SOLN
25.0000 ug | INTRAMUSCULAR | Status: DC | PRN
  Administered 2021-06-24: 50 ug via INTRAVENOUS

## 2021-06-24 MED ORDER — PROPOFOL 10 MG/ML IV BOLUS
INTRAVENOUS | Status: DC | PRN
  Administered 2021-06-24: 40 mg via INTRAVENOUS

## 2021-06-24 MED ORDER — BUPIVACAINE-EPINEPHRINE (PF) 0.5% -1:200000 IJ SOLN
INTRAMUSCULAR | Status: AC
  Filled 2021-06-24: qty 30

## 2021-06-24 MED ORDER — OXYCODONE HCL 5 MG PO TABS
ORAL_TABLET | ORAL | Status: AC
  Filled 2021-06-24: qty 2

## 2021-06-24 MED ORDER — METHOCARBAMOL 1000 MG/10ML IJ SOLN
500.0000 mg | Freq: Four times a day (QID) | INTRAVENOUS | Status: DC | PRN
  Administered 2021-06-24: 500 mg via INTRAVENOUS
  Filled 2021-06-24: qty 500

## 2021-06-24 MED ORDER — SODIUM CHLORIDE FLUSH 0.9 % IV SOLN
INTRAVENOUS | Status: AC
  Filled 2021-06-24: qty 20

## 2021-06-24 MED ORDER — METHOCARBAMOL 500 MG PO TABS
500.0000 mg | ORAL_TABLET | Freq: Four times a day (QID) | ORAL | Status: DC | PRN
  Administered 2021-06-25: 500 mg via ORAL
  Filled 2021-06-24: qty 1

## 2021-06-24 MED ORDER — LIDOCAINE HCL (CARDIAC) PF 100 MG/5ML IV SOSY
PREFILLED_SYRINGE | INTRAVENOUS | Status: DC | PRN
  Administered 2021-06-24: 100 mg via INTRAVENOUS

## 2021-06-24 MED ORDER — PHENYLEPHRINE HCL-NACL 20-0.9 MG/250ML-% IV SOLN
INTRAVENOUS | Status: DC | PRN
  Administered 2021-06-24: 25 ug/min via INTRAVENOUS

## 2021-06-24 SURGICAL SUPPLY — 92 items
ALLOGRAFT BONESTRIP KORE 2.5X5 (Bone Implant) ×1 IMPLANT
BASIN KIT SINGLE STR (MISCELLANEOUS) ×3 IMPLANT
BUR NEURO DRILL SOFT 3.0X3.8M (BURR) ×1 IMPLANT
CAP LOCKING FOR SCREW CREO (Cap) ×4 IMPLANT
CHLORAPREP W/TINT 26 (MISCELLANEOUS) ×12 IMPLANT
CORD BIP STRL DISP 12FT (MISCELLANEOUS) ×3 IMPLANT
CORD LIGHT LATERIAL X LIFT (MISCELLANEOUS) ×1 IMPLANT
COUNTER NEEDLE 20/40 LG (NEEDLE) ×6 IMPLANT
COVER BACK TABLE REUSABLE LG (DRAPES) ×3 IMPLANT
CUP MEDICINE 2OZ PLAST GRAD ST (MISCELLANEOUS) ×3 IMPLANT
DERMABOND ADVANCED (GAUZE/BANDAGES/DRESSINGS) ×3
DERMABOND ADVANCED .7 DNX12 (GAUZE/BANDAGES/DRESSINGS) ×6 IMPLANT
DRAPE 3D C-ARM OEC (DRAPES) IMPLANT
DRAPE C ARM PK CFD 31 SPINE (DRAPES) ×3 IMPLANT
DRAPE C-ARMOR (DRAPES) ×1 IMPLANT
DRAPE INCISE IOBAN 66X45 STRL (DRAPES) ×3 IMPLANT
DRAPE LAPAROTOMY 100X77 ABD (DRAPES) ×6 IMPLANT
DRAPE MICROSCOPE SPINE 48X150 (DRAPES) ×1 IMPLANT
DRAPE SCAN PATIENT (DRAPES) ×3 IMPLANT
DRAPE SURG 17X11 SM STRL (DRAPES) ×6 IMPLANT
DRSG OPSITE POSTOP 4X6 (GAUZE/BANDAGES/DRESSINGS) ×2 IMPLANT
DRSG OPSITE POSTOP 4X8 (GAUZE/BANDAGES/DRESSINGS) ×1 IMPLANT
DRSG TEGADERM 2-3/8X2-3/4 SM (GAUZE/BANDAGES/DRESSINGS) IMPLANT
DRSG TEGADERM 4X4.75 (GAUZE/BANDAGES/DRESSINGS) IMPLANT
DRSG TEGADERM 6X8 (GAUZE/BANDAGES/DRESSINGS) IMPLANT
ELECT CAUTERY BLADE TIP 2.5 (TIP) ×3
ELECT EZSTD 165MM 6.5IN (MISCELLANEOUS) ×3
ELECT REM PT RETURN 9FT ADLT (ELECTROSURGICAL) ×6
ELECTRODE CAUTERY BLDE TIP 2.5 (TIP) ×2 IMPLANT
ELECTRODE EZSTD 165MM 6.5IN (MISCELLANEOUS) ×2 IMPLANT
ELECTRODE REM PT RTRN 9FT ADLT (ELECTROSURGICAL) ×4 IMPLANT
EX-PIN ORTHOLOCK NAV 4X150 (PIN) ×1 IMPLANT
FEE INTRAOP CADWELL SUPPLY NCS (MISCELLANEOUS) ×2 IMPLANT
FEE INTRAOP MONITOR IMPULS NCS (MISCELLANEOUS) ×2 IMPLANT
FORCEPS BPLR BAYO 10IN 1.0TIP (ORTHOPEDIC DISPOSABLE SUPPLIES) ×1 IMPLANT
GAUZE 4X4 16PLY ~~LOC~~+RFID DBL (SPONGE) ×3 IMPLANT
GLOVE BIOGEL PI IND STRL 6.5 (GLOVE) ×6 IMPLANT
GLOVE BIOGEL PI INDICATOR 6.5 (GLOVE) ×3
GLOVE SURG SYN 6.5 ES PF (GLOVE) ×15 IMPLANT
GLOVE SURG SYN 6.5 PF PI (GLOVE) ×10 IMPLANT
GLOVE SURG SYN 8.5  E (GLOVE) ×6
GLOVE SURG SYN 8.5 E (GLOVE) ×12 IMPLANT
GLOVE SURG SYN 8.5 PF PI (GLOVE) ×12 IMPLANT
GOWN SRG LRG LVL 4 IMPRV REINF (GOWNS) ×4 IMPLANT
GOWN SRG XL LVL 3 NONREINFORCE (GOWNS) ×4 IMPLANT
GOWN STRL NON-REIN TWL XL LVL3 (GOWNS) ×2
GOWN STRL REIN LRG LVL4 (GOWNS) ×2
GRADUATE 1200CC STRL 31836 (MISCELLANEOUS) ×3 IMPLANT
GRAFT BONE PROTEIOS LRG 5CC (Orthopedic Implant) ×1 IMPLANT
HOLDER FOLEY CATH W/STRAP (MISCELLANEOUS) ×1 IMPLANT
INTRAOP CADWELL SUPPLY FEE NCS (MISCELLANEOUS) ×2
INTRAOP DISP SUPPLY FEE NCS (MISCELLANEOUS) ×1
INTRAOP MONITOR FEE IMPULS NCS (MISCELLANEOUS) ×2
INTRAOP MONITOR FEE IMPULSE (MISCELLANEOUS) ×1
K-WIRE 1.6 NITINOL SHARP TIP (WIRE) ×12
KIT DISP MARS 3V (KITS) ×1 IMPLANT
KIT PEDICLE ACCESS (KITS) ×1 IMPLANT
KIT SPINAL PRONEVIEW (KITS) ×3 IMPLANT
KIT TURNOVER KIT A (KITS) ×3 IMPLANT
KNIFE BAYONET SHORT DISCETOMY (MISCELLANEOUS) IMPLANT
KWIRE 1.6 NITINOL SHARP TIP (WIRE) IMPLANT
MANIFOLD NEPTUNE II (INSTRUMENTS) ×6 IMPLANT
MARKER SKIN DUAL TIP RULER LAB (MISCELLANEOUS) ×9 IMPLANT
MARKER SPHERE PSV REFLC 13MM (MARKER) ×21 IMPLANT
NDL SAFETY ECLIPSE 18X1.5 (NEEDLE) ×2 IMPLANT
NEEDLE HYPO 18GX1.5 SHARP (NEEDLE) ×2
NEEDLE HYPO 22GX1.5 SAFETY (NEEDLE) ×3 IMPLANT
PACK LAMINECTOMY NEURO (CUSTOM PROCEDURE TRAY) ×3 IMPLANT
PAD ARMBOARD 7.5X6 YLW CONV (MISCELLANEOUS) ×3 IMPLANT
PENCIL ELECTRO HAND CTR (MISCELLANEOUS) ×3 IMPLANT
ROD SPINAL CREO AMP 5.5X60 CAN (Rod) ×1 IMPLANT
ROD SPINE CURVE 5.5X55 (Rod) ×1 IMPLANT
SCREW CANN SPINE 6.5X45 (Screw) ×4 IMPLANT
SCREW CREO FENESTRATED 5.5X30 (Screw) ×4 IMPLANT
SOLUTION IRRIG SURGIPHOR (IV SOLUTION) ×3 IMPLANT
SPACER HEDRON 18X55X13 10D (Spacer) ×1 IMPLANT
SPONGE GAUZE 2X2 8PLY STRL LF (GAUZE/BANDAGES/DRESSINGS) IMPLANT
STAPLER SKIN PROX 35W (STAPLE) IMPLANT
SURGIFLO W/THROMBIN 8M KIT (HEMOSTASIS) ×3 IMPLANT
SUT DVC VLOC 3-0 CL 6 P-12 (SUTURE) ×6 IMPLANT
SUT ETHILON 3-0 FS-10 30 BLK (SUTURE)
SUT VIC AB 0 CT1 27 (SUTURE) ×3
SUT VIC AB 0 CT1 27XCR 8 STRN (SUTURE) ×2 IMPLANT
SUT VIC AB 2-0 CT1 18 (SUTURE) ×5 IMPLANT
SUTURE EHLN 3-0 FS-10 30 BLK (SUTURE) IMPLANT
SYR 30ML LL (SYRINGE) ×6 IMPLANT
TOWEL OR 17X26 4PK STRL BLUE (TOWEL DISPOSABLE) ×15 IMPLANT
TRAY FOLEY MTR SLVR 16FR STAT (SET/KITS/TRAYS/PACK) ×1 IMPLANT
TROCAR INSERT W/PEDICLE NDL (TROCAR) IMPLANT
TROCAR INSERT W/PEDICLE NDLE (TROCAR)
TUBING CONNECTING 10 (TUBING) ×6 IMPLANT
WATER STERILE IRR 500ML POUR (IV SOLUTION) ×1 IMPLANT

## 2021-06-24 NOTE — Anesthesia Postprocedure Evaluation (Signed)
Anesthesia Post Note  Patient: Joshua Ferrell.  Procedure(s) Performed: L4-5 LATERAL LUMBAR INTERBODY FUSION AND POSTERIOR SPINAL FUSION ([place]) LEFT L5-S1 LAMINOFORAMINOTOMY (Back) APPLICATION OF INTRAOPERATIVE CT SCAN  Patient location during evaluation: PACU Anesthesia Type: General Level of consciousness: awake and alert Pain management: pain level controlled Vital Signs Assessment: post-procedure vital signs reviewed and stable Respiratory status: spontaneous breathing, nonlabored ventilation, respiratory function stable and patient connected to nasal cannula oxygen Cardiovascular status: blood pressure returned to baseline and stable Postop Assessment: no apparent nausea or vomiting Anesthetic complications: no   No notable events documented.   Last Vitals:  Vitals:   06/24/21 1915 06/24/21 1930  BP: 135/63 (!) 148/78  Pulse: 78 75  Resp: 18 15  Temp: 36.6 C 36.5 C  SpO2: 100% 100%    Last Pain:  Vitals:   06/24/21 1930  TempSrc:   PainSc: 5                  Precious Haws Nelsie Domino

## 2021-06-24 NOTE — Op Note (Signed)
Indications: Mr. Anastacio is a 76 yo male who presented with: M43.10-Anterolisthesis , M54.16-Lumbar Radiculopathy  He failed conservative management prompting surgical intervention. Findings: correction of anterolisthesis  Preoperative Diagnosis: M43.10-Anterolisthesis , M45.16-Lumbar Radiculopathy Postoperative Diagnosis: same   EBL: 100 ml IVF: see AR ml Drains: none Disposition: Extubated and Stable to PACU Complications: none  A foley catheter was placed.   Preoperative Note:   Risks of surgery discussed include: infection, bleeding, stroke, coma, death, paralysis, CSF leak, nerve/spinal cord injury, numbness, tingling, weakness, complex regional pain syndrome, recurrent stenosis and/or disc herniation, vascular injury, development of instability, neck/back pain, need for further surgery, persistent symptoms, development of deformity, and the risks of anesthesia. The patient understood these risks and agreed to proceed.  NAME OF ANTERIOR PROCEDURE:               1. Anterior lumbar interbody fusion via a left lateral retroperitoneal approach at L4/5 2. Placement of a Lordotic  Globus Hedron interbody cage, filled with Demineralized Bone Matrix    NAME OF POSTERIOR PROCEDURE: 1. Posterior instrumentation using Globus Creo Instrumentation 2. Posterolateral fusion, L4-5  3. Posterior decompression, L5-S1    PROCEDURE:  Patient was brought to the operating room, intubated, turned to the lateral position.  All pressure points were checked and double-checked.  The patient was prepped and draped in the standard fashion. Prior to prepping, fluoroscopy was brought in and the patient was positioned with a large bump under the contralateral side between the iliac crest and rib cage, allowing the area between the iliac crest and the lateral aspect of the rib cage to open and increase the ability to reach inferiorly, to facilitate entry into the disc space.  The incision was marked upon the  skin both the location of the disc space as well as the superior most aspect of the iliac crest.  Based on the identification of the disc space an incision was prepared, marked upon the skin and eventually was used for our lateral incision.  The fluoroscopy was turned into a cross table A/P image in order to confirm that the patient's spine remained in a perpendicular trajectory to the floor without rotation.  Once confirming that all the pressure points were checked and double-checked and the patient remained in sturdy position strapped down in this slightly jack-knifed lateral position, the patient was prepped and draped in standard fashion.  The skin was injected with local anesthetic, then incised until the abdominal wall fascia was noted.  I bluntly dissected posteriorly until we were able to identify the posterior musculature near petit's triangle.  At this point, using primarily blunt dissection with our finger aided with a metzenbaum scissor, were able to enter the retroperitoneal cavity.  The retroperitoneal potential space was opened further until palpating out the psoas muscle, the medial aspect of the iliac crest, the medial aspect of the last rib and continued to define the retroperitoneal space with blunt dissection in order to facilitate safe placement of our dilators.    While protecting by dissecting directly onto a finger in the retroperitoneum, the retroperitoneal space was entered safely from the lateral incision and the initial dilator placed onto the muscle belly of the psoas.  While directly stimulating the dilator and after radiographically confirming our location relative to the disc space, I placed the dilator through the psoas.  The dilators were stimulated to ensure remaining safely away from any of the lumbar plexus nerves; the dilators were repositioned until no pathologic stimulation was appreciated.  Once I had confirmed the location of our initial dilator radiographically, a  K-wire secured the dilator into the L4/5 disc space and confirmed position under A/P and lateral fluoroscopy.  At this point, I dilated up with direct stimulation to confirm lack of pathologic stimulation.  Once all the dilators were in position, I placed in the retractor and secured it onto the table, locked into position and confirmed under A/P and lateral fluoroscopy to confirm our approach angle to the disc space as well as location relative to the disc space.  I then placed the muscle stimulator in through the working channel down to the vertebral body, stimulating the entire lateral surface of the vertebral body and any of the visualized psoas muscle that was adjacent to the retractor, confirming again the safe passage to the psoas before we began performing the discectomy.  At this point, we began our discectomy at L4/5.  The disc was incised laterally throughout the extent of our exposure. Using a combination of pituitary rongeurs, Kerrison rongeurs, rasps, curettes of various sorts, we were able to begin to clean out the disc space.  Once we had cleaned out the majority of the disc space, we then cut the lateral annulus with a cob, breaking the lateral annual attachments on the contralateral side by subtly working the cob through the annulus while using flouroscopy.  Care was taken not to extend further than required after cutting the annular attachments.  After this had been performed, we prepared the endplates for placement of our graft, sized a graft to the disc space by serially dilating up in trial sizes until we confirmed that our graft would be well positioned, allowing distraction while maintaining good grip.  This was confirmed under A/P and lateral fluoroscopy in order to ensure its placement as an eventual trial for placement of our final graft.  We irrigated with bacteriostatic saline.  Once confirmed placement, the Hedron implant filled with allograft was impacted into position at  L4/5.   Through a combination of intradiscal distraction and anterior releasing, we were able to correct the anterior deformity during disc preparation and placement of the graft.  At this point, final radiographs were performed, and we began closure.  The wound was closed using 0 Vicryl interrupted suture in the fascia and 2-0 Vicryl inverted suture were placed in the subcutaneous tissue and dermis. 3-0 monocryl was used for final closure. Dermabond was used to close the skin.    After closing the anterior part in layers, the patient was repositioned into prone position.  All pressure points were checked and double-checked.  The posterior operative site was prepped and draped in standard fashion.  The stereotactic array was placed.  Stereotactic images were acquired using intraoperative CT scanning.  This was registered to the patient.  Using stereotaxis, screw trajectories were planned and incisions made.  The pedicles from L4 to L5 were cannulated bilaterally and K wires used to secure the tracks.  We then utilized a stereotactic screwdriver to place pedicle screws from L4 to L5.  At each level, 6.5x23m Globus Creo pedicle screws were placed.  Once the screws were placed, the screw extensions were then linked, a path was formed for the rod and a rod was utilized to connect the screws.  We then compressed, torqued / counter-torqued and removed the screw assembly. Once performed on each side, the C-arm was brought back and to take confirmatory CT scan showing appropriate placement of all instrumentation and anatomic alignment.  Posterolateral arthrodesis was performed at L4-L5.  The stereotactic system was then used to choose an incision for L5-S1 decompression. After performing the fusion at L4-5, the metrx tubes were sequentially advanced and confirmed in position at L5-S1. An 85m by 629mtube was placed over the left L5-S1 facet.  The microscope was then sterilely brought into the field and  muscle creep was hemostased with a bipolar and resected with a pituitary rongeur.  A Bovie extender was then used to expose the spinous process and lamina.  Careful attention was placed to not violate the facet capsule. A 3 mm matchstick drill bit was then used to make a hemi-laminotomy trough until the ligamentum flavum was exposed.  This was extended to the base of the spinous process and to the contralateral side to remove all the central bone from each side.  Once this was complete and the underlying ligamentum flavum was visualized, it was dissected with a curette and resected with Kerrison rongeurs.  Extensive ligamentum hypertrophy was noted, requiring a substantial amount of time and care for removal.  The dura was identified and palpated. The kerrison rongeur was then used to remove the medial facet bilaterally until no compression was noted.  A balltip probe was used to confirm decompression of the ipsilateral S1 nerve root.  No CSF leak was noted.  The wound was copiously irrigated. The tube system was then removed under microscopic visualization and hemostasis was obtained with a bipolar.    Each wound was closed using 0 Vicryl interrupted suture in the fascia, 2-0 Vicryl inverted suture were placed in the subcutaneous tissue and dermis. 3-0 monocryl was used for final closure. Dermabond was used to close the skin.    Needle, lap and all counts were correct at the end of the case.    There was no pathologic change in the neuromonitoring during the procedure.   DaCooper RenderA assisted in the entire procedure.  ChMeade MawD Neurosurgery

## 2021-06-24 NOTE — Anesthesia Procedure Notes (Signed)
Procedure Name: Intubation Date/Time: 06/24/2021 3:05 PM Performed by: Kelton Pillar, CRNA Pre-anesthesia Checklist: Patient identified, Emergency Drugs available, Suction available and Patient being monitored Patient Re-evaluated:Patient Re-evaluated prior to induction Oxygen Delivery Method: Circle system utilized Preoxygenation: Pre-oxygenation with 100% oxygen Induction Type: IV induction Ventilation: Mask ventilation without difficulty Laryngoscope Size: McGraph and 4 Grade View: Grade I Tube type: Oral Tube size: 7.0 mm Number of attempts: 1 Airway Equipment and Method: Stylet and Oral airway Placement Confirmation: ETT inserted through vocal cords under direct vision, positive ETCO2, breath sounds checked- equal and bilateral and CO2 detector Secured at: 21 cm Tube secured with: Tape Dental Injury: Teeth and Oropharynx as per pre-operative assessment

## 2021-06-24 NOTE — H&P (Signed)
I have reviewed and confirmed my history and physical from 05/26/2021 with no additions or changes. Plan for L4-5 XLIF/PSF and L L5/S1 decompression.  Risks and benefits reviewed.  Heart sounds normal no MRG. Chest Clear to Auscultation Bilaterally.

## 2021-06-24 NOTE — Progress Notes (Signed)
Patient is awake/alert x4. Moving x4 ext, states left lower ext "feels so much better"  tolerated gingerale, crackers  without event.

## 2021-06-24 NOTE — Transfer of Care (Signed)
Immediate Anesthesia Transfer of Care Note  Patient: Joshua Ferrell.  Procedure(s) Performed: L4-5 LATERAL LUMBAR INTERBODY FUSION AND POSTERIOR SPINAL FUSION (Back) LEFT L5-S1 LAMINOFORAMINOTOMY (Back) APPLICATION OF INTRAOPERATIVE CT SCAN  Patient Location: PACU  Anesthesia Type:General  Level of Consciousness: awake, drowsy and patient cooperative  Airway & Oxygen Therapy: Patient Spontanous Breathing and Patient connected to face mask oxygen  Post-op Assessment: Report given to RN and Post -op Vital signs reviewed and stable  Post vital signs: Reviewed and stable  Last Vitals:  Vitals Value Taken Time  BP 140/94 06/24/21 1831  Temp    Pulse 85 06/24/21 1835  Resp 21 06/24/21 1835  SpO2 100 % 06/24/21 1835  Vitals shown include unvalidated device data.  Last Pain:  Vitals:   06/24/21 1258  TempSrc: Temporal      Patients Stated Pain Goal: 0 (92/23/00 9794)  Complications: No notable events documented.

## 2021-06-24 NOTE — Anesthesia Preprocedure Evaluation (Addendum)
Anesthesia Evaluation  Patient identified by MRN, date of birth, ID band Patient awake    Reviewed: Allergy & Precautions, NPO status , Patient's Chart, lab work & pertinent test results  History of Anesthesia Complications (+) PONV and history of anesthetic complications  Airway Mallampati: II  TM Distance: >3 FB Neck ROM: Full    Dental  (+) Missing, Poor Dentition, Chipped   Pulmonary neg pulmonary ROS, neg sleep apnea, neg COPD, Patient abstained from smoking.Not current smoker,    Pulmonary exam normal breath sounds clear to auscultation       Cardiovascular Exercise Tolerance: Good METShypertension, + CAD, + Past MI, + Cardiac Stents and +CHF  (-) dysrhythmias  Rhythm:Regular Rate:Normal - Systolic murmurs ? Patient suffered an inferior wall STEMI on 06/24/2000.  Diagnostic left heart catheterization was performed with subsequent PCI of the mid RCA placing a single stent (type unknown).  ? Patient suffered an NSTEMI on 06/12/2020.  Diagnostic left heart catheterization performed revealing a mildly reduced left ventricular systolic function with an EF of 45-50%.  There was multivessel CAD; 100% ostial RCA, 95% ostial to proximal LAD, 70% 2nd LPL, 70% 3rd LPL, 30% proximal LCx, and 50% mid LAD.  PCI performed placing a 2.75 x 15 mm resolute Onyx DES to the ostial to proximal LAD lesion yielding excellent angiographic result and TIMI-3 flow.  ? Last TTE was performed on 06/13/2020 revealing a mildly decreased left ventricular systolic function with an EF of 45-50%. Diastolic Doppler parameters consistent with abnormal relaxation (G1DD).  There was mild mitral valve regurgitation noted.  Additionally, there was mild aortic valve stenosis (mean gradient 5.0 mmHg.  Cardiologist has deemed his acceptable risk for this procedure   Neuro/Psych  Headaches,  Neuromuscular disease negative psych ROS   GI/Hepatic GERD  ,(+)     (-)  substance abuse  ,   Endo/Other  diabetes, Well Controlled, Oral Hypoglycemic Agents  Renal/GU negative Renal ROS     Musculoskeletal  (+) Arthritis ,   Abdominal   Peds  Hematology   Anesthesia Other Findings Past Medical History: No date: Aortic atherosclerosis ([place]) 06/13/2020: Aortic stenosis     Comment:  a.)  TTE 06/13/2020: EF 45-50%; mild AS with MPG 5 mmHg;              AVA (VTI) = 1.94 cm. No date: Arthritis No date: Coronary artery disease     Comment:  a.) Inferior STEMI 06/24/2000 -> PCI placing stent               (unknown type) to mRCA. b.) NSTEMI 06/12/2020 --> LHC               06/12/2020: 100% oLAD, 95% o-pLAD, 70% 2nd LPL, 70% 3rd               LPL, 30% pLCx, 50% mLAD --> PCI placing a 2.75 x 15 mm               Resolute Onyx DES to o-pLAD. No date: Diastolic dysfunction     Comment:  a.)  TTE 06/13/2020: EF 45-50%; G1DD. No date: DOE (dyspnea on exertion) No date: GERD (gastroesophageal reflux disease) No date: Headache(784.0) 12/31/2018: History of 2019 novel coronavirus disease (COVID-19) 1990: History of blood transfusion     Comment:  internal injuries after a fall requiring blood No date: History of colonic polyps No date: Hyperlipidemia No date: Hypertension No date: Long term current use of antithrombotics/antiplatelets     Comment:  a.) clopidogrel 06/12/2020: NSTEMI (non-ST elevated myocardial infarction) [facility] [address])     Comment:  a.) LHC 06/12/2020: 100% oLAD, 95% o-pLAD, 70% 2nd LPL,               70% 3rd LPL, 30% pLCx, 50% mLAD --> PCI placing a 2.75 x               15 mm Resolute Onyx DES to o-pLAD. No date: PONV (postoperative nausea and vomiting)     Comment:  one time only (surgery in 1990) 06/24/2000: ST elevation myocardial infarction (STEMI) of inferior  wall ([facility])     Comment:  a.) PCI 06/24/2000 placing stent (unknown type) to mRCA No date: Statin myopathy No date: T2DM (type 2 diabetes mellitus) ([place])   Reproductive/Obstetrics                            Anesthesia Physical Anesthesia Plan  ASA: 3  Anesthesia Plan: General   Post-op Pain Management: Ofirmev IV (intra-op)*   Induction: Intravenous  PONV Risk Score and Plan: 4 or greater and Ondansetron, Dexamethasone, Treatment may vary due to age or medical condition, Propofol infusion and TIVA  Airway Management Planned: Oral ETT  Additional Equipment: None  Intra-op Plan:   Post-operative Plan: Extubation in OR  Informed Consent: I have reviewed the patients History and Physical, chart, labs and discussed the procedure including the risks, benefits and alternatives for the proposed anesthesia with the patient or authorized representative who has indicated his/her understanding and acceptance.     Dental advisory given  Plan Discussed with: CRNA and Surgeon  Anesthesia Plan Comments: (Discussed risks of anesthesia with patient, including PONV, sore throat, lip/dental/eye damage. Rare risks discussed as well, such as cardiorespiratory and neurological sequelae, and allergic reactions. Discussed the role of CRNA in patient's perioperative care. Patient understands.)        Anesthesia Quick Evaluation

## 2021-06-25 LAB — CBC
HCT: 38.4 % — ABNORMAL LOW (ref 39.0–52.0)
Hemoglobin: 13 g/dL (ref 13.0–17.0)
MCH: 31.9 pg (ref 26.0–34.0)
MCHC: 33.9 g/dL (ref 30.0–36.0)
MCV: 94.1 fL (ref 80.0–100.0)
Platelets: 148 10*3/uL — ABNORMAL LOW (ref 150–400)
RBC: 4.08 MIL/uL — ABNORMAL LOW (ref 4.22–5.81)
RDW: 12.6 % (ref 11.5–15.5)
WBC: 11.3 10*3/uL — ABNORMAL HIGH (ref 4.0–10.5)
nRBC: 0 % (ref 0.0–0.2)

## 2021-06-25 LAB — GLUCOSE, CAPILLARY
Glucose-Capillary: 237 mg/dL — ABNORMAL HIGH (ref 70–99)
Glucose-Capillary: 263 mg/dL — ABNORMAL HIGH (ref 70–99)
Glucose-Capillary: 179 mg/dL — ABNORMAL HIGH (ref 70–99)
Glucose-Capillary: 218 mg/dL — ABNORMAL HIGH (ref 70–99)

## 2021-06-25 LAB — HEMOGLOBIN A1C
Hgb A1c MFr Bld: 6.6 % — ABNORMAL HIGH (ref 4.8–5.6)
Mean Plasma Glucose: 142.72 mg/dL

## 2021-06-25 LAB — CREATININE, SERUM
Creatinine, Ser: 1.1 mg/dL (ref 0.61–1.24)
GFR, Estimated: >60 mL/min (ref >60)

## 2021-06-25 MED ORDER — MENTHOL 3 MG MT LOZG: 1.0000 | LOZENGE | OROMUCOSAL | Status: DC | PRN

## 2021-06-25 MED ORDER — FLEET ENEMA 7-19 GM/118ML RE ENEM: 1.0000 | ENEMA | Freq: Once | RECTAL | Status: DC | PRN

## 2021-06-25 MED ORDER — HYDROCHLOROTHIAZIDE 12.5 MG PO TABS
12.5000 mg | ORAL_TABLET | Freq: Every day | ORAL | Status: DC
  Administered 2021-06-25 – 2021-06-26 (×2): 12.5 mg via ORAL
  Filled 2021-06-25 (×2): qty 1

## 2021-06-25 MED ORDER — AMLODIPINE BESYLATE 5 MG PO TABS
5.0000 mg | ORAL_TABLET | ORAL | Status: DC
  Administered 2021-06-25 – 2021-06-26 (×2): 5 mg via ORAL
  Filled 2021-06-25 (×2): qty 1

## 2021-06-25 MED ORDER — ACETAMINOPHEN 500 MG PO TABS
1000.0000 mg | ORAL_TABLET | Freq: Four times a day (QID) | ORAL | Status: AC
  Administered 2021-06-25 (×4): 1000 mg via ORAL
  Filled 2021-06-25 (×4): qty 2

## 2021-06-25 MED ORDER — PHENOL 1.4 % MT LIQD: 1.0000 | OROMUCOSAL | Status: DC | PRN

## 2021-06-25 MED ORDER — VALSARTAN-HYDROCHLOROTHIAZIDE 160-12.5 MG PO TABS: 1.0000 | ORAL_TABLET | ORAL | Status: DC

## 2021-06-25 MED ORDER — KETOROLAC TROMETHAMINE 15 MG/ML IJ SOLN
7.5000 mg | Freq: Four times a day (QID) | INTRAMUSCULAR | Status: AC
  Administered 2021-06-25 (×3): 7.5 mg via INTRAVENOUS
  Filled 2021-06-25 (×3): qty 1

## 2021-06-25 MED ORDER — SODIUM CHLORIDE 0.9% FLUSH
3.0000 mL | Freq: Two times a day (BID) | INTRAVENOUS | Status: DC
  Administered 2021-06-25 (×3): 3 mL via INTRAVENOUS

## 2021-06-25 MED ORDER — NITROGLYCERIN 0.4 MG SL SUBL
0.4000 mg | SUBLINGUAL_TABLET | SUBLINGUAL | Status: DC | PRN
Start: 2021-06-25 — End: 2021-06-26

## 2021-06-25 MED ORDER — ONDANSETRON HCL 4 MG PO TABS: 4.0000 mg | ORAL_TABLET | Freq: Four times a day (QID) | ORAL | Status: DC | PRN

## 2021-06-25 MED ORDER — INSULIN ASPART 100 UNIT/ML IJ SOLN
0.0000 [IU] | Freq: Three times a day (TID) | INTRAMUSCULAR | Status: DC
  Administered 2021-06-25: 5 [IU] via SUBCUTANEOUS
  Administered 2021-06-25: 3 [IU] via SUBCUTANEOUS
  Administered 2021-06-25: 8 [IU] via SUBCUTANEOUS
  Administered 2021-06-26: 3 [IU] via SUBCUTANEOUS
  Filled 2021-06-25 (×4): qty 1

## 2021-06-25 MED ORDER — DOCUSATE SODIUM 100 MG PO CAPS
100.0000 mg | ORAL_CAPSULE | Freq: Two times a day (BID) | ORAL | Status: DC
  Administered 2021-06-25 – 2021-06-26 (×4): 100 mg via ORAL
  Filled 2021-06-25 (×4): qty 1

## 2021-06-25 MED ORDER — SENNA 8.6 MG PO TABS
1.0000 | ORAL_TABLET | Freq: Two times a day (BID) | ORAL | Status: DC
  Administered 2021-06-25 – 2021-06-26 (×4): 8.6 mg via ORAL
  Filled 2021-06-25 (×4): qty 1

## 2021-06-25 MED ORDER — MORPHINE SULFATE (PF) 2 MG/ML IV SOLN: 2.0000 mg | INTRAVENOUS | Status: AC | PRN

## 2021-06-25 MED ORDER — ONDANSETRON HCL 4 MG/2ML IJ SOLN: 4.0000 mg | Freq: Four times a day (QID) | INTRAMUSCULAR | Status: DC | PRN

## 2021-06-25 MED ORDER — IRBESARTAN 150 MG PO TABS
150.0000 mg | ORAL_TABLET | Freq: Every day | ORAL | Status: DC
  Administered 2021-06-25 – 2021-06-26 (×2): 150 mg via ORAL
  Filled 2021-06-25 (×2): qty 1

## 2021-06-25 MED ORDER — BISACODYL 10 MG RE SUPP: 10.0000 mg | Freq: Every day | RECTAL | Status: DC | PRN

## 2021-06-25 MED ORDER — ENOXAPARIN SODIUM 40 MG/0.4ML IJ SOSY
40.0000 mg | PREFILLED_SYRINGE | INTRAMUSCULAR | Status: DC
  Administered 2021-06-25 – 2021-06-26 (×2): 40 mg via SUBCUTANEOUS
  Filled 2021-06-25 (×2): qty 0.4

## 2021-06-25 MED ORDER — INSULIN ASPART 100 UNIT/ML IJ SOLN
0.0000 [IU] | Freq: Every day | INTRAMUSCULAR | Status: DC
  Administered 2021-06-25: 2 [IU] via SUBCUTANEOUS
  Filled 2021-06-25: qty 1

## 2021-06-25 MED ORDER — SODIUM CHLORIDE 0.9% FLUSH: 3.0000 mL | INTRAVENOUS | Status: DC | PRN

## 2021-06-25 MED ORDER — POLYETHYLENE GLYCOL 3350 17 G PO PACK
17.0000 g | PACK | Freq: Every day | ORAL | Status: DC | PRN
Start: 2021-06-25 — End: 2021-06-26
  Filled 2021-06-25: qty 1

## 2021-06-25 MED ORDER — OXYCODONE HCL 5 MG PO TABS: 5.0000 mg | ORAL_TABLET | ORAL | Status: DC | PRN

## 2021-06-25 MED ORDER — SODIUM CHLORIDE 0.9 % IV SOLN: INTRAVENOUS | Status: DC

## 2021-06-25 MED ORDER — METOPROLOL SUCCINATE ER 50 MG PO TB24
50.0000 mg | ORAL_TABLET | ORAL | Status: DC
  Administered 2021-06-25 – 2021-06-26 (×2): 50 mg via ORAL
  Filled 2021-06-25 (×2): qty 1

## 2021-06-25 NOTE — Discharge Instructions (Addendum)
NEUROSURGERY DISCHARGE INSTRUCTIONS  Admission diagnosis: S/P lumbar fusion [Z98.1]  Operative procedure:  L4-5 XLIF. L5-S1 decompression   What to do after you leave the hospital:  Recommended diet: regular diet. Increase protein intake to promote wound healing.  Recommended activity: no lifting, driving, or strenuous exercise for 4 weeks . You should walk multiple times per day  Special Instructions  No straining, no heavy lifting > 10lbs x 4 weeks.  Keep incision area clean and dry. May shower in 2 days. No baths or pools for 6 weeks.  Please remove dressing tomorrow, no need to apply a bandage afterwards  You have no sutures to remove, the skin is closed with adhesive  Please take pain medications as directed. Take a stool softener if on pain medications   Please Report any of the following: Nausea or Vomiting, Temperature is greater than 101.57F (38.1C) degrees, Dizziness, Abdominal Pain, Difficulty Breathing or Shortness of Breath, Inability to Eat, drink Fluids, or Take medications, Bleeding, swelling, or drainage from surgical incision sites, New numbness or weakness, and Bowel or bladder dysfunction to the neurosurgeon on call at [phone]  Additional Follow up appointments Please follow up with Cooper Render PA-C in [facility] as scheduled in 2-3 weeks   Please see below for scheduled appointments:  No future appointments.

## 2021-06-25 NOTE — Progress Notes (Signed)
    Attending Progress Note  History: Joshua Mittman. is s/p L4-5 XLIF. L5-S1 decompression   POD1: Reports improved left leg pain this morning. Foley is out but pt has not independently urinated yet  Physical Exam: Vitals:   06/25/21 0519 06/25/21 0745  BP: 131/71 122/62  Pulse: 90 79  Resp: 17 18  Temp: 98.1 F (36.7 C) 98.2 F (36.8 C)  SpO2: 95% 96%    AA Ox3 CNI  Strength:5/5 throughout BLE    Data:  Recent Labs  Lab 06/25/21 0033  CREATININE 1.10   No results for input(s): "AST", "ALT", "ALKPHOS" in the last 168 hours.  Invalid input(s): "TBILI"   Recent Labs  Lab 06/25/21 0033  WBC 11.3*  HGB 13.0  HCT 38.4*  PLT 148*   No results for input(s): "APTT", "INR" in the last 168 hours.       Other tests/results: none   Assessment/Plan:  Joshua Crow. Is a 76 y.o presenting with lumbar radiculopathy and anterolisthesis s/p L4-5 XLIF. L5-S1 decompression   - mobilize - pain control - DVT prophylaxis - PTOT  Cooper Render PA-C Department of Neurosurgery

## 2021-06-25 NOTE — Evaluation (Signed)
Occupational Therapy Evaluation Patient Details Name: Joshua Ferrell. MRN: [id number] DOB: February 07, 1945 Today's Date: 06/25/2021   History of Present Illness Joshua Ferrell is a 89yoM who comes to [facility] on 6/7 for elective lumbar spine joint revision- underwent Left ALIF L4/5, L4/5 PLIF and posterior decompression L5/S1. Pt has failed conservative management of lumbar anterolisthesis and radiculopathy. Orders indicate "no brace needed".   Clinical Impression   Pt seen for OT evaluation this date, POD#1 from lumbar surgery above. Prior to hospital admission, pt was independent with ADL and IADL, recently using AD for mobility 2/2 pain. No falls in past 12 months. Pt lives alone in a single family home with 2 steps to enter. Currently pt requires supervision for mobility with RW and initial VC for maintaining back precautions with good carryover noted. Pt able to complete LB ADL tasks using figure 4 technique in sitting. Pt educated in back precautions with handout provided, self care skills, bed mobility and functional transfer training, AE/DME for bathing, dressing, and toileting needs, and home/routines modifications and falls prevention strategies to maximize safety and functional independence while minimizing falls risk and maintaining precautions. Pt verbalized understanding of all education/training provided. Handout provided to support recall and carry over of learned precautions/techniques for bed mobility, functional transfers, and self care skills. No additional skilled OT needs at this time. Will sign off.   Recommendations for follow up therapy are one component of a multi-disciplinary discharge planning process, led by the attending physician.  Recommendations may be updated based on patient status, additional functional criteria and insurance authorization.   Follow Up Recommendations  No OT follow up    Assistance Recommended at Discharge PRN  Patient can return home with the  following Assistance with cooking/housework;Assist for transportation    Functional Status Assessment  Patient has had a recent decline in their functional status and demonstrates the ability to make significant improvements in function in a reasonable and predictable amount of time.  Equipment Recommendations  None recommended by OT    Recommendations for Other Services       Precautions / Restrictions Precautions Precautions: Back Precaution Booklet Issued: Yes (comment) Restrictions Weight Bearing Restrictions: No      Mobility Bed Mobility Overal bed mobility: Modified Independent             General bed mobility comments: VC for log roll and pt able to return demo    Transfers Overall transfer level: Needs assistance Equipment used: Rolling walker (2 wheels) Transfers: Sit to/from Stand Sit to Stand: Supervision, Min guard           General transfer comment: VC for hand placement and good carryover on additional attempts      Balance Overall balance assessment: No apparent balance deficits (not formally assessed)                                         ADL either performed or assessed with clinical judgement   ADL                                         General ADL Comments: Pt able to complete LB dressing with set up and supervision, using figure 4 technique to access BLEs while maintaining back precautions, no assist required for UB ADL  tasks.     Vision         Perception     Praxis      Pertinent Vitals/Pain Pain Assessment Pain Assessment: 0-10 Pain Score: 9  Pain Location: surgical area Pain Descriptors / Indicators: Stabbing, Sharp Pain Intervention(s): Limited activity within patient's tolerance, Monitored during session, Repositioned, Premedicated before session, Patient requesting pain meds-RN notified     Hand Dominance     Extremity/Trunk Assessment Upper Extremity Assessment Upper  Extremity Assessment: Overall WFL for tasks assessed   Lower Extremity Assessment Lower Extremity Assessment: Overall WFL for tasks assessed   Cervical / Trunk Assessment Cervical / Trunk Assessment: Back Surgery   Communication Communication Communication: No difficulties   Cognition Arousal/Alertness: Awake/alert Behavior During Therapy: WFL for tasks assessed/performed Overall Cognitive Status: Within Functional Limits for tasks assessed                                       General Comments       Exercises Other Exercises Other Exercises: Pt instructed in back precautions and how to maintain during ADL and IADL tasks, home/routines modifications, falls prevention strategies, AE/DME, and positioning during bed mobility and ADL transfers. Handout provided to support recall and carryover.   Shoulder Instructions      Home Living Family/patient expects to be discharged to:: Private residence Living Arrangements: Alone Available Help at Discharge: Family (DTR, SIL) Type of Home: House Home Access: Stairs to enter [employer] of Steps: 2   Home Layout: One level     Bathroom Shower/Tub: Occupational psychologist: [place]: Conservation officer, nature (2 wheels);Rollator (4 wheels);Shower seat - built in   Additional Comments: was using since new onset back pain      Prior Functioning/Environment Prior Level of Function : Independent/Modified Independent;Driving                        OT Problem List: Pain      OT Treatment/Interventions:      OT Goals(Current goals can be found in the care plan section) Acute Rehab OT Goals Patient Stated Goal: get better and have less pain OT Goal Formulation: All assessment and education complete, DC therapy  OT Frequency:      Co-evaluation              AM-PAC OT "6 Clicks" Daily Activity     Outcome Measure Help from another person eating meals?: None Help from  another person taking care of personal grooming?: None Help from another person toileting, which includes using toliet, bedpan, or urinal?: None Help from another person bathing (including washing, rinsing, drying)?: None Help from another person to put on and taking off regular upper body clothing?: None Help from another person to put on and taking off regular lower body clothing?: None 6 Click Score: 24   End of Session Equipment Utilized During Treatment: Rolling walker (2 wheels) Nurse Communication: Patient requests pain meds  Activity Tolerance: Patient tolerated treatment well Patient left: in bed;with call bell/phone within reach (seated EOB per pt's request)  OT Visit Diagnosis: Other abnormalities of gait and mobility (R26.89);Pain Pain - part of body:  (low back)                Time: 0973-5329 OT Time Calculation (min): 23 min Charges:  OT General Charges $OT  Visit: 1 Visit OT Evaluation $OT Eval Low Complexity: 1 Low OT Treatments $Self Care/Home Management : 8-22 mins  Ardeth Perfect., MPH, MS, OTR/L ascom [phone] 06/25/21, 2:21 PM

## 2021-06-25 NOTE — Plan of Care (Signed)
  Problem: Education: Goal: Individualized Educational Video(s) Outcome: Progressing   Problem: Coping: Goal: Ability to adjust to condition or change in health will improve Outcome: Progressing   Problem: Fluid Volume: Goal: Ability to maintain a balanced intake and output will improve Outcome: Progressing   Problem: Health Behavior/Discharge Planning: Goal: Ability to identify and utilize available resources and services will improve Outcome: Progressing   Problem: Nutritional: Goal: Maintenance of adequate nutrition will improve Outcome: Progressing   Problem: Nutritional: Goal: Progress toward achieving an optimal weight will improve Outcome: Progressing   Problem: Tissue Perfusion: Goal: Adequacy of tissue perfusion will improve Outcome: Progressing   Problem: Education: Goal: Knowledge of General Education information will improve Description: Including pain rating scale, medication(s)/side effects and non-pharmacologic comfort measures Outcome: Progressing   Problem: Health Behavior/Discharge Planning: Goal: Ability to manage health-related needs will improve Outcome: Progressing   Problem: Clinical Measurements: Goal: Cardiovascular complication will be avoided Outcome: Progressing   Problem: Activity: Goal: Risk for activity intolerance will decrease Outcome: Progressing   Problem: Nutrition: Goal: Adequate nutrition will be maintained Outcome: Progressing   Problem: Pain Managment: Goal: General experience of comfort will improve Outcome: Progressing   Problem: Safety: Goal: Ability to remain free from injury will improve Outcome: Progressing

## 2021-06-25 NOTE — Discharge Summary (Signed)
Physician Discharge Summary  Patient ID: Joshua Ferrell. MRN: [id number] DOB/AGE: 1945-07-06 76 y.o.  Admit date: 06/24/2021 Discharge date: 06/26/2021  Admission Diagnoses: M43.10-Anterolisthesis , M54.16-Lumbar Radiculopathy   Discharge Diagnoses:  Principal Problem:   S/P lumbar fusion   Discharged Condition: good  Hospital Course:  Joshua Ferrell is a 76 y.o s/p  L4-5 XLIF. L5-S1 decompression. His intraoperative course was uncomplicated. He was admitted for pain control and PT evaluation. He was seen and evaluated by PT and deemed appropriate for discharge home with home health. He was given prescriptions for Oxycodone, Robaxin, and Senna  Consults: None  Significant Diagnostic Studies: none   Treatments: surgery: as above. Please see separately dictated operative report for further details  Discharge Exam: Blood pressure 139/73, pulse 66, temperature 98.6 F (37 C), resp. rate 18, height '5\' 8"'$  (1.727 m), weight 93.9 kg, SpO2 96 %.  AA Ox3 CNI   Strength:5/5 throughout BLE   Disposition: Discharge disposition: 06-Home-Health Care Svc       Discharge Instructions     Incentive spirometry RT   Complete by: As directed    Remove dressing in 24 hours   Complete by: As directed       Allergies as of 06/26/2021       Reactions   Statins    Muscle pain   Celebrex [celecoxib] Itching        Medication List     STOP taking these medications    ezetimibe 10 MG tablet Commonly known as: ZETIA   gabapentin 100 MG capsule Commonly known as: NEURONTIN   meloxicam 7.5 MG tablet Commonly known as: MOBIC       TAKE these medications    acetaminophen 650 MG CR tablet Commonly known as: TYLENOL Take 1,300 mg by mouth 2 (two) times daily.   amLODipine 5 MG tablet Commonly known as: NORVASC Take 5 mg by mouth every morning.   aspirin EC 81 MG tablet Take 81 mg by mouth every morning.   clopidogrel 75 MG tablet Commonly known as:  PLAVIX Take 1 tablet (75 mg total) by mouth daily with breakfast.   glimepiride 2 MG tablet Commonly known as: AMARYL Take 2 mg by mouth daily with breakfast.   Investigational - Study Medication Take 14 mg by mouth daily before breakfast. Study name: semaglutide 14 mg or placebo   metFORMIN 500 MG 24 hr tablet Commonly known as: GLUCOPHAGE-XR Take 1,000 mg by mouth 2 (two) times daily.   metFORMIN 500 MG tablet Commonly known as: GLUCOPHAGE Take 1,000 mg by mouth 2 (two) times daily with a meal.   methocarbamol 500 MG tablet Commonly known as: ROBAXIN Take 1 tablet (500 mg total) by mouth every 8 (eight) hours as needed for muscle spasms.   metoprolol succinate 50 MG 24 hr tablet Commonly known as: TOPROL-XL Take 1 tablet (50 mg total) by mouth daily. Take with or immediately following a meal. What changed: when to take this   nitroGLYCERIN 0.4 MG SL tablet Commonly known as: NITROSTAT Place 0.4 mg under the tongue every 5 (five) minutes as needed for chest pain.   oxyCODONE 5 MG immediate release tablet Commonly known as: Oxy IR/ROXICODONE Take 1 tablet (5 mg total) by mouth every 3 (three) hours as needed for moderate pain ((score 4 to 6)).   senna 8.6 MG Tabs tablet Commonly known as: SENOKOT Take 1 tablet (8.6 mg total) by mouth 2 (two) times daily as needed for mild constipation.   valsartan-hydrochlorothiazide  160-12.5 MG tablet Commonly known as: DIOVAN-HCT Take 1 tablet by mouth every morning.        Follow-up Information     Loleta Dicker, PA Follow up in 2 week(s).   Why: For incision and post-op check. This appointment date and time are on your pre-op paperwork Contact information: [place] [address] [phone]                 Signed: Loleta Dicker 06/26/2021, 8:48 AM

## 2021-06-25 NOTE — Evaluation (Signed)
Physical Therapy Evaluation Patient Details Name: Joshua Ferrell. MRN: [id number] DOB: 1945-05-23 Today's Date: 06/25/2021  History of Present Illness  Joshua Ferrell is a 64yoM who comes to [facility] on 6/7 for elective lumbar spine joint revision- underwent Left ALIF L4/5, L4/5 PLIF and posterior decompression L5/S1. Pt has failed conservative management of lumbar anterolisthesis and radiculopathy. Orders indicate "no brace needed".  Clinical Impression  Pt admitted c above Dx. Pt shows functional limitations due to the deficits listed below (see "PT Problem List"). Patient agreeable to PT evaluation. PLOF and home setup obtained. Pt able to perform bed mobility, transfers, and AMB with greater than typical effort, but without any physical assist or unsteadiness. Educated on RW use, BAT precautions. Patient's assessment reveals acute need for additional person for safety and/or physical assistance to complete their typical ADL. At baseline, the patient is able to perform ADL with modified independence. Patient will benefit from skilled PT intervention to maximize independence and safety in mobility required for basic ADL performance at discharge.        Recommendations for follow up therapy are one component of a multi-disciplinary discharge planning process, led by the attending physician.  Recommendations may be updated based on patient status, additional functional criteria and insurance authorization.  Follow Up Recommendations Follow physician's recommendations for discharge plan and follow up therapies    Assistance Recommended at Discharge Set up Supervision/Assistance  Patient can return home with the following       Equipment Recommendations None recommended by PT  Recommendations for Other Services       Functional Status Assessment Patient has had a recent decline in their functional status and demonstrates the ability to make significant improvements in function in a  reasonable and predictable amount of time.     Precautions / Restrictions Precautions Precautions: None;Back Restrictions Weight Bearing Restrictions: No      Mobility  Bed Mobility               General bed mobility comments: in chair on entry; discussed log roll recs    Transfers Overall transfer level: Needs assistance Equipment used: Rolling walker (2 wheels) Transfers: Sit to/from Stand Sit to Stand: Supervision                Ambulation/Gait Ambulation/Gait assistance: Min guard Gait Distance (Feet): 240 Feet Assistive device: Rolling walker (2 wheels) Gait Pattern/deviations: WFL(Within Functional Limits)       General Gait Details: moving well, no LOB, feels good  Stairs            Wheelchair Mobility    Modified Rankin (Stroke Patients Only)       Balance                                             Pertinent Vitals/Pain Pain Assessment Pain Assessment: 0-10 Pain Score: 3  Pain Location: surgical area Pain Descriptors / Indicators: Aching Pain Intervention(s): Limited activity within patient's tolerance, Monitored during session    Home Living Family/patient expects to be discharged to:: Private residence Living Arrangements: Alone Available Help at Discharge: Family (DTR, SIL) Type of Home: House Home Access: Stairs to enter   [employer] of Steps: 2   Home Layout: One level Home Equipment: Conservation officer, nature (2 wheels);Rollator (4 wheels) Additional Comments: was using since new onset back pain    Prior Function  Prior Level of Function : Independent/Modified Independent                     Hand Dominance        Extremity/Trunk Assessment   Upper Extremity Assessment Upper Extremity Assessment: Overall WFL for tasks assessed    Lower Extremity Assessment Lower Extremity Assessment: Overall WFL for tasks assessed       Communication      Cognition Arousal/Alertness:  Awake/alert Behavior During Therapy: WFL for tasks assessed/performed Overall Cognitive Status: Within Functional Limits for tasks assessed                                          General Comments      Exercises     Assessment/Plan    PT Assessment Patient needs continued PT services  PT Problem List Decreased strength;Decreased range of motion;Decreased activity tolerance;Decreased balance;Decreased mobility       PT Treatment Interventions DME instruction;Gait training;Stair training;Functional mobility training;Therapeutic activities;Therapeutic exercise;Patient/family education    PT Goals (Current goals can be found in the Care Plan section)  Acute Rehab PT Goals Patient Stated Goal: return to home Time For Goal Achievement: 07/09/21 Potential to Achieve Goals: Good    Frequency 7X/week     Co-evaluation               AM-PAC PT "6 Clicks" Mobility  Outcome Measure Help needed turning from your back to your side while in a flat bed without using bedrails?: A Little Help needed moving from lying on your back to sitting on the side of a flat bed without using bedrails?: A Little Help needed moving to and from a bed to a chair (including a wheelchair)?: A Little Help needed standing up from a chair using your arms (e.g., wheelchair or bedside chair)?: A Little Help needed to walk in hospital room?: A Little Help needed climbing 3-5 steps with a railing? : A Little 6 Click Score: 18    End of Session Equipment Utilized During Treatment: Gait belt Activity Tolerance: Patient tolerated treatment well Patient left: in chair;with family/visitor present Nurse Communication: Mobility status PT Visit Diagnosis: Difficulty in walking, not elsewhere classified (R26.2);Unsteadiness on feet (R26.81);Other abnormalities of gait and mobility (R26.89);Repeated falls (R29.6);Muscle weakness (generalized) (M62.81);History of falling (Z91.81);Dizziness and  giddiness (R42)    Time: 1610-9604 PT Time Calculation (min) (ACUTE ONLY): 26 min   Charges:   PT Evaluation $PT Eval Low Complexity: 1 Low PT Treatments $Gait Training: 8-22 mins       12:03 PM, 06/25/21 Etta Grandchild, PT, DPT Physical Therapist - [facility]  [phone] ([place])    Terril Amaro C 06/25/2021, 12:00 PM

## 2021-06-26 ENCOUNTER — Encounter: Payer: Self-pay | Admitting: Neurosurgery

## 2021-06-26 LAB — GLUCOSE, CAPILLARY: Glucose-Capillary: 195 mg/dL — ABNORMAL HIGH (ref 70–99)

## 2021-06-26 MED ORDER — METHOCARBAMOL 500 MG PO TABS: 500.0000 mg | ORAL_TABLET | Freq: Three times a day (TID) | ORAL | 0 refills | Status: DC | PRN

## 2021-06-26 MED ORDER — SENNA 8.6 MG PO TABS: 1.0000 | ORAL_TABLET | Freq: Two times a day (BID) | ORAL | 0 refills | Status: DC | PRN

## 2021-06-26 MED ORDER — OXYCODONE HCL 5 MG PO TABS: 5.0000 mg | ORAL_TABLET | ORAL | 0 refills | Status: DC | PRN

## 2021-06-26 NOTE — Plan of Care (Signed)

## 2021-06-26 NOTE — Progress Notes (Signed)
Blood pressure 139/73, pulse 66, temperature 98.6 F (37 C), resp. rate 18, height '5\' 8"'$  (1.727 m), weight 93.9 kg, SpO2 96 %. Iv removed site c/d/I discussed d/c packet with pt and son at bedside pt d/c via w/c with all belongings down to private car.

## 2021-06-26 NOTE — Plan of Care (Signed)

## 2021-06-26 NOTE — Care Management Important Message (Signed)
Important Message  Patient Details  Name: Joshua Ferrell. MRN: [id number] Date of Birth: [birth year]   Medicare Important Message Given:  N/A - LOS <3 / Initial given by admissions     Juliann Pulse A Meegan Shanafelt 06/26/2021, 7:56 AM

## 2021-06-26 NOTE — TOC CM/SW Note (Signed)
Patient was prearranged by Dr. Nelly Laurence office with [facility]. CSW notified Meg with [facility] of DC. Meg says she already has all needed orders.  Oleh Genin, [place]

## 2021-06-26 NOTE — Progress Notes (Signed)
    Attending Progress Note  History: Joshua Ferrell. is s/p L4-5 XLIF. L5-S1 decompression   POD2: Improved pain this morning. Eager to d/c home  POD1: Reports improved left leg pain this morning. Foley is out but pt has not independently urinated yet  Physical Exam: Vitals:   06/26/21 0526 06/26/21 0737  BP: 129/66 139/73  Pulse: 69 66  Resp: 17 18  Temp: 98.1 F (36.7 C) 98.6 F (37 C)  SpO2: 98% 96%    AA Ox3 CNI  Strength:5/5 throughout BLE    Data:  Recent Labs  Lab 06/25/21 0033  CREATININE 1.10    No results for input(s): "AST", "ALT", "ALKPHOS" in the last 168 hours.  Invalid input(s): "TBILI"   Recent Labs  Lab 06/25/21 0033  WBC 11.3*  HGB 13.0  HCT 38.4*  PLT 148*    No results for input(s): "APTT", "INR" in the last 168 hours.       Other tests/results: none   Assessment/Plan:  Joshua Ferrell. Is a 76 y.o presenting with lumbar radiculopathy and anterolisthesis s/p L4-5 XLIF. L5-S1 decompression   - mobilize - pain control - DVT prophylaxis - PTOT; plans for d/c with home health  Cooper Render PA-C Department of Neurosurgery

## 2021-06-29 ENCOUNTER — Encounter: Payer: Self-pay | Admitting: Neurosurgery

## 2021-06-29 DIAGNOSIS — M5416 Radiculopathy, lumbar region: Secondary | ICD-10-CM | POA: Diagnosis not present

## 2021-06-29 DIAGNOSIS — M4316 Spondylolisthesis, lumbar region: Secondary | ICD-10-CM | POA: Diagnosis not present

## 2021-06-29 DIAGNOSIS — E1122 Type 2 diabetes mellitus with diabetic chronic kidney disease: Secondary | ICD-10-CM | POA: Diagnosis not present

## 2021-06-29 DIAGNOSIS — Z7984 Long term (current) use of oral hypoglycemic drugs: Secondary | ICD-10-CM | POA: Diagnosis not present

## 2021-06-29 DIAGNOSIS — R2681 Unsteadiness on feet: Secondary | ICD-10-CM | POA: Diagnosis not present

## 2021-06-29 DIAGNOSIS — I10 Essential (primary) hypertension: Secondary | ICD-10-CM | POA: Diagnosis not present

## 2021-06-29 DIAGNOSIS — I25110 Atherosclerotic heart disease of native coronary artery with unstable angina pectoris: Secondary | ICD-10-CM | POA: Diagnosis not present

## 2021-06-29 DIAGNOSIS — Z4789 Encounter for other orthopedic aftercare: Secondary | ICD-10-CM | POA: Diagnosis not present

## 2021-07-09 DIAGNOSIS — E1121 Type 2 diabetes mellitus with diabetic nephropathy: Secondary | ICD-10-CM | POA: Diagnosis not present

## 2021-07-09 DIAGNOSIS — I10 Essential (primary) hypertension: Secondary | ICD-10-CM | POA: Diagnosis not present

## 2021-07-09 DIAGNOSIS — I2510 Atherosclerotic heart disease of native coronary artery without angina pectoris: Secondary | ICD-10-CM | POA: Diagnosis not present

## 2021-07-10 DIAGNOSIS — T466X5A Adverse effect of antihyperlipidemic and antiarteriosclerotic drugs, initial encounter: Secondary | ICD-10-CM | POA: Diagnosis not present

## 2021-07-10 DIAGNOSIS — I2510 Atherosclerotic heart disease of native coronary artery without angina pectoris: Secondary | ICD-10-CM | POA: Diagnosis not present

## 2021-07-10 DIAGNOSIS — H6192 Disorder of left external ear, unspecified: Secondary | ICD-10-CM | POA: Diagnosis not present

## 2021-07-10 DIAGNOSIS — I10 Essential (primary) hypertension: Secondary | ICD-10-CM | POA: Diagnosis not present

## 2021-07-10 DIAGNOSIS — G720 Drug-induced myopathy: Secondary | ICD-10-CM | POA: Diagnosis not present

## 2021-07-10 DIAGNOSIS — E1121 Type 2 diabetes mellitus with diabetic nephropathy: Secondary | ICD-10-CM | POA: Diagnosis not present

## 2021-07-25 ENCOUNTER — Other Ambulatory Visit: Payer: Self-pay | Admitting: Neurosurgery

## 2021-07-25 ENCOUNTER — Encounter: Payer: Self-pay | Admitting: Neurosurgery

## 2021-07-25 MED ORDER — OXYCODONE HCL 5 MG PO TABS: 5.0000 mg | ORAL_TABLET | ORAL | 0 refills | Status: AC | PRN

## 2021-08-04 ENCOUNTER — Telehealth: Payer: Self-pay

## 2021-08-04 ENCOUNTER — Telehealth: Payer: Self-pay | Admitting: Neurosurgery

## 2021-08-04 NOTE — Telephone Encounter (Signed)
We do not prescribe metformin, as this is a medication for diabetes. Can you please contact him and confirm that is what he intended to ask for a refill of? He may have meant to ask for methocarbamol, which is a muscle relaxer and sounds similar. Thanks

## 2021-08-04 NOTE — Telephone Encounter (Signed)
-----   Message from Conley Simmonds sent at 08/04/2021  9:31 AM EDT ----- Regarding: Med refill Received call from patient to request refill of   metFORMIN (GLUCOPHAGE-XR) 500 MG 24 hr tablet  Patient takes at night; took last pill last night.  Pharmacy confirmed as [facility] on [address].  Please advise patient at [phone] when refill called in.

## 2021-08-04 NOTE — Telephone Encounter (Signed)
Erroneous encounter. Please disregard.

## 2021-08-05 ENCOUNTER — Other Ambulatory Visit: Payer: Self-pay

## 2021-08-05 ENCOUNTER — Other Ambulatory Visit: Payer: Self-pay | Admitting: Internal Medicine

## 2021-08-05 DIAGNOSIS — Z981 Arthrodesis status: Secondary | ICD-10-CM

## 2021-08-05 MED ORDER — METHOCARBAMOL 500 MG PO TABS: 500.0000 mg | ORAL_TABLET | Freq: Two times a day (BID) | ORAL | 0 refills | Status: DC | PRN

## 2021-08-05 NOTE — Telephone Encounter (Signed)
Methocarbamol '500mg'$  1-2 aday [facility] on [address]

## 2021-08-05 NOTE — Telephone Encounter (Signed)
Left message on voice mail that his medication was refilled. Can call the office with any further questions.

## 2021-08-06 ENCOUNTER — Ambulatory Visit
Admission: RE | Admit: 2021-08-06 | Discharge: 2021-08-06 | Disposition: A | Discharge status: Home / Self Care | Payer: PPO | Attending: Neurosurgery | Admitting: Neurosurgery

## 2021-08-06 ENCOUNTER — Encounter: Payer: Self-pay | Admitting: Neurosurgery

## 2021-08-06 ENCOUNTER — Ambulatory Visit
Admission: RE | Admit: 2021-08-06 | Discharge: 2021-08-06 | Disposition: A | Discharge status: Home / Self Care | Payer: PPO | Source: Ambulatory Visit | Attending: Neurosurgery | Admitting: Neurosurgery

## 2021-08-06 ENCOUNTER — Ambulatory Visit: Payer: PPO | Admitting: Neurosurgery

## 2021-08-06 VITALS — BP 112/68 | HR 75 | Temp 98.3°F | Ht 67.0 in | Wt 209.4 lb

## 2021-08-06 DIAGNOSIS — Z981 Arthrodesis status: Secondary | ICD-10-CM

## 2021-08-06 DIAGNOSIS — M47817 Spondylosis without myelopathy or radiculopathy, lumbosacral region: Secondary | ICD-10-CM | POA: Diagnosis not present

## 2021-08-06 DIAGNOSIS — I700 Atherosclerosis of aorta: Secondary | ICD-10-CM | POA: Diagnosis not present

## 2021-08-06 DIAGNOSIS — M4326 Fusion of spine, lumbar region: Secondary | ICD-10-CM | POA: Diagnosis not present

## 2021-08-06 DIAGNOSIS — M47816 Spondylosis without myelopathy or radiculopathy, lumbar region: Secondary | ICD-10-CM | POA: Diagnosis not present

## 2021-08-06 MED ORDER — MELOXICAM 7.5 MG PO TABS: 7.5000 mg | ORAL_TABLET | Freq: Every day | ORAL | 0 refills | Status: DC

## 2021-08-06 MED ORDER — GABAPENTIN 300 MG PO CAPS
300.0000 mg | ORAL_CAPSULE | Freq: Three times a day (TID) | ORAL | 0 refills | Status: DC
Start: 2021-08-06 — End: 2021-09-16

## 2021-08-06 NOTE — Progress Notes (Signed)
   DOS: 06/24/21 (L4-5 XLIFPSF)  HISTORY OF PRESENT ILLNESS: 08/06/2021 [name] is status post the above surgery.  He is doing relatively well.  He has some pain in his left foot prickly when he sits.  Otherwise he is doing well.  PHYSICAL EXAMINATION:   Vitals:   08/06/21 1115  BP: 112/68  Pulse: 75  Temp: 98.3 F (36.8 C)   General: Patient is well developed, well nourished, calm, collected, and in no apparent distress.  NEUROLOGICAL:  General: In no acute distress.  Awake, alert, oriented to person, place, and time. Pupils equal round and reactive to light.   Strength:  Side Iliopsoas Quads Hamstring PF DF EHL  R '5 5 5 5 5 5  '$ L '5 5 5 5 5 5   '$ Incision c/d/i   ROS (Neurologic): Negative except as noted above  IMAGING: No complications noted  ASSESSMENT/PLAN:  Joshua Ferrell is doing well after lumbar spine surgery.  I think he will continue to improve.  I will see him back in 6 weeks with x-rays.  We will start some meloxicam to see if that helps with his foot pain.  We will also increase his gabapentin.  I spent a total of 15 minutes in face-to-face and non-face-to-face activities related to this patient's care today.   Meade Maw MD, [facility] Department of Neurosurgery

## 2021-08-12 ENCOUNTER — Telehealth: Payer: Self-pay

## 2021-08-12 NOTE — Telephone Encounter (Signed)
I spoke with Mr Joshua Ferrell. He has been taking meloxicam 7.'5mg'$  and gabapentin '300mg'$  TID that Dr Izora Ribas sent in on 08/06/21. When I asked him if this has made any difference, he replied "not a lot". I informed him that I will send a message to Dr Izora Ribas for further recommendation.

## 2021-08-12 NOTE — Telephone Encounter (Signed)
I notified Joshua Ferrell of Dr Rhea Bleacher recommendation to increase his gabapentin to '600mg'$  TID. He verbalized understanding. He states he has plenty left but will contact us when he needs a refill if it is helping.

## 2021-08-12 NOTE — Telephone Encounter (Signed)
-----   Message from Peggyann Shoals sent at 08/12/2021 10:08 AM EDT ----- Regarding: lt leg pain Contact: [phone] 06/24/21 L4-5 XLIF PSF Patient seen last week. He told you that he was having pain and a burning feeling on the top of his left foot up his calf. What can you recommend for this? He is rubbing a topical cream on his foot/calf similar to Aspercreme but that is not helping.

## 2021-08-27 DIAGNOSIS — H43813 Vitreous degeneration, bilateral: Secondary | ICD-10-CM | POA: Diagnosis not present

## 2021-09-16 ENCOUNTER — Telehealth: Payer: Self-pay

## 2021-09-16 MED ORDER — GABAPENTIN 300 MG PO CAPS: 600.0000 mg | ORAL_CAPSULE | Freq: Three times a day (TID) | ORAL | 1 refills | Status: DC

## 2021-09-16 NOTE — Telephone Encounter (Signed)
Refill sent.

## 2021-09-16 NOTE — Telephone Encounter (Signed)
Patient notified

## 2021-09-16 NOTE — Telephone Encounter (Signed)
-----   Message from Peggyann Shoals sent at 09/16/2021  8:17 AM EDT ----- Regarding: med refill Contact: [phone] Gabapentin '300mg'$ -he takes '600mg'$  3 times a day [employer]

## 2021-09-18 ENCOUNTER — Other Ambulatory Visit: Payer: Self-pay

## 2021-09-18 DIAGNOSIS — Z981 Arthrodesis status: Secondary | ICD-10-CM

## 2021-09-22 ENCOUNTER — Ambulatory Visit (INDEPENDENT_AMBULATORY_CARE_PROVIDER_SITE_OTHER): Payer: PPO | Admitting: Neurosurgery

## 2021-09-22 ENCOUNTER — Ambulatory Visit
Admission: RE | Admit: 2021-09-22 | Discharge: 2021-09-22 | Disposition: A | Discharge status: Home / Self Care | Payer: PPO | Attending: Neurosurgery | Admitting: Neurosurgery

## 2021-09-22 ENCOUNTER — Encounter: Payer: Self-pay | Admitting: Neurosurgery

## 2021-09-22 ENCOUNTER — Ambulatory Visit
Admission: RE | Admit: 2021-09-22 | Discharge: 2021-09-22 | Disposition: A | Discharge status: Home / Self Care | Payer: PPO | Source: Ambulatory Visit | Attending: Neurosurgery | Admitting: Neurosurgery

## 2021-09-22 VITALS — BP 163/80 | HR 78 | Temp 97.8°F | Wt 214.0 lb

## 2021-09-22 DIAGNOSIS — Z981 Arthrodesis status: Secondary | ICD-10-CM

## 2021-09-22 DIAGNOSIS — Z09 Encounter for follow-up examination after completed treatment for conditions other than malignant neoplasm: Secondary | ICD-10-CM

## 2021-09-22 DIAGNOSIS — M545 Low back pain, unspecified: Secondary | ICD-10-CM | POA: Diagnosis not present

## 2021-09-22 DIAGNOSIS — M5416 Radiculopathy, lumbar region: Secondary | ICD-10-CM

## 2021-09-22 DIAGNOSIS — M4316 Spondylolisthesis, lumbar region: Secondary | ICD-10-CM

## 2021-09-22 NOTE — Progress Notes (Signed)
   DOS: 06/24/21 (L4-5 XLIFPSF)  HISTORY OF PRESENT ILLNESS: 09/22/2021 Mr. Reo Portela is status post the above surgery.  He is doing relatively well.  His symptoms are better than prior to surgery.  PHYSICAL EXAMINATION:   Vitals:   09/22/21 1130  BP: (!) 163/80  Pulse: 78  Temp: 97.8 F (36.6 C)   General: Patient is well developed, well nourished, calm, collected, and in no apparent distress.  NEUROLOGICAL:  General: In no acute distress.  Awake, alert, oriented to person, place, and time. Pupils equal round and reactive to light.   Strength:  Side Iliopsoas Quads Hamstring PF DF EHL  R '5 5 5 5 5 5  '$ L '5 5 5 5 5 5   '$ Incision c/d/i   ROS (Neurologic): Negative except as noted above  IMAGING: No complications noted  ASSESSMENT/PLAN:  Jaxxson Cavanah is doing well after lumbar spine surgery.  I think he will continue to improve.  I will see him back in 6 monts with x-rays.  I spent a total of 15 minutes in face-to-face and non-face-to-face activities related to this patient's care today.   Meade Maw MD, [facility] Department of Neurosurgery

## 2021-09-28 DIAGNOSIS — L570 Actinic keratosis: Secondary | ICD-10-CM | POA: Diagnosis not present

## 2021-09-28 DIAGNOSIS — D0439 Carcinoma in situ of skin of other parts of face: Secondary | ICD-10-CM | POA: Diagnosis not present

## 2021-09-28 DIAGNOSIS — D485 Neoplasm of uncertain behavior of skin: Secondary | ICD-10-CM | POA: Diagnosis not present

## 2021-09-28 DIAGNOSIS — X32XXXA Exposure to sunlight, initial encounter: Secondary | ICD-10-CM | POA: Diagnosis not present

## 2021-09-28 DIAGNOSIS — D2262 Melanocytic nevi of left upper limb, including shoulder: Secondary | ICD-10-CM | POA: Diagnosis not present

## 2021-09-28 DIAGNOSIS — D2272 Melanocytic nevi of left lower limb, including hip: Secondary | ICD-10-CM | POA: Diagnosis not present

## 2021-09-28 DIAGNOSIS — Z85828 Personal history of other malignant neoplasm of skin: Secondary | ICD-10-CM | POA: Diagnosis not present

## 2021-09-28 DIAGNOSIS — D2261 Melanocytic nevi of right upper limb, including shoulder: Secondary | ICD-10-CM | POA: Diagnosis not present

## 2021-10-08 DIAGNOSIS — E785 Hyperlipidemia, unspecified: Secondary | ICD-10-CM | POA: Diagnosis not present

## 2021-10-08 DIAGNOSIS — I252 Old myocardial infarction: Secondary | ICD-10-CM | POA: Diagnosis not present

## 2021-10-08 DIAGNOSIS — Z23 Encounter for immunization: Secondary | ICD-10-CM | POA: Diagnosis not present

## 2021-10-08 DIAGNOSIS — I10 Essential (primary) hypertension: Secondary | ICD-10-CM | POA: Diagnosis not present

## 2021-10-08 DIAGNOSIS — I2510 Atherosclerotic heart disease of native coronary artery without angina pectoris: Secondary | ICD-10-CM | POA: Diagnosis not present

## 2021-10-14 DIAGNOSIS — D0422 Carcinoma in situ of skin of left ear and external auricular canal: Secondary | ICD-10-CM | POA: Diagnosis not present

## 2021-10-14 DIAGNOSIS — D0439 Carcinoma in situ of skin of other parts of face: Secondary | ICD-10-CM | POA: Diagnosis not present

## 2021-10-29 NOTE — Progress Notes (Addendum)
   REFERRING PHYSICIAN:  No referring provider defined for this encounter.  DOS: 06/24/21 L4-5 XLIF/PSF  HISTORY OF PRESENT ILLNESS: Joshua Ferrell. Was last seen by Dr. Izora Ribas on 09/22/21 and he was doing well.   He was pushing a trailer about a week ago and felt a pull in his back. Since that time, he notes pain in right lower back/buttocks with some radiation posterior to his knee. No numbness or tingling. No back pain.   He is taking tylenol and neurontin.    PHYSICAL EXAMINATION:  General: Patient is well developed, well nourished, calm, collected, and in no apparent distress.   NEUROLOGICAL:  General: In no acute distress.   Awake, alert, oriented to person, place, and time.  Pupils equal round and reactive to light.  Facial tone is symmetric.     Strength:            Side Iliopsoas Quads Hamstring PF DF EHL  R '5 5 5 5 5 5  '$ L '5 5 5 5 5 5   '$ Incisions well healed  No lower lumbar tenderness.   He has tenderness over insertion of hamstrings and along posterior thigh.   Bilateral lower extremity sensation is intact to light touch.    ROS (Neurologic):  Negative except as noted above  IMAGING: Lumbar spine xrays dated 10/30/21 (done on his way out):  Instrumented fusion O6-V6 with no complications noted. No change from previous xrays.   Xray report not available for my review.   ASSESSMENT/PLAN:  Joshua Ferrell. Has been doing well s/p above surgery.   He was pushing a trailer about a week ago and felt a pull in his back. Since that time, he notes pain in right lower back/buttocks with some radiation posterior to his knee.   Pain appears more muscular in nature and is likely due to muscle strain.   Treatment options reviewed with patient and following plan made:   - Xrays of his lumbar spine done on his way out.  - He was started on neurontin prior to surgery. Would like to stop it. Given weaning instructions. Call if any issues.   - Called him  after his visit with xray results. Pain likely due to muscle strain as above. If no improvement over next few weeks, can consider referral to ortho.  - Also discussed BP with him, was 157/83. No symptoms. He checks at home and says is usually lower. Advised to call PCP with any concerns.  - Follow up as scheduled  and prn.   Advised to contact the office if any questions or concerns arise.  Geronimo Boot PA-C Department of neurosurgery

## 2021-10-30 ENCOUNTER — Ambulatory Visit
Admission: RE | Admit: 2021-10-30 | Discharge: 2021-10-30 | Disposition: A | Discharge status: Home / Self Care | Payer: PPO | Attending: Orthopedic Surgery | Admitting: Orthopedic Surgery

## 2021-10-30 ENCOUNTER — Ambulatory Visit
Admission: RE | Admit: 2021-10-30 | Discharge: 2021-10-30 | Disposition: A | Discharge status: Home / Self Care | Payer: PPO | Source: Ambulatory Visit | Attending: Orthopedic Surgery | Admitting: Orthopedic Surgery

## 2021-10-30 ENCOUNTER — Encounter: Payer: Self-pay | Admitting: Orthopedic Surgery

## 2021-10-30 ENCOUNTER — Ambulatory Visit: Payer: PPO | Admitting: Orthopedic Surgery

## 2021-10-30 VITALS — BP 157/83 | HR 65 | Ht 68.0 in | Wt 215.6 lb

## 2021-10-30 DIAGNOSIS — M4326 Fusion of spine, lumbar region: Secondary | ICD-10-CM | POA: Diagnosis not present

## 2021-10-30 DIAGNOSIS — K8020 Calculus of gallbladder without cholecystitis without obstruction: Secondary | ICD-10-CM | POA: Diagnosis not present

## 2021-10-30 DIAGNOSIS — M47816 Spondylosis without myelopathy or radiculopathy, lumbar region: Secondary | ICD-10-CM | POA: Diagnosis not present

## 2021-10-30 DIAGNOSIS — Z981 Arthrodesis status: Secondary | ICD-10-CM

## 2021-10-30 DIAGNOSIS — M5416 Radiculopathy, lumbar region: Secondary | ICD-10-CM | POA: Diagnosis not present

## 2021-10-30 DIAGNOSIS — M4316 Spondylolisthesis, lumbar region: Secondary | ICD-10-CM

## 2021-10-30 DIAGNOSIS — Z09 Encounter for follow-up examination after completed treatment for conditions other than malignant neoplasm: Secondary | ICD-10-CM | POA: Diagnosis not present

## 2021-10-30 DIAGNOSIS — M47817 Spondylosis without myelopathy or radiculopathy, lumbosacral region: Secondary | ICD-10-CM | POA: Diagnosis not present

## 2021-10-30 DIAGNOSIS — M5136 Other intervertebral disc degeneration, lumbar region: Secondary | ICD-10-CM | POA: Diagnosis not present

## 2021-10-30 NOTE — Patient Instructions (Signed)
How  to stop gabapentin:  Take '600mg'$  (two pills) once a day for 2 days Then take '300mg'$  (one pill) once a day for 2 days Then stop.   If pain gets worse, we can always restart it. Call us.   Will call you with your xray results. May not be until Monday.   Call if you need anything.   Marzetta Board  [phone]

## 2021-11-04 DIAGNOSIS — E1121 Type 2 diabetes mellitus with diabetic nephropathy: Secondary | ICD-10-CM | POA: Diagnosis not present

## 2021-11-04 DIAGNOSIS — I2510 Atherosclerotic heart disease of native coronary artery without angina pectoris: Secondary | ICD-10-CM | POA: Diagnosis not present

## 2021-11-11 DIAGNOSIS — E785 Hyperlipidemia, unspecified: Secondary | ICD-10-CM | POA: Diagnosis not present

## 2021-11-11 DIAGNOSIS — E1121 Type 2 diabetes mellitus with diabetic nephropathy: Secondary | ICD-10-CM | POA: Diagnosis not present

## 2021-11-11 DIAGNOSIS — G720 Drug-induced myopathy: Secondary | ICD-10-CM | POA: Diagnosis not present

## 2021-11-11 DIAGNOSIS — I10 Essential (primary) hypertension: Secondary | ICD-10-CM | POA: Diagnosis not present

## 2021-11-11 DIAGNOSIS — T466X5A Adverse effect of antihyperlipidemic and antiarteriosclerotic drugs, initial encounter: Secondary | ICD-10-CM | POA: Diagnosis not present

## 2021-11-11 DIAGNOSIS — I2510 Atherosclerotic heart disease of native coronary artery without angina pectoris: Secondary | ICD-10-CM | POA: Diagnosis not present

## 2021-11-16 DIAGNOSIS — M25511 Pain in right shoulder: Secondary | ICD-10-CM | POA: Diagnosis not present

## 2021-11-16 DIAGNOSIS — M19011 Primary osteoarthritis, right shoulder: Secondary | ICD-10-CM | POA: Diagnosis not present

## 2021-11-16 DIAGNOSIS — E785 Hyperlipidemia, unspecified: Secondary | ICD-10-CM | POA: Diagnosis not present

## 2021-11-16 DIAGNOSIS — I10 Essential (primary) hypertension: Secondary | ICD-10-CM | POA: Diagnosis not present

## 2021-11-16 DIAGNOSIS — R0609 Other forms of dyspnea: Secondary | ICD-10-CM | POA: Diagnosis not present

## 2021-11-16 DIAGNOSIS — I214 Non-ST elevation (NSTEMI) myocardial infarction: Secondary | ICD-10-CM | POA: Diagnosis not present

## 2021-11-16 DIAGNOSIS — G8929 Other chronic pain: Secondary | ICD-10-CM | POA: Diagnosis not present

## 2021-11-16 DIAGNOSIS — I25118 Atherosclerotic heart disease of native coronary artery with other forms of angina pectoris: Secondary | ICD-10-CM | POA: Diagnosis not present

## 2021-11-16 DIAGNOSIS — Z23 Encounter for immunization: Secondary | ICD-10-CM | POA: Diagnosis not present

## 2021-11-24 DIAGNOSIS — J329 Chronic sinusitis, unspecified: Secondary | ICD-10-CM | POA: Diagnosis not present

## 2021-12-22 DIAGNOSIS — I10 Essential (primary) hypertension: Secondary | ICD-10-CM | POA: Diagnosis not present

## 2021-12-22 DIAGNOSIS — I2510 Atherosclerotic heart disease of native coronary artery without angina pectoris: Secondary | ICD-10-CM | POA: Diagnosis not present

## 2021-12-22 DIAGNOSIS — I252 Old myocardial infarction: Secondary | ICD-10-CM | POA: Diagnosis not present

## 2021-12-22 DIAGNOSIS — R0609 Other forms of dyspnea: Secondary | ICD-10-CM | POA: Diagnosis not present

## 2022-03-17 DIAGNOSIS — E785 Hyperlipidemia, unspecified: Secondary | ICD-10-CM | POA: Diagnosis not present

## 2022-03-17 DIAGNOSIS — E1121 Type 2 diabetes mellitus with diabetic nephropathy: Secondary | ICD-10-CM | POA: Diagnosis not present

## 2022-03-17 DIAGNOSIS — I2510 Atherosclerotic heart disease of native coronary artery without angina pectoris: Secondary | ICD-10-CM | POA: Diagnosis not present

## 2022-03-22 ENCOUNTER — Other Ambulatory Visit: Payer: Self-pay

## 2022-03-22 DIAGNOSIS — Z981 Arthrodesis status: Secondary | ICD-10-CM

## 2022-03-23 ENCOUNTER — Ambulatory Visit
Admission: RE | Admit: 2022-03-23 | Discharge: 2022-03-23 | Disposition: A | Discharge status: Home / Self Care | Payer: PPO | Source: Ambulatory Visit | Attending: Neurosurgery | Admitting: Neurosurgery

## 2022-03-23 ENCOUNTER — Ambulatory Visit (INDEPENDENT_AMBULATORY_CARE_PROVIDER_SITE_OTHER): Payer: PPO | Admitting: Neurosurgery

## 2022-03-23 ENCOUNTER — Ambulatory Visit
Admission: RE | Admit: 2022-03-23 | Discharge: 2022-03-23 | Disposition: A | Discharge status: Home / Self Care | Payer: PPO | Attending: Neurosurgery | Admitting: Neurosurgery

## 2022-03-23 ENCOUNTER — Encounter: Payer: Self-pay | Admitting: Neurosurgery

## 2022-03-23 VITALS — BP 122/60 | HR 67 | Ht 68.0 in | Wt 215.8 lb

## 2022-03-23 DIAGNOSIS — Z981 Arthrodesis status: Secondary | ICD-10-CM

## 2022-03-23 DIAGNOSIS — M4316 Spondylolisthesis, lumbar region: Secondary | ICD-10-CM

## 2022-03-23 DIAGNOSIS — M5136 Other intervertebral disc degeneration, lumbar region: Secondary | ICD-10-CM | POA: Diagnosis not present

## 2022-03-23 DIAGNOSIS — Z09 Encounter for follow-up examination after completed treatment for conditions other than malignant neoplasm: Secondary | ICD-10-CM | POA: Diagnosis not present

## 2022-03-23 DIAGNOSIS — M5416 Radiculopathy, lumbar region: Secondary | ICD-10-CM

## 2022-03-23 DIAGNOSIS — M47816 Spondylosis without myelopathy or radiculopathy, lumbar region: Secondary | ICD-10-CM | POA: Diagnosis not present

## 2022-03-23 NOTE — Progress Notes (Signed)
   REFERRING PHYSICIAN:  Kirk Ruths, [facility] [place],   [id number]  DOS: 06/24/21 L4-5 XLIF/PSF  HISTORY OF PRESENT ILLNESS: Joshua Ferrell.   PHYSICAL EXAMINATION:  General: Patient is well developed, well nourished, calm, collected, and in no apparent distress.  He has minor tingling in his feet.  He is otherwise well.   NEUROLOGICAL:  General: In no acute distress.   Awake, alert, oriented to person, place, and time.  Pupils equal round and reactive to light.  Facial tone is symmetric.     Strength:            Side Iliopsoas Quads Hamstring PF DF EHL  R '5 5 5 5 5 5  '$ L '5 5 5 5 5 5   '$ Incisions well healed  No lower lumbar tenderness.   He has tenderness over insertion of hamstrings and along posterior thigh.   Bilateral lower extremity sensation is intact to light touch.    ROS (Neurologic):  Negative except as noted above  IMAGING: Lumbar spine xrays dated 10/30/21 (done on his way out):  Instrumented fusion [id number] with no complications noted. No change from previous xrays.   Xrays March 23, 2022 show no substantial change.  He has a small amount of endplate subsidence which was previously present.  ASSESSMENT/PLAN:  Joshua Ferrell. Has been doing well s/p above surgery.  I am very pleased with his response to surgery.  He is much better than he was prior to surgery.  I will see him back on an as-needed basis.  I spent a total of 10 minutes in this patient's care today. This time was spent reviewing pertinent records including imaging studies, obtaining and confirming history, performing a directed evaluation, formulating and discussing my recommendations, and documenting the visit within the medical record.   Meade Maw MD Department of neurosurgery

## 2022-03-24 DIAGNOSIS — G720 Drug-induced myopathy: Secondary | ICD-10-CM | POA: Diagnosis not present

## 2022-03-24 DIAGNOSIS — I10 Essential (primary) hypertension: Secondary | ICD-10-CM | POA: Diagnosis not present

## 2022-03-24 DIAGNOSIS — E785 Hyperlipidemia, unspecified: Secondary | ICD-10-CM | POA: Diagnosis not present

## 2022-03-24 DIAGNOSIS — E1121 Type 2 diabetes mellitus with diabetic nephropathy: Secondary | ICD-10-CM | POA: Diagnosis not present

## 2022-03-24 DIAGNOSIS — T466X5A Adverse effect of antihyperlipidemic and antiarteriosclerotic drugs, initial encounter: Secondary | ICD-10-CM | POA: Diagnosis not present

## 2022-03-24 DIAGNOSIS — Z0000 Encounter for general adult medical examination without abnormal findings: Secondary | ICD-10-CM | POA: Diagnosis not present

## 2022-03-24 DIAGNOSIS — I2510 Atherosclerotic heart disease of native coronary artery without angina pectoris: Secondary | ICD-10-CM | POA: Diagnosis not present

## 2022-03-24 DIAGNOSIS — I25118 Atherosclerotic heart disease of native coronary artery with other forms of angina pectoris: Secondary | ICD-10-CM | POA: Diagnosis not present

## 2022-04-13 DIAGNOSIS — E785 Hyperlipidemia, unspecified: Secondary | ICD-10-CM | POA: Diagnosis not present

## 2022-04-13 DIAGNOSIS — I2510 Atherosclerotic heart disease of native coronary artery without angina pectoris: Secondary | ICD-10-CM | POA: Diagnosis not present

## 2022-04-13 DIAGNOSIS — Z23 Encounter for immunization: Secondary | ICD-10-CM | POA: Diagnosis not present

## 2022-04-13 DIAGNOSIS — I10 Essential (primary) hypertension: Secondary | ICD-10-CM | POA: Diagnosis not present

## 2022-05-14 DIAGNOSIS — D225 Melanocytic nevi of trunk: Secondary | ICD-10-CM | POA: Diagnosis not present

## 2022-05-14 DIAGNOSIS — Z872 Personal history of diseases of the skin and subcutaneous tissue: Secondary | ICD-10-CM | POA: Diagnosis not present

## 2022-05-14 DIAGNOSIS — D2262 Melanocytic nevi of left upper limb, including shoulder: Secondary | ICD-10-CM | POA: Diagnosis not present

## 2022-05-14 DIAGNOSIS — L821 Other seborrheic keratosis: Secondary | ICD-10-CM | POA: Diagnosis not present

## 2022-05-14 DIAGNOSIS — D2261 Melanocytic nevi of right upper limb, including shoulder: Secondary | ICD-10-CM | POA: Diagnosis not present

## 2022-05-14 DIAGNOSIS — L578 Other skin changes due to chronic exposure to nonionizing radiation: Secondary | ICD-10-CM | POA: Diagnosis not present

## 2022-05-14 DIAGNOSIS — Z08 Encounter for follow-up examination after completed treatment for malignant neoplasm: Secondary | ICD-10-CM | POA: Diagnosis not present

## 2022-05-14 DIAGNOSIS — B353 Tinea pedis: Secondary | ICD-10-CM | POA: Diagnosis not present

## 2022-05-14 DIAGNOSIS — Z85828 Personal history of other malignant neoplasm of skin: Secondary | ICD-10-CM | POA: Diagnosis not present

## 2022-05-14 DIAGNOSIS — D2272 Melanocytic nevi of left lower limb, including hip: Secondary | ICD-10-CM | POA: Diagnosis not present

## 2022-05-14 DIAGNOSIS — D2271 Melanocytic nevi of right lower limb, including hip: Secondary | ICD-10-CM | POA: Diagnosis not present

## 2022-05-17 DIAGNOSIS — Z961 Presence of intraocular lens: Secondary | ICD-10-CM | POA: Diagnosis not present

## 2022-05-17 DIAGNOSIS — H26493 Other secondary cataract, bilateral: Secondary | ICD-10-CM | POA: Diagnosis not present

## 2022-07-31 ENCOUNTER — Ambulatory Visit
Admission: EM | Admit: 2022-07-31 | Discharge: 2022-07-31 | Disposition: A | Discharge status: Home / Self Care | Payer: PPO | Attending: Emergency Medicine | Admitting: Emergency Medicine

## 2022-07-31 DIAGNOSIS — S0501XA Injury of conjunctiva and corneal abrasion without foreign body, right eye, initial encounter: Secondary | ICD-10-CM

## 2022-07-31 MED ORDER — TOBRAMYCIN-DEXAMETHASONE 0.3-0.1 % OP SUSP
2.0000 [drp] | Freq: Four times a day (QID) | OPHTHALMIC | 0 refills | Status: DC
Start: 2022-07-31 — End: 2022-12-23

## 2022-07-31 NOTE — Discharge Instructions (Signed)
Instill 2 drops of TobraDex in your right eye 4 times a day for 5 days.  Abstain from wearing contacts until your symptoms have completely resolved.  When you do resume wearing contacts start with a fresh pair.  Make sure that you are storing your contracts in a clean with clean saline nightly.  Wash your contacts with an enzymatic cleaner once weekly and rinse them thoroughly with new, clean contact lens solution to remove any enzymatic residue.  If you have any worsening of your symptoms such as increased pain, increased redness, increased photosensitivity, or changes in your vision you need to follow-up with your eye doctor.  

## 2022-07-31 NOTE — ED Provider Notes (Signed)
[facility]    CSN: [id number] Arrival date & time: 07/31/22  1510      History   Chief Complaint Chief Complaint  Patient presents with   Foreign Body in Eye    HPI Joshua Ferrell. is a 77 y.o. male.   HPI  77 year old male with a past medical history significant for ST elevation MI, hypertension, diabetes, and hyperlipidemia presents for evaluation of a foreign body in the right eye.  He states that he was working with a skill saw this morning and he thinks he got a piece of sawdust in his eye.  He did attempt to flush his eye multiple times but he still felt a foreign body sensation so he came in for evaluation.  The foreign body sensation is gone now.  He denies any changes to vision.  Past Medical History:  Diagnosis Date   Aortic atherosclerosis (HCC)    Aortic stenosis 06/13/2020   a.)  TTE 06/13/2020: EF 45-50%; mild AS with MPG 5 mmHg; AVA (VTI) = 1.94 cm.   Arthritis    Coronary artery disease    a.) Inferior STEMI 06/24/2000 -> PCI placing stent (unknown type) to mRCA. b.) NSTEMI 06/12/2020 --> LHC 06/12/2020: 100% oLAD, 95% o-pLAD, 70% 2nd LPL, 70% 3rd LPL, 30% pLCx, 50% mLAD --> PCI placing a 2.75 x 15 mm Resolute Onyx DES to o-pLAD.   Diastolic dysfunction    a.)  TTE 06/13/2020: EF 45-50%; G1DD.   DOE (dyspnea on exertion)    GERD (gastroesophageal reflux disease)    Headache(784.0)    History of 2019 novel coronavirus disease (COVID-19) 12/31/2018   History of blood transfusion 1990   internal injuries after a fall requiring blood   History of colonic polyps    Hyperlipidemia    Hypertension    Long term current use of antithrombotics/antiplatelets    a.) clopidogrel   NSTEMI (non-ST elevated myocardial infarction) (HCC) 06/12/2020   a.) LHC 06/12/2020: 100% oLAD, 95% o-pLAD, 70% 2nd LPL, 70% 3rd LPL, 30% pLCx, 50% mLAD --> PCI placing a 2.75 x 15 mm Resolute Onyx DES to o-pLAD.   PONV (postoperative nausea and vomiting)    one time  only (surgery in 1990)   ST elevation myocardial infarction (STEMI) of inferior wall (HCC) 06/24/2000   a.) PCI 06/24/2000 placing stent (unknown type) to mRCA   Statin myopathy    T2DM (type 2 diabetes mellitus) [facility])     Patient Active Problem List   Diagnosis Date Noted   S/P lumbar fusion 06/24/2021   NSTEMI (non-ST elevation myocardial infarction) (HCC) 06/23/2020   Acute ST elevation myocardial infarction (STEMI) of lateral wall (HCC) 06/12/2020   Coronary artery disease    Hypertension    Diabetes mellitus without complication (HCC)    History of stroke 04/07/2020   Lumbar stenosis 10/30/2018   Bursitis of right shoulder 04/01/2017   History of acute inferior wall myocardial infarction 08/22/2013   Hyperlipemia 08/22/2013    Past Surgical History:  Procedure Laterality Date   ANTERIOR LATERAL LUMBAR FUSION WITH PERCUTANEOUS SCREW 1 LEVEL N/A 06/24/2021   Procedure: L4-5 LATERAL LUMBAR INTERBODY FUSION AND POSTERIOR SPINAL FUSION;  Surgeon: Venetia Night, MD;  Location: [facility];  Service: Neurosurgery;  Laterality: N/A;   APPLICATION OF INTRAOPERATIVE CT SCAN N/A 06/24/2021   Procedure: APPLICATION OF INTRAOPERATIVE CT SCAN;  Surgeon: Venetia Night, MD;  Location: [facility];  Service: Neurosurgery;  Laterality: N/A;   CATARACT EXTRACTION W/PHACO Right 08/13/2015  Procedure: CATARACT EXTRACTION PHACO AND INTRAOCULAR LENS PLACEMENT (IOC);  Surgeon: Sallee Lange, MD;  Location: [facility];  Service: Ophthalmology;  Laterality: Right;  [place] 01:08 AP% 24.9 CDE 32.40 fluid pack lot # [id number]   CATARACT EXTRACTION W/PHACO Left 04/23/2020   Procedure: CATARACT EXTRACTION PHACO AND INTRAOCULAR LENS PLACEMENT (IOC)LEFT DIABETIC 14.14 02:15.2 10.5%;  Surgeon: Lockie Mola, MD;  Location: [facility];  Service: Ophthalmology;  Laterality: Left;  Diabetic - oral meds   COLONOSCOPY     COLONOSCOPY WITH PROPOFOL N/A 08/24/2017   Procedure: COLONOSCOPY WITH  PROPOFOL;  Surgeon: Scot Jun, MD;  Location: [facility];  Service: Endoscopy;  Laterality: N/A;   CORONARY ANGIOPLASTY WITH STENT PLACEMENT Left 06/24/2000   Procedure: CORONARY ANGIOPLASTY WITH STENT PLACEMENT; Location: [facility]   CORONARY/GRAFT ACUTE MI REVASCULARIZATION N/A 06/12/2020   Procedure: Coronary/Graft Acute MI Revascularization;  Surgeon: Marcina Millard, MD;  Location: [facility];  Service: Cardiovascular;  Laterality: N/A;   LEFT HEART CATH AND CORONARY ANGIOGRAPHY N/A 06/12/2020   Procedure: LEFT HEART CATH AND CORONARY ANGIOGRAPHY;  Surgeon: Marcina Millard, MD;  Location: [facility];  Service: Cardiovascular;  Laterality: N/A;   LUMBAR LAMINECTOMY/ DECOMPRESSION WITH MET-RX N/A 10/30/2018   Procedure: L4-5 LUMBAR LAMINECTOMY;  Surgeon: Lucy Chris, MD;  Location: [facility];  Service: Neurosurgery;  Laterality: N/A;   LUMBAR LAMINECTOMY/DECOMPRESSION MICRODISCECTOMY Bilateral 10/23/2012   Procedure: LUMBAR LAMINECTOMY/DECOMPRESSION MICRODISCECTOMY 1 LEVEL;  Surgeon: Clydene Fake, MD;  Location: [facility];  Service: Neurosurgery;  Laterality: Bilateral;  LUMBAR LAMINECTOMY/DECOMPRESSION MICRODISCECTOMY 1 LEVEL   LUMBAR LAMINECTOMY/DECOMPRESSION MICRODISCECTOMY N/A 06/24/2021   Procedure: LEFT L5-S1 LAMINOFORAMINOTOMY;  Surgeon: Venetia Night, MD;  Location: [facility];  Service: Neurosurgery;  Laterality: N/A;   PENILE PROSTHESIS IMPLANT  1994   x3   SUPRAPUBIC CATHETER PLACEMENT     and removal   URETHERAL RE-IMPLANTATION         Home Medications    Prior to Admission medications   Medication Sig Start Date End Date Taking? Authorizing Provider  acetaminophen (TYLENOL) 650 MG CR tablet Take 1,300 mg by mouth 2 (two) times daily.   Yes [provider]  amLODipine (NORVASC) 5 MG tablet Take 5 mg by mouth every morning.   Yes [provider]  aspirin EC 81 MG tablet Take 81 mg by mouth every morning.   Yes  [provider]  glimepiride (AMARYL) 2 MG tablet Take 2 mg by mouth daily with breakfast.   Yes [provider]  Investigational - Study Medication Take 14 mg by mouth daily before breakfast. Study name: semaglutide 14 mg or placebo   Yes [provider]  metFORMIN (GLUCOPHAGE-XR) 500 MG 24 hr tablet Take 1,000 mg by mouth 2 (two) times daily. 03/11/21  Yes [provider]  metoprolol succinate (TOPROL-XL) 50 MG 24 hr tablet Take 1 tablet (50 mg total) by mouth daily. Take with or immediately following a meal. Patient taking differently: Take 100 mg by mouth every morning. Take with or immediately following a meal. 06/15/20  Yes Memon, Durward Mallard, MD  nitroGLYCERIN (NITROSTAT) 0.4 MG SL tablet Place 0.4 mg under the tongue every 5 (five) minutes as needed for chest pain.   Yes [provider]  tobramycin-dexamethasone Wallene Dales) ophthalmic solution Place 2 drops into the right eye every 6 (six) hours. 07/31/22  Yes Becky Augusta, NP  valsartan-hydrochlorothiazide (DIOVAN-HCT) 160-12.5 MG tablet Take 1 tablet by mouth every morning.   Yes [provider]  Family History Family History  Problem Relation Age of Onset   CAD Mother    Heart attack Father     Social History Social History   Tobacco Use   Smoking status: Never   Smokeless tobacco: Former    Types: Engineer, drilling   Vaping status: Never Used  Substance Use Topics   Alcohol use: Yes    Alcohol/week: 5.0 standard drinks of alcohol    Types: 5 Cans of beer per week    Comment: not every day but few times per week   Drug use: No     Allergies   Statins and Celebrex [celecoxib]   Review of Systems Review of Systems  Eyes:  Positive for pain. Negative for photophobia, discharge, redness, itching and visual disturbance.     Physical Exam Triage Vital Signs ED Triage Vitals  Encounter Vitals Group     BP      Systolic BP Percentile      Diastolic BP Percentile       Pulse      Resp      Temp      Temp src      SpO2      Weight      Height      Head Circumference      Peak Flow      Pain Score      Pain Loc      Pain Education      Exclude from Growth Chart    No data found.  Updated Vital Signs BP (!) 158/79 (BP Location: Left Arm)   Pulse 76   Temp 98.8 F (37.1 C) (Oral)   Resp 16   Ht 5\' 9"  (1.753 m)   Wt 203 lb (92.1 kg)   SpO2 95%   BMI 29.98 kg/m   Visual Acuity Right Eye Distance:   Left Eye Distance:   Bilateral Distance:    Right Eye Near:   Left Eye Near:    Bilateral Near:     Physical Exam Vitals and nursing note reviewed.  Constitutional:      Appearance: Normal appearance. He is not ill-appearing.  HENT:     Head: Normocephalic and atraumatic.  Eyes:     General: No scleral icterus.       Right eye: No discharge.        Left eye: No discharge.     Extraocular Movements: Extraocular movements intact.     Conjunctiva/sclera: Conjunctivae normal.     Pupils: Pupils are equal, round, and reactive to light.  Neurological:     Mental Status: He is alert.      UC Treatments / Results  Labs (all labs ordered are listed, but only abnormal results are displayed) Labs Reviewed - No data to display  EKG   Radiology No results found.  Procedures Procedures (including critical care time)  Medications Ordered in UC Medications - No data to display  Initial Impression / Assessment and Plan / UC Course  I have reviewed the triage vital signs and the nursing notes.  Pertinent labs & imaging results that were available during my care of the patient were reviewed by me and considered in my medical decision making (see chart for details).   Patient is a very pleasant, nontoxic-appearing 77 year old male presenting for evaluation of foreign body in the right eye as outlined in HPI above.  On visual inspection I do not see a foreign body either on the  globe or under the upper eyelid with eversion.  The  eye was anesthetized using 2 drops of tetracaine and fluorescein dye was instilled in the eye.  With examination under Woods lamp the patient does have a corneal abrasion in the superior lateral quadrant of the left eye.  I will treat the corneal abrasion with TobraDex, 2 drops in his right eye 4 times a day x 5 days.  Return precautions reviewed.   Final Clinical Impressions(s) / UC Diagnoses   Final diagnoses:  Abrasion of right cornea, initial encounter     Discharge Instructions      Instill 2 drops of TobraDex in your right eye 4 times a day for 5 days.  Abstain from wearing contacts until your symptoms have completely resolved.  When you do resume wearing contacts start with a fresh pair.  Make sure that you are storing your contracts in a clean with clean saline nightly.  Wash your contacts with an enzymatic cleaner once weekly and rinse them thoroughly with new, clean contact lens solution to remove any enzymatic residue.  If you have any worsening of your symptoms such as increased pain, increased redness, increased photosensitivity, or changes in your vision you need to follow-up with your eye doctor.      ED Prescriptions     Medication Sig Dispense Auth. Provider   tobramycin-dexamethasone [facility]) ophthalmic solution Place 2 drops into the right eye every 6 (six) hours. 5 mL Becky Augusta, NP      PDMP not reviewed this encounter.   Becky Augusta, NP 07/31/22 1536

## 2022-07-31 NOTE — ED Triage Notes (Signed)
Pt c/o foreign body in R eye that occurred this AM. States has washed eye out with success just coming in to ensure nothing in eye. Denies any pain or blurred vision.

## 2022-09-16 DIAGNOSIS — M70811 Other soft tissue disorders related to use, overuse and pressure, right shoulder: Secondary | ICD-10-CM | POA: Diagnosis not present

## 2022-09-16 DIAGNOSIS — M159 Polyosteoarthritis, unspecified: Secondary | ICD-10-CM | POA: Diagnosis not present

## 2022-09-16 DIAGNOSIS — M25511 Pain in right shoulder: Secondary | ICD-10-CM | POA: Diagnosis not present

## 2022-09-16 DIAGNOSIS — M7551 Bursitis of right shoulder: Secondary | ICD-10-CM | POA: Diagnosis not present

## 2022-09-16 DIAGNOSIS — M19011 Primary osteoarthritis, right shoulder: Secondary | ICD-10-CM | POA: Diagnosis not present

## 2022-09-17 DIAGNOSIS — I2510 Atherosclerotic heart disease of native coronary artery without angina pectoris: Secondary | ICD-10-CM | POA: Diagnosis not present

## 2022-09-17 DIAGNOSIS — E1121 Type 2 diabetes mellitus with diabetic nephropathy: Secondary | ICD-10-CM | POA: Diagnosis not present

## 2022-09-17 DIAGNOSIS — I10 Essential (primary) hypertension: Secondary | ICD-10-CM | POA: Diagnosis not present

## 2022-09-24 DIAGNOSIS — I2510 Atherosclerotic heart disease of native coronary artery without angina pectoris: Secondary | ICD-10-CM | POA: Diagnosis not present

## 2022-09-24 DIAGNOSIS — I10 Essential (primary) hypertension: Secondary | ICD-10-CM | POA: Diagnosis not present

## 2022-09-24 DIAGNOSIS — G720 Drug-induced myopathy: Secondary | ICD-10-CM | POA: Diagnosis not present

## 2022-09-24 DIAGNOSIS — T466X5A Adverse effect of antihyperlipidemic and antiarteriosclerotic drugs, initial encounter: Secondary | ICD-10-CM | POA: Diagnosis not present

## 2022-09-24 DIAGNOSIS — E1121 Type 2 diabetes mellitus with diabetic nephropathy: Secondary | ICD-10-CM | POA: Diagnosis not present

## 2022-10-09 DIAGNOSIS — I499 Cardiac arrhythmia, unspecified: Secondary | ICD-10-CM | POA: Diagnosis not present

## 2022-10-09 DIAGNOSIS — R000 Tachycardia, unspecified: Secondary | ICD-10-CM | POA: Diagnosis not present

## 2022-10-09 DIAGNOSIS — R0689 Other abnormalities of breathing: Secondary | ICD-10-CM | POA: Diagnosis not present

## 2022-10-09 DIAGNOSIS — R001 Bradycardia, unspecified: Secondary | ICD-10-CM | POA: Diagnosis not present

## 2022-10-19 DEATH — deceased
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
